# Patient Record
Sex: Female | Born: 1970 | Race: Black or African American | Hispanic: No | Marital: Married | Smoking: Never smoker
Health system: Southern US, Community
[De-identification: ages and names within clinical notes are randomized; demographics above are authoritative.]

## PROBLEM LIST (undated history)

## (undated) DIAGNOSIS — A048 Other specified bacterial intestinal infections: Secondary | ICD-10-CM

## (undated) DIAGNOSIS — I10 Essential (primary) hypertension: Secondary | ICD-10-CM

## (undated) DIAGNOSIS — D649 Anemia, unspecified: Secondary | ICD-10-CM

## (undated) DIAGNOSIS — Z9289 Personal history of other medical treatment: Secondary | ICD-10-CM

## (undated) DIAGNOSIS — R1011 Right upper quadrant pain: Secondary | ICD-10-CM

## (undated) DIAGNOSIS — K760 Fatty (change of) liver, not elsewhere classified: Secondary | ICD-10-CM

## (undated) HISTORY — PX: UPPER GASTROINTESTINAL ENDOSCOPY: SHX188

## (undated) HISTORY — DX: Anemia, unspecified: D64.9

## (undated) HISTORY — DX: Personal history of other medical treatment: Z92.89

## (undated) HISTORY — DX: Other specified bacterial intestinal infections: A04.8

## (undated) HISTORY — DX: Essential (primary) hypertension: I10

## (undated) HISTORY — DX: Fatty (change of) liver, not elsewhere classified: K76.0

## (undated) HISTORY — PX: NO PAST SURGERIES: SHX2092

---

## 2004-12-25 ENCOUNTER — Inpatient Hospital Stay (HOSPITAL_COMMUNITY): Admission: AD | Admit: 2004-12-25 | Discharge: 2004-12-25 | Payer: Self-pay | Admitting: *Deleted

## 2004-12-25 ENCOUNTER — Ambulatory Visit: Payer: Self-pay | Admitting: Certified Nurse Midwife

## 2005-03-05 ENCOUNTER — Inpatient Hospital Stay (HOSPITAL_COMMUNITY): Admission: AD | Admit: 2005-03-05 | Discharge: 2005-03-05 | Payer: Self-pay | Admitting: Obstetrics & Gynecology

## 2005-03-20 ENCOUNTER — Inpatient Hospital Stay (HOSPITAL_COMMUNITY): Admission: AD | Admit: 2005-03-20 | Discharge: 2005-03-23 | Payer: Self-pay | Admitting: Obstetrics & Gynecology

## 2007-05-07 ENCOUNTER — Ambulatory Visit: Payer: Self-pay | Admitting: Cardiology

## 2007-05-07 ENCOUNTER — Encounter: Payer: Self-pay | Admitting: Cardiology

## 2007-05-07 ENCOUNTER — Emergency Department (HOSPITAL_COMMUNITY): Admission: EM | Admit: 2007-05-07 | Discharge: 2007-05-07 | Payer: Self-pay | Admitting: Emergency Medicine

## 2007-05-20 ENCOUNTER — Ambulatory Visit: Payer: Self-pay | Admitting: Cardiology

## 2007-05-20 LAB — CONVERTED CEMR LAB
CO2: 27 meq/L (ref 19–32)
Chloride: 104 meq/L (ref 96–112)
GFR calc Af Amer: 104 mL/min
GFR calc non Af Amer: 86 mL/min
Glucose, Bld: 82 mg/dL (ref 70–99)
Sodium: 138 meq/L (ref 135–145)

## 2008-04-21 ENCOUNTER — Ambulatory Visit: Payer: Self-pay | Admitting: Cardiology

## 2009-01-13 ENCOUNTER — Encounter: Admission: RE | Admit: 2009-01-13 | Discharge: 2009-01-13 | Payer: Self-pay | Admitting: Emergency Medicine

## 2009-04-23 ENCOUNTER — Ambulatory Visit (HOSPITAL_COMMUNITY): Admission: RE | Admit: 2009-04-23 | Discharge: 2009-04-23 | Payer: Self-pay | Admitting: Obstetrics & Gynecology

## 2009-04-29 DIAGNOSIS — R0789 Other chest pain: Secondary | ICD-10-CM | POA: Insufficient documentation

## 2009-04-29 DIAGNOSIS — I10 Essential (primary) hypertension: Secondary | ICD-10-CM | POA: Insufficient documentation

## 2009-05-04 ENCOUNTER — Ambulatory Visit: Payer: Self-pay | Admitting: Cardiology

## 2009-07-20 ENCOUNTER — Ambulatory Visit (HOSPITAL_COMMUNITY): Admission: RE | Admit: 2009-07-20 | Discharge: 2009-07-20 | Payer: Self-pay | Admitting: Obstetrics & Gynecology

## 2009-10-19 ENCOUNTER — Ambulatory Visit (HOSPITAL_COMMUNITY): Admission: RE | Admit: 2009-10-19 | Discharge: 2009-10-19 | Payer: Self-pay | Admitting: Obstetrics & Gynecology

## 2010-03-29 ENCOUNTER — Ambulatory Visit: Payer: Self-pay | Admitting: Cardiology

## 2010-03-29 DIAGNOSIS — E663 Overweight: Secondary | ICD-10-CM | POA: Insufficient documentation

## 2010-03-29 DIAGNOSIS — R002 Palpitations: Secondary | ICD-10-CM | POA: Insufficient documentation

## 2010-04-12 ENCOUNTER — Ambulatory Visit: Payer: Self-pay

## 2010-04-12 ENCOUNTER — Encounter: Payer: Self-pay | Admitting: Cardiology

## 2010-04-12 ENCOUNTER — Ambulatory Visit (HOSPITAL_COMMUNITY): Admission: RE | Admit: 2010-04-12 | Discharge: 2010-04-12 | Payer: Self-pay | Admitting: Cardiology

## 2010-04-12 ENCOUNTER — Ambulatory Visit: Payer: Self-pay | Admitting: Internal Medicine

## 2010-04-14 ENCOUNTER — Encounter: Admission: RE | Admit: 2010-04-14 | Discharge: 2010-04-14 | Payer: Self-pay | Admitting: Internal Medicine

## 2010-06-09 ENCOUNTER — Inpatient Hospital Stay (HOSPITAL_COMMUNITY): Admission: AD | Admit: 2010-06-09 | Discharge: 2009-12-14 | Payer: Self-pay | Admitting: Obstetrics & Gynecology

## 2010-07-24 ENCOUNTER — Encounter: Payer: Self-pay | Admitting: Obstetrics

## 2010-08-02 NOTE — Assessment & Plan Note (Signed)
Summary: rov Lonell Grandchild  Medications Added METHYLDOPA 500 MG TABS (METHYLDOPA) 1 tab two times a day TARON-C DHA 53.5-38-1 MG CAPS (PRENAT-FEFUM-FEPO-FA-OMEGA 3) 1 cap once daily      Allergies Added: NKDA  Visit Type:  rov Primary Provider:  [facility]  CC:  no cardiac complaints today.  History of Present Illness: [name] returns today for treatment of her hypertension. She now has a 24-month-old who is very healthy.  Her biggest complaint is occasional palpitations which occur sporadically. She denies any chest pain in fact her chest pain is improved. She denies orthopnea, PND or edema. She has had no syncope or presyncope.  She still quite overweight.  Current Medications (verified): 1)  Methyldopa 500 Mg Tabs (Methyldopa) .Marland Kitchen.. 1 Tab Two Times A Day 2)  Taron-C Dha 53.5-38-1 Mg Caps (Prenat-Fefum-Fepo-Fa-Omega 3) .Marland Kitchen.. 1 Cap Once Daily  Allergies (verified): No Known Drug Allergies  Past History:  Past Medical History: Last updated: 04/29/2009 CHEST PAIN, ATYPICAL (ICD-786.59) HYPERTENSION (ICD-401.9)    Past Surgical History: Last updated: 04/29/2009 none to report  Family History: Last updated: 04/29/2009 Her mother is alive in her 74s with no history of  coronary artery disease.  Her father died around the time she was born, and she has no information on him.  She has one brother that is living,  and she says he has fluid around his heart.        Social History: Last updated: 04/29/2009 She lives in [place] with her cousin and children  and works as a Engineering geologist.  She denies any history of alcohol,  tobacco, or drug abuse.      Review of Systems       negative other than history of present illness  Vital Signs:  Patient profile:   40 year old female Height:      68 inches Weight:      232.8 pounds BMI:     35.53 Pulse rate:   89 / minute Pulse rhythm:   irregular BP sitting:   130 / 80  (left arm) Cuff size:    large  Vitals Entered By: Danielle Rankin, CMA (March 29, 2010 12:31 PM)  Physical Exam  General:  obese.   Head:  normocephalic and atraumatic Eyes:  PERRLA/EOM intact; conjunctiva and lids normal. Neck:  Neck supple, no JVD. No masses, thyromegaly or abnormal cervical nodes. Chest Blakeleigh Domek:  no deformities or breast masses noted Lungs:  Clear bilaterally to auscultation and percussion. Heart:  PMI difficult to appreciate, normal S1-S2, regular rate and rhythm, no gallop, carotids full without bruits Abdomen:  obese, positive bowel sounds, difficult to assess Msk:  Back normal, normal gait. Muscle strength and tone normal. Pulses:  pulses normal in all 4 extremities Extremities:  No clubbing or cyanosis. no edema Neurologic:  Alert and oriented x 3. Skin:  Intact without lesions or rashes. Psych:  Normal affect.   Problems:  Medical Problems Added: 1)  Dx of Overweight/obesity  (ICD-278.02) 2)  Dx of Palpitations  (ICD-785.1)  EKG  Procedure date:  03/29/2010  Findings:      sinus rhythm with PVCs unifocal, right atrial enlargement, possible left atrial enlargement, low voltage QRS, ST segment changes that are nonspecific but localized in the inferior lateral leads.  Impression & Recommendations:  Problem # 1:  CHEST PAIN, ATYPICAL (ICD-786.59) Assessment Improved  Problem # 2:  OVERWEIGHT/OBESITY (ICD-278.02) Assessment: Unchanged  Orders: EKG w/ Interpretation (93000) Echocardiogram (Echo)  Problem # 3:  PALPITATIONS (ICD-785.1) Assessment: New I suspect these are all PVCs based on her EKG. We'll check echocardiogram. She has normal left ventricular function wiil  treat conservatively. This was explained to the patient and her husband.  Problem # 4:  HYPERTENSION (ICD-401.9)  Her updated medication list for this problem includes:    Methyldopa 500 Mg Tabs (Methyldopa) .Marland Kitchen... 1 tab two times a day  Orders: EKG w/ Interpretation (93000) Echocardiogram  (Echo)  Patient Instructions: 1)  Your physician recommends that you schedule a follow-up appointment in: 12 months 2)  Your physician recommends that you continue on your current medications as directed. Please refer to the Current Medication list given to you today. 3)  Your physician has requested that you have an echocardiogram.  Echocardiography is a painless test that uses sound waves to create images of your heart. It provides your doctor with information about the size and shape of your heart and how well your heart's chambers and valves are working.  This procedure takes approximately one hour. There are no restrictions for this procedure.

## 2010-09-19 LAB — CBC
HCT: 33.2 % — ABNORMAL LOW (ref 36.0–46.0)
Hemoglobin: 11.5 g/dL — ABNORMAL LOW (ref 12.0–15.0)
MCHC: 34.5 g/dL (ref 30.0–36.0)
MCHC: 34.7 g/dL (ref 30.0–36.0)
MCV: 99.6 fL (ref 78.0–100.0)
Platelets: 144 10*3/uL — ABNORMAL LOW (ref 150–400)
Platelets: 170 10*3/uL (ref 150–400)
RBC: 3.17 MIL/uL — ABNORMAL LOW (ref 3.87–5.11)
RDW: 13 % (ref 11.5–15.5)
RDW: 13 % (ref 11.5–15.5)

## 2010-11-15 NOTE — Consult Note (Signed)
[name] [name], [name] [name] NO.:  [id number]   MEDICAL RECORD NO.:  [id number]          PATIENT TYPE:  EMS   LOCATION:  [facility]                         FACILITY:  [facility]   PHYSICIAN:  [name] C. Wall, MD, FACCDATE OF BIRTH:  Jul 16, 1970   DATE OF CONSULTATION:  05/07/2007  DATE OF DISCHARGE:                                 CONSULTATION   ER CONSULT   PRIMARY MD:  [facility].   PRIMARY CARDIOLOGIST:  New.   CHIEF COMPLAINT:  Chest pain.   HISTORY OF PRESENT ILLNESS:  [name] [name] is a 40 year old female from [place]  who had chest pain in January of 2008 and was treated with ibuprofen and  her symptoms resolved.  She had an upper respiratory infection about a  month ago which was again treated with medication including antibiotics,  and her symptoms have improved.  The chest pain began again about a week  ago.  It is relieved by ibuprofen, as it was in January; but it returns  when the ibuprofen wears off.  It reaches a 10/10.  It is described as  sharp.  Last p.m., the symptoms were more bothersome to her and she felt  a little short of breath.  She feels that she did not sleep very much.  She went to Urgent Care this morning, and because of her chest pain and  trigeminy on telemetry she was sent to the emergency room.   The pain is consistent except when she takes the ibuprofen.  It is  unchanged by cough or deep inspiration, but it is better with a prone  position.  She has not tried any other medications.  Currently, she is  resting comfortably.   PAST MEDICAL HISTORY:  1. Hypertension x2 years.  2. Hyperlipidemia per the patient, but no treatment.  3. History of hepatitis B.   SURGICAL HISTORY:  None.   ALLERGIES:  No known drug allergies.   CURRENT MEDICATIONS:  Lisinopril/hydrochlorothiazide 20/12.5 daily.   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  She denies any history of alcohol,  tobacco, or drug abuse.   FAMILY HISTORY:  Her mother is alive in her 75s with no history of  coronary artery disease.  Her father died around the time she was born,  and she has no information on him.  She has one brother that is living,  and she says he has fluid around his heart.   REVIEW OF SYSTEMS:  Since her upper respiratory infection got better,  she has had no fevers, chills, or sweats.  She does not get chronic  headaches.  She has some chronic dyspnea on exertion, but this is not  changed recently.  The chest pain described above.  She does not have  any presyncope or syncope and has never had palpitations despite  frequent PVCs and trigeminy on telemetry.  She has occasional reflux  symptoms and heartburn, but is not currently having those and those are  different from the pain that she has been having.  Full 14-point  review  of systems is otherwise negative.   PHYSICAL EXAM:  VITAL SIGNS:  Temperature is 97.6, blood pressure  132/85, pulse 89, respiratory rate 18, O2 saturation 100% on room air.  GENERAL:  She is a well-developed, obese African female in no acute  distress.  HEENT:  Normal.  NECK:  There is no lymphadenopathy, thyromegaly, bruit, or JVD noted.  CV:  Her heart is regular in rate and rhythm with an S1, S2; and a soft  systolic murmur is noted.  There is no rub and no gallop noted.  Distal  pulses are intact in all 4 extremities, and no femoral bruits are  appreciated.  LUNGS:  Essentially clear to auscultation bilaterally, but she has one  spot on the left side of her chest just above her left breast that is  tender to palpation.  SKIN:  No rashes or lesions are noted.  ABDOMEN:  Soft and nontender with active bowel sounds.  EXTREMITIES:  There is no cyanosis, clubbing, or edema noted.  MUSCULOSKELETAL:  There is no joint deformity or effusion, and no spine  or CVA tenderness is noted.  NEURO:  She is alert and oriented with cranial nerves II through  XII  grossly intact.   Chest x-ray and laboratory values are pending.   EKG is sinus rhythm with PVCs and no acute ischemic changes.  No old is  available for comparison.   IMPRESSION:  1. Chest pain:  Her symptoms are most consistent with musculoskeletal      pain.  We will continue the nonsteroidals.  We will check cardiac      enzymes as well as a sed rate, a TSH, and she will also need an      echocardiogram.  If her symptoms improve with nonsteroidals and her      echocardiogram is without significant pericardial effusion, no      further inpatient workup is indicated at this time and she can be      followed in the office.  2. Trigeminy:  She is on a diuretic for blood pressure control without      potassium supplementation; so we will check a magnesium and a      potassium level.  She has no palpitations, no history of presyncope      or syncope.  Therefore, if her EF is within normal limits, no      further evaluation is indicated.  Metoprolol extended release 25 mg      can be added to her medication regimen, and she can be followed in      the office.      [name] Demark, PA-C      [name] Sans. Daleen Squibb, MD, [facility]  Electronically Signed    RB/MEDQ  D:  05/07/2007  T:  05/08/2007  Job:  [id number]   cc:   [facility]

## 2010-11-15 NOTE — Assessment & Plan Note (Signed)
[facility]                            CARDIOLOGY OFFICE NOTE   NAME:[name], [name]                       MRN:          [id number]  DATE:05/20/2007                            DOB:          April 07, 1971    The patient returns today after being seen in the emergency room with  chest discomfort.  She feels remarkably better after being on ibuprofen  for about 10 days.   She has a history of hypertension.   We obtained a 2D echocardiogram which shows mild left ventricular  hypertrophy with normal left ventricular systolic function.  She had a  trivial pericardial effusion posteriorly which we did not feel was  clinically important.   Regardless, she has gotten better.  Her EKG is normal.   Her biggest complaint is persistent dry cough.  She has been on  antibiotics for a respiratory tract infection which she stopped about a  month ago.   Note that her chest x-ray demonstrated no acute cardiopulmonary disease  when we saw her in the emergency room on May 07, 2007.   Her chronic meds are:  1. Lisinopril/hydrochlorothiazide 20/12.5 daily.  2. Ibuprofen 800 mg p.o. t.i.d.  3. Metoprolol extended release 25 mg daily.   She looks remarkably good today.  She is in no acute distress.  Her blood pressure is 113/83, her pulse is 82 and regular.  HEENT:  Unremarkable and unchanged.  Carotid upstrokes were equal bilaterally without bruits, no JVD.  Thyroid is not enlarged, trachea is midline.  LUNGS:  Clear.  No rhonchi or wheezes.  HEART:  Reveals a poorly appreciated PMI, there is no rub.  Normal S1,  S2.  ABDOMINAL EXAM:  Soft, good bowel sounds.  EXTREMITIES:  Reveal no edema.  Pulses are intact, there is no sign of  DVT.   ASSESSMENT AND PLAN:  1. Chest discomfort most likely musculoskeletal, resolved now after 10      to 12 days of ibuprofen.  We will discontinue the ibuprofen.  2. Hypertension with mild left ventricular hypertrophy.  Her  pressure      is good today.  3. Persistent, dry cough.  If this continues I have advised her to      follow up with the [facility] for possible      change of her lisinopril to a different agent.   Please note that her potassium was 3.4 in emergency room.  Will check a  potassium level today.  I will see her back on a p.r.n. basis.     Thomas C. Daleen Squibb, MD, [facility]  Electronically Signed    TCW/MedQ  DD: 05/20/2007  DT: 05/21/2007  Job #: [id number]

## 2010-11-15 NOTE — Assessment & Plan Note (Signed)
[facility]                            CARDIOLOGY OFFICE NOTE   NAME:[name], [name]                       MRN:          [id number]  DATE:04/21/2008                            DOB:          [birth year]    [name] [name] returns today for followup of her hypertension and history of  atypical chest pain.  She has no complaints except for some sharp  stabbing pain when she walks too fast.  It is well localized on left  sternal border just above her left breast.  She denies any heaviness,  pressure, shortness of breath, or radiation into her shoulder or arm.   She is currently taking lisinopril/hydrochlorothiazide 20/12.5 mg daily  and metoprolol extended release 25 mg a day.   Her blood pressure today is 108/80, her pulse is 72 and regular, weight  is 222.  HEENT is normal.  Neck was difficult to assess JVD.  Carotids  upstrokes were equal bilateral without bruits.  No thyromegaly.  Her  lungs are clear to auscultation or percussion.  Heart reveals soft S1  and S2.  PMI could not be appreciated.  Abdominal exam is obese.  Extremities show no edema.  Pulses are present.  Neuro exam is intact.   ASSESSMENT AND PLAN:  [name] [name] is doing well.  I have made no changes  in medical program.  We renewed her antihypertensives, which seem to be  doing really well for another year.  We will see her back at that time.     Thomas C. Daleen Squibb, MD, [facility]  Electronically Signed    TCW/MedQ  DD: 04/21/2008  DT: 04/22/2008  Job #: [id number]

## 2011-04-11 LAB — CK TOTAL AND CKMB (NOT AT ARMC)
CK, MB: 2.8
Relative Index: 1.2
Total CK: 239 — ABNORMAL HIGH

## 2011-04-11 LAB — COMPREHENSIVE METABOLIC PANEL
ALT: 19
AST: 21
Albumin: 3.8
Alkaline Phosphatase: 62
BUN: 6
CO2: 25
Calcium: 9.2
Chloride: 106
Creatinine, Ser: 0.66
GFR calc Af Amer: >60
GFR calc non Af Amer: >60
Glucose, Bld: 79
Potassium: 3.4 — ABNORMAL LOW
Sodium: 141
Total Bilirubin: 0.7
Total Protein: 7.2

## 2011-04-11 LAB — DIFFERENTIAL
Basophils Absolute: 0
Basophils Relative: 0
Eosinophils Absolute: 0
Monocytes Relative: 4
Neutro Abs: 4
Neutrophils Relative %: 72

## 2011-04-11 LAB — TROPONIN I: Troponin I: 0.03

## 2011-04-11 LAB — LIPID PANEL
Cholesterol: 186
LDL Cholesterol: 103 — ABNORMAL HIGH
Total CHOL/HDL Ratio: 2.5

## 2011-04-11 LAB — CBC
HCT: 35.7 — ABNORMAL LOW
Hemoglobin: 12
MCHC: 33.6
MCV: 92.2
Platelets: 206
RBC: 3.87
RDW: 11.9
WBC: 5.5

## 2011-04-11 LAB — TSH: TSH: 0.092 — ABNORMAL LOW

## 2011-04-11 LAB — D-DIMER, QUANTITATIVE: D-Dimer, Quant: <0.22

## 2011-04-11 LAB — SEDIMENTATION RATE: Sed Rate: 20

## 2011-05-16 ENCOUNTER — Ambulatory Visit: Payer: Self-pay | Admitting: Cardiology

## 2011-05-24 ENCOUNTER — Encounter: Payer: Self-pay | Admitting: *Deleted

## 2011-05-30 ENCOUNTER — Encounter: Payer: Self-pay | Admitting: Cardiology

## 2011-05-30 ENCOUNTER — Ambulatory Visit (INDEPENDENT_AMBULATORY_CARE_PROVIDER_SITE_OTHER): Payer: 59 | Admitting: Cardiology

## 2011-05-30 VITALS — BP 172/118 | HR 83 | Ht 65.0 in | Wt 245.0 lb

## 2011-05-30 DIAGNOSIS — I10 Essential (primary) hypertension: Secondary | ICD-10-CM

## 2011-05-30 MED ORDER — METOPROLOL TARTRATE 25 MG PO TABS: 25.0000 mg | ORAL_TABLET | Freq: Two times a day (BID) | ORAL | Status: DC

## 2011-05-30 NOTE — Assessment & Plan Note (Signed)
This is the most significant problem. Diastolic I'm talking to her about the risk of diabetes, other end organ damage from poor blood pressure control as well as orthopedic issues.  We have given her a low-carb diet, encouraged to lose as much weight as possible and keep it off, Octavion Mollenkopf daily as she is doing, and 2 g sodium diet to help her blood pressures well.

## 2011-05-30 NOTE — Assessment & Plan Note (Signed)
Poorly controlled. 2 g sodium diet, continual daily, major weight loss, and increase metoprolol tartrate 2 inappropriate 25 mg twice a day frequency dosing. She will probably need further adjustment of blood pressure meds with follow up with  her primary care Dr. Alwyn Ren. She has been advised to buy a large blood pressure cuff to measure blood pressures at home.

## 2011-05-30 NOTE — Patient Instructions (Addendum)
Your physician has recommended you make the following change in your medication:  Increase Metoprolol  Follow-up with Dr. Alwyn Ren for better blood pressure control  Your physician has requested that you regularly monitor and record your blood pressure readings at home. Please use the same machine at the same time of day to check your readings and record them to bring to your follow-up visit. You will need to use a large blood pressure cuff for a more accurate reading.  Your physician encouraged you to lose weight for better health. A weight loss of 1-2 pounds per week until you reach your ideal weight.  Reduce your carbohydrates especially those found in rice, bread, pastas and desserts  Reduce your sodium ( Salt) content in your diet.  This will help in controlling your blood pressure.    Basic Carbohydrate Counting Basic carbohydrate counting is a way to plan meals. It is done by counting the amount of carbohydrate in foods. Eating carbohydrates increases blood glucose (sugar) levels. People with diabetes use carbohydrate counting to help keep their blood glucose at a normal level.  Foods that have carbohydrates are starches (grains, beans, starchy vegetables) and sweets.  COUNTING CARBOHYDRATES IN FOODS The first step in counting carbohydrates is to learn how many carbohydrate servings you should have in every meal. A dietitian can plan this for you. After learning the amount of carbohydrates to include in your meal plan, you can start to choose the carbohydrate-containing foods you want to eat.  There are 2 ways to identify the amount of carbohydrates in the foods you eat.  Read the Nutrition Facts panel on food labels. All you need are 2 pieces of information from the Nutrition Facts panel to count carbohydrates this way:   Serving size.   Total carbohydrate (in grams).  Decide how many servings you will be eating. If it is 1 serving, you will be eating the amount of carbohydrate  listed on the panel. If you will be eating 2 servings, you will be eating double the amount of carbohydrate listed on the panel.   Learn serving sizes. A serving size of most carbohydrate-containing foods is about 15 g. Listed below are serving sizes of common carbohydrate-containing foods:   1 slice bread.    cup unsweetened, dry cereal.    cup hot cereal.   ? cup rice.    cup mashed potatoes.   ? cup pasta.   1 cup fresh fruit.    cup canned fruit.   1 cup milk (whole, 2%, or skim).    cup starchy vegetables (peas, corn, or potatoes).  Counting carbohydrates this way is similar to looking on the Nutrition Facts panel. Decide how many servings you will eat first. Multiply the number of servings you eat by 15 g. For example, if you have 2 cups of strawberries, you had 2 servings. That means you had 30 g of carbohydrate (2 servings x 15 g = 30 g). CALCULATING CARBOHYDRATES IN A MEAL Sample dinner  3 oz chicken breast.   ? cup brown rice.    cup corn.   1 cup fat-free milk.   1 cup strawberries with sugar-free whipped topping.  Carbohydrate calculation First, identify the foods that contain carbohydrate:  Rice.   Corn.   Milk.   Strawberries.  Calculate the number of servings eaten:  2 servings rice.   1 serving corn.   1 serving milk.   1 serving strawberries.  Multiply the number of servings by 15 g:  2 servings rice x 15 g = 30 g.   1 serving corn x 15 g = 15 g.   1 serving milk x 15 g = 15 g.   1 serving strawberries x 15 g = 15 g.  Add the amounts to find the total carbohydrates eaten: 30 g + 15 g + 15 g + 15 g = 75 g carbohydrate eaten at dinner. Document Released: 06/19/2005 Document Revised: 02/15/2011 Document Reviewed: 11/05/2006 [employer] Patient Information 2012 [employer].2 Gram Low Sodium Diet   A 2 gram sodium diet restricts the amount of sodium in the diet to no more than 2 g or 2000 mg daily. Limiting the amount of sodium  is often used to help lower blood pressure. It is important if you have heart, liver, or kidney problems. Many foods contain sodium for flavor and sometimes as a preservative. When the amount of sodium in a diet needs to be low, it is important to know what to look for when choosing foods and drinks. The following includes some information and guidelines to help make it easier for you to adapt to a low sodium diet. QUICK TIPS  Do not add salt to food.   Avoid convenience items and fast food.   Choose unsalted snack foods.   Buy lower sodium products, often labeled as "lower sodium" or "no salt added."   Check food labels to learn how much sodium is in 1 serving.   When eating at a restaurant, ask that your food be prepared with less salt or none, if possible.  READING FOOD LABELS FOR SODIUM INFORMATION The nutrition facts label is a good place to find how much sodium is in foods. Look for products with no more than 500 to 600 mg of sodium per meal and no more than 150 mg per serving. Remember that 2 g = 2000 mg. The food label may also list foods as:  Sodium-free: Less than 5 mg in a serving.   Very low sodium: 35 mg or less in a serving.   Low-sodium: 140 mg or less in a serving.   Light in sodium: 50% less sodium in a serving. For example, if a food that usually has 300 mg of sodium is changed to become light in sodium, it will have 150 mg of sodium.   Reduced sodium: 25% less sodium in a serving. For example, if a food that usually has 400 mg of sodium is changed to reduced sodium, it will have 300 mg of sodium.  CHOOSING FOODS Grains  Avoid: Salted crackers and snack items. Some cereals, including instant hot cereals. Bread stuffing and biscuit mixes. Seasoned rice or pasta mixes.   Choose: Unsalted snack items. Low-sodium cereals, oats, puffed wheat and rice, shredded wheat. English muffins and bread. Pasta.  Meats  Avoid: Salted, canned, smoked, spiced, pickled meats,  including fish and poultry. Bacon, ham, sausage, cold cuts, hot dogs, anchovies.   Choose: Low-sodium canned tuna and salmon. Fresh or frozen meat, poultry, and fish.  Dairy  Avoid: Processed cheese and spreads. Cottage cheese. Buttermilk and condensed milk. Regular cheese.   Choose: Milk. Low-sodium cottage cheese. Yogurt. Sour cream. Low-sodium cheese.  Fruits and Vegetables  Avoid: Regular canned vegetables. Regular canned tomato sauce and paste. Frozen vegetables in sauces. Olives. Rosita Fire. Relishes. Sauerkraut.   Choose: Low-sodium canned vegetables. Low-sodium tomato sauce and paste. Frozen or fresh vegetables. Fresh and frozen fruit.  Condiments  Avoid: Canned and packaged gravies. Worcestershire sauce. Tartar sauce. Barbecue  sauce. Soy sauce. Steak sauce. Ketchup. Onion, garlic, and table salt. Meat flavorings and tenderizers.   Choose: Fresh and dried herbs and spices. Low-sodium varieties of mustard and ketchup. Lemon juice. Tabasco sauce. Horseradish.  SAMPLE 2 GRAM SODIUM MEAL PLAN Breakfast / Sodium (mg)  1 cup low-fat milk / 143 mg   2 slices whole-wheat toast / 270 mg   1 tbs heart-healthy margarine / 153 mg   1 hard-boiled egg / 139 mg   1 small orange / 0 mg  Lunch / Sodium (mg)  1 cup raw carrots / 76 mg    cup hummus / 298 mg   1 cup low-fat milk / 143 mg    cup red grapes / 2 mg   1 whole-wheat pita bread / 356 mg  Dinner / Sodium (mg)  1 cup whole-wheat pasta / 2 mg   1 cup low-sodium tomato sauce / 73 mg   3 oz lean ground beef / 57 mg   1 small side salad (1 cup raw spinach leaves,  cup cucumber,  cup yellow bell pepper) with 1 tsp olive oil and 1 tsp red wine vinegar / 25 mg  Snack / Sodium (mg)  1 container low-fat vanilla yogurt / 107 mg   3 graham cracker squares / 127 mg  Nutrient Analysis  Calories: 2033   Protein: 77 g   Carbohydrate: 282 g   Fat: 72 g   Sodium: 1971 mg  Document Released: 06/19/2005 Document Revised:  03/01/2011 Document Reviewed: 09/20/2009 [facility] Patient Information 2012 [place], [place].

## 2011-05-30 NOTE — Progress Notes (Signed)
HPI [name] [name] Comes in today for followup for history of atypical chest pain and palpitations. Both of these issues or not a problem at present.  Her biggest problem is her weight which continues to go up. She is now in the morbid obesity category. Her blood pressures also very high today despite taking her meds this morning. She's only on once a day low-dose metoprolol.  She saw Dr. Alwyn Ren her primary care physician last week. She does not recall what her blood pressure was that day. Was treated with an anti-inflammatory or some right ankle pain. She states her blood work is followed by Dr. Alwyn Ren.  She denies orthopnea, PND or edema. Complains of generalized Fatigue and exercise intolerance.  Past Medical History  Diagnosis Date  . Other chest pain   . Unspecified essential hypertension     Current Outpatient Prescriptions  Medication Sig Dispense Refill  . meloxicam (MOBIC) 15 MG tablet Take 15 mg by mouth daily.        . metoprolol tartrate (LOPRESSOR) 25 MG tablet Take 25 mg by mouth daily.          No Known Allergies  Family History  Problem Relation Age of Onset  . Coronary artery disease Mother   . Other Brother     fluid around heart    History   Social History  . Marital Status: Married    Spouse Name: N/A    Number of Children: N/A  . Years of Education: N/A   Occupational History  . Housekeeper     at hotel   Social History Main Topics  . Smoking status: Never Smoker   . Smokeless tobacco: Never Used  . Alcohol Use: No  . Drug Use: No  . Sexually Active: Not on file   Other Topics Concern  . Not on file   Social History Narrative  . No narrative on file    ROS ALL NEGATIVE EXCEPT THOSE NOTED IN HPI  PE  General Appearance: well developed, well nourished in no acute distress, morbidly obese HEENT: symmetrical face, PERRLA, good dentition  Neck: no JVD, thyromegaly, or adenopathy, trachea midline Chest: symmetric without deformity Cardiac: PMI  Not appreciated, soft S1, S2, no gallop or murmur Lung: clear to ausculation and percussion Vascular: all pulses full without bruits  Abdominal: nondistended, nontender, good bowel sounds, no HSM, no bruits Extremities: no cyanosis, clubbing, 1+ pitting edema,no sign of DVT, no varicosities  Skin: normal color, no rashes Neuro: alert and oriented x 3, non-focal Pysch: normal affect  EKG Normal sinus rhythm, unifocal PVCs, no ST segment changes BMET    Component Value Date/Time   NA 138 05/20/2007 1457   K 3.7 05/20/2007 1457   CL 104 05/20/2007 1457   CO2 27 05/20/2007 1457   GLUCOSE 82 05/20/2007 1457   BUN 10 05/20/2007 1457   CREATININE 0.8 05/20/2007 1457   CALCIUM 9.5 05/20/2007 1457   GFRNONAA 86 05/20/2007 1457   GFRAA 104 05/20/2007 1457    Lipid Panel     Component Value Date/Time   CHOL  Value: 186        ATP III CLASSIFICATION:  <200     mg/dL   Desirable  409-811  mg/dL   Borderline High  >=914    mg/dL   High 78/08/9560 1308   TRIG 46 05/07/2007 1126   HDL 74 05/07/2007 1126   CHOLHDL 2.5 05/07/2007 1126   VLDL 9 05/07/2007 1126   LDLCALC  Value: 103  Total Cholesterol/HDL:CHD Risk Coronary Heart Disease Risk Table                     Men   Women  1/2 Average Risk   3.4   3.3* 05/07/2007 1126    CBC    Component Value Date/Time   WBC 9.7 12/13/2009 0516   RBC 3.17* 12/13/2009 0516   HGB 10.9* 12/13/2009 0516   HCT 31.5* 12/13/2009 0516   PLT 144* 12/13/2009 0516   MCV 99.6 12/13/2009 0516   MCHC 34.5 12/13/2009 0516   RDW 13.0 12/13/2009 0516   LYMPHSABS 1.3 05/07/2007 1220   MONOABS 0.2 05/07/2007 1220   EOSABS 0.0 05/07/2007 1220   BASOSABS 0.0 05/07/2007 1220

## 2011-07-08 ENCOUNTER — Ambulatory Visit (INDEPENDENT_AMBULATORY_CARE_PROVIDER_SITE_OTHER): Payer: 59

## 2011-07-08 DIAGNOSIS — E669 Obesity, unspecified: Secondary | ICD-10-CM

## 2011-07-08 DIAGNOSIS — R109 Unspecified abdominal pain: Secondary | ICD-10-CM

## 2011-07-08 DIAGNOSIS — I10 Essential (primary) hypertension: Secondary | ICD-10-CM

## 2011-08-02 ENCOUNTER — Ambulatory Visit (INDEPENDENT_AMBULATORY_CARE_PROVIDER_SITE_OTHER): Payer: 59 | Admitting: Family Medicine

## 2011-08-02 VITALS — BP 132/84 | HR 78 | Temp 99.0°F | Resp 16 | Ht 65.0 in | Wt 232.0 lb

## 2011-08-02 DIAGNOSIS — M545 Low back pain, unspecified: Secondary | ICD-10-CM

## 2011-08-02 MED ORDER — CYCLOBENZAPRINE HCL 10 MG PO TABS: ORAL_TABLET | ORAL | Status: DC

## 2011-08-02 MED ORDER — PREDNISONE 20 MG PO TABS: 40.0000 mg | ORAL_TABLET | Freq: Every day | ORAL | Status: AC

## 2011-08-02 NOTE — Progress Notes (Signed)
  Subjective:    Patient ID: [name] [name], female    DOB: 07-04-1970, 41 y.o.   MRN: [id number]  Back Pain This is a new problem. The current episode started in the past 7 days. The problem occurs intermittently. The problem has been gradually worsening since onset. The pain is present in the lumbar spine. The quality of the pain is described as aching. The pain does not radiate. The pain is moderate. The pain is worse during the night. The symptoms are aggravated by bending. Stiffness is present all day. Associated symptoms include headaches. Risk factors include obesity. She has tried analgesics for the symptoms. The treatment provided moderate relief.  Headache  Associated symptoms include back pain.   Patient works in housekeeping at hotel   Review of Systems  Constitutional: Negative.   Musculoskeletal: Positive for back pain.  Neurological: Positive for headaches.  All other systems reviewed and are negative.       Objective:   Physical Exam  Constitutional: She is oriented to person, place, and time. She appears well-developed and well-nourished.  HENT:  Head: Normocephalic and atraumatic.  Right Ear: External ear normal.  Left Ear: External ear normal.  Eyes: Conjunctivae and EOM are normal. Pupils are equal, round, and reactive to light.  Neck: Normal range of motion. Neck supple.  Cardiovascular: Normal rate.   Pulmonary/Chest: Effort normal.  Musculoskeletal: Normal range of motion.  Neurological: She is alert and oriented to person, place, and time. She has normal reflexes.  Skin: Skin is warm and dry.          Assessment & Plan:  Patient was recently seen for a flank pain problem.  Her work is strenuous and her life is not easy.  I suspect the back pain and headache are related to strenuous and tedious work.

## 2011-08-02 NOTE — Patient Instructions (Signed)

## 2012-02-01 ENCOUNTER — Ambulatory Visit (INDEPENDENT_AMBULATORY_CARE_PROVIDER_SITE_OTHER): Payer: 59 | Admitting: Physician Assistant

## 2012-02-01 VITALS — BP 140/88 | HR 62 | Temp 98.0°F | Resp 16 | Ht 64.0 in | Wt 219.0 lb

## 2012-02-01 DIAGNOSIS — H6690 Otitis media, unspecified, unspecified ear: Secondary | ICD-10-CM

## 2012-02-01 DIAGNOSIS — H6980 Other specified disorders of Eustachian tube, unspecified ear: Secondary | ICD-10-CM

## 2012-02-01 MED ORDER — AMOXICILLIN 500 MG PO CAPS: ORAL_CAPSULE | ORAL | Status: DC

## 2012-02-01 MED ORDER — FLUTICASONE PROPIONATE 50 MCG/ACT NA SUSP: 2.0000 | Freq: Every day | NASAL | Status: DC

## 2012-02-01 NOTE — Patient Instructions (Addendum)
mucinex and ibuprofen

## 2012-02-01 NOTE — Progress Notes (Signed)
  Subjective:    Patient ID: [name] [name], female    DOB: May 23, 1971, 41 y.o.   MRN: [id number]  HPI 41 yo AAF presents with 3 day history of L ear pain.  Not improving.  Slight nasal congestion. L side of neck along eustachian tube is tender. (Not sore throat).  No headache, runny nose, sneezing, or coughing. No f/c.  Review of Systems  All other systems reviewed and are negative.       Objective:   Physical Exam  Nursing note and vitals reviewed. Constitutional: She is oriented to person, place, and time. She appears well-developed and well-nourished.  HENT:  Head: Normocephalic and atraumatic.  Right Ear: External ear normal.  Left Ear: External ear normal.  Mouth/Throat: Oropharynx is clear and moist. No oropharyngeal exudate (TTP along L eustachian tube. ).       R TM with fluid but no infection.  L TM bulging and erythematous. Turbinates pale, boggy, and enlarged  Neck: Normal range of motion. Neck supple. No thyromegaly present.  Cardiovascular: Normal rate, regular rhythm and normal heart sounds.   Pulmonary/Chest: Effort normal and breath sounds normal.  Lymphadenopathy:    She has no cervical adenopathy.  Neurological: She is alert and oriented to person, place, and time.  Skin: Skin is warm and dry.          Assessment & Plan:  L AOM Eustachian tube dysfunction-see Rxs

## 2012-03-30 ENCOUNTER — Ambulatory Visit (INDEPENDENT_AMBULATORY_CARE_PROVIDER_SITE_OTHER): Payer: 59 | Admitting: Family Medicine

## 2012-03-30 VITALS — BP 154/98 | HR 63 | Temp 98.0°F | Resp 17 | Ht 64.5 in | Wt 217.0 lb

## 2012-03-30 DIAGNOSIS — R42 Dizziness and giddiness: Secondary | ICD-10-CM

## 2012-03-30 DIAGNOSIS — R599 Enlarged lymph nodes, unspecified: Secondary | ICD-10-CM

## 2012-03-30 DIAGNOSIS — R51 Headache: Secondary | ICD-10-CM

## 2012-03-30 DIAGNOSIS — H6690 Otitis media, unspecified, unspecified ear: Secondary | ICD-10-CM

## 2012-03-30 DIAGNOSIS — R591 Generalized enlarged lymph nodes: Secondary | ICD-10-CM

## 2012-03-30 MED ORDER — KETOROLAC TROMETHAMINE 60 MG/2ML IM SOLN
60.0000 mg | Freq: Once | INTRAMUSCULAR | Status: AC
  Administered 2012-03-30: 60 mg via INTRAMUSCULAR

## 2012-03-30 MED ORDER — AMOXICILLIN 875 MG PO TABS: 875.0000 mg | ORAL_TABLET | Freq: Two times a day (BID) | ORAL | Status: DC

## 2012-03-30 MED ORDER — BUTALBITAL-APAP-CAFFEINE 50-325-40 MG PO TABS: 1.0000 | ORAL_TABLET | Freq: Four times a day (QID) | ORAL | Status: DC | PRN

## 2012-03-30 NOTE — Progress Notes (Signed)
[facility]:  Office Visit  Chief Complaint:  Chief Complaint  Patient presents with  . Headache    2 weeks   . Neck Pain    2 weeks   . Dizziness    2 weeks     HPI: [name] [name] is a 41 y.o. female who complains of  Bilateral ear pain, HA and LAD, throat pain. Has a h/o HA with sinus infection. She has had ear pain and this has been followed by HA for the last 2 weeks. She takes ibuprofen and that seems to help relieve the HA but it never fully goes away, it is located behind her sinuses and frontal area. She works as Advertising copywriter at [employer] and has not had time to rest. + dizziness without CP, SOB,, photophobia, nausea, vomiting, gait changes, confusion.   Past Medical History  Diagnosis Date  . Other chest pain   . Unspecified essential hypertension    No past surgical history on file. History   Social History  . Marital Status: Married    Spouse Name: N/A    Number of Children: N/A  . Years of Education: N/A   Occupational History  . Housekeeper     at hotel   Social History Main Topics  . Smoking status: Never Smoker   . Smokeless tobacco: Never Used  . Alcohol Use: No  . Drug Use: No  . Sexually Active: None   Other Topics Concern  . None   Social History Narrative  . None   Family History  Problem Relation Age of Onset  . Coronary artery disease Mother   . Other Brother     fluid around heart   No Known Allergies Prior to Admission medications   Medication Sig Start Date End Date Taking? Authorizing Provider  fluticasone (FLONASE) 50 MCG/ACT nasal spray Place 2 sprays into the nose daily. 02/01/12 01/31/13 Yes Anders Simmonds, PA-C  meloxicam (MOBIC) 15 MG tablet Take 15 mg by mouth daily.     Yes Historical Provider, MD  metoprolol tartrate (LOPRESSOR) 25 MG tablet Take 1 tablet (25 mg total) by mouth 2 (two) times daily. 05/30/11  Yes Gaylord Shih, MD  amoxicillin (AMOXIL) 500 MG capsule 2 tabs bid 02/01/12   Anders Simmonds, PA-C  cyclobenzaprine (FLEXERIL) 10 MG tablet Take one each evening after work 08/02/11   Elvina Sidle, MD     ROS: The patient denies fevers, chills, night sweats, unintentional weight loss, chest pain, palpitations, wheezing, dyspnea on exertion, nausea, vomiting, abdominal pain, dysuria, hematuria, melena, numbness, weakness, or tingling.   All other systems have been reviewed and were otherwise negative with the exception of those mentioned in the HPI and as above.    PHYSICAL EXAM: Filed Vitals:   03/30/12 1734  BP: 154/98  Pulse: 63  Temp: 98 F (36.7 C)  Resp: 17   Filed Vitals:   03/30/12 1734  Height: 5' 4.5" (1.638 m)  Weight: 217 lb (98.431 kg)   Body mass index is 36.67 kg/(m^2).  General: Alert, no acute distress, tired appearing HEENT:  Normocephalic, atraumatic, oropharynx patent. Right TM red, boggy. EOMI, PERRLA, fundoscopic exam nl.  Cardiovascular:  Regular rate and rhythm, no rubs murmurs or gallops.  No Carotid bruits, radial pulse intact. No pedal edema.  Respiratory: Clear to auscultation bilaterally.  No wheezes, rales, or rhonchi.  No cyanosis, no use of accessory musculature GI: No organomegaly, abdomen is soft and non-tender, positive bowel  sounds.  No masses. Skin: No rashes. Neurologic: Facial musculature symmetric.No meningeal sxs/signs Psychiatric: Patient is appropriate throughout our interaction. Lymphatic: No cervical lymphadenopathy Musculoskeletal: Gait intact.Neck ROm intact full AROM/PROM without pain   LABS:    EKG/XRAY:   Primary read interpreted by Dr. Conley Rolls at [facility].   ASSESSMENT/PLAN: Encounter Diagnoses  Name Primary?  . Otitis media Yes  . LAD (lymphadenopathy)   . Headache    Rx Toradol injection in house, Fioricet, and Amoxacillin If HA persist then need to get re-evaluated Recommend BP cuff for BP measurement. BP goal is <140/90 CBC pending for dizziness and HA and OM, h/o anemia   Authur Cubit PHUONG,  DO 03/30/2012 6:20 PM

## 2012-04-01 ENCOUNTER — Encounter: Payer: Self-pay | Admitting: Family Medicine

## 2012-04-01 ENCOUNTER — Telehealth: Payer: Self-pay | Admitting: Family Medicine

## 2012-04-01 ENCOUNTER — Telehealth: Payer: Self-pay

## 2012-04-01 NOTE — Telephone Encounter (Signed)
Pt is returning our call  Best number [phone]

## 2012-04-01 NOTE — Telephone Encounter (Signed)
I called patient today to ask how she is doing. She did not get her Fioricet rx for whatever reason but was able to get her Amoxacillin rx. She needs also to get a CBC which was not ordered at the time of the visit. I would like her to get it today. I have asked her to come in to get a CBC which I have already ordered, also it would be best if she gets a printed rx of her fioricet. I do not know what happened with that rx when she went to the pharmacy.

## 2012-04-02 ENCOUNTER — Telehealth: Payer: Self-pay | Admitting: Radiology

## 2012-04-02 ENCOUNTER — Ambulatory Visit (INDEPENDENT_AMBULATORY_CARE_PROVIDER_SITE_OTHER): Payer: 59 | Admitting: Family Medicine

## 2012-04-02 DIAGNOSIS — R51 Headache: Secondary | ICD-10-CM

## 2012-04-02 DIAGNOSIS — R599 Enlarged lymph nodes, unspecified: Secondary | ICD-10-CM

## 2012-04-02 DIAGNOSIS — H6690 Otitis media, unspecified, unspecified ear: Secondary | ICD-10-CM

## 2012-04-02 DIAGNOSIS — R42 Dizziness and giddiness: Secondary | ICD-10-CM

## 2012-04-02 LAB — POCT CBC
Granulocyte percent: 44.5 % (ref 37–80)
HCT, POC: 39.4 % (ref 37.7–47.9)
Hemoglobin: 12.1 g/dL — AB (ref 12.2–16.2)
Lymph, poc: 2.5 (ref 0.6–3.4)
MCH, POC: 30.1 pg (ref 27–31.2)
MCHC: 30.7 g/dL — AB (ref 31.8–35.4)
MCV: 98.1 fL — AB (ref 80–97)
MID (cbc): 0.4 (ref 0–0.9)
MPV: 13.3 fL (ref 0–99.8)
POC Granulocyte: 2.3 (ref 2–6.9)
POC LYMPH PERCENT: 48.3 %L (ref 10–50)
POC MID %: 7.2 % (ref 0–12)
Platelet Count, POC: 158 10*3/uL (ref 142–424)
RBC: 4.02 M/uL — AB (ref 4.04–5.48)
RDW, POC: 12.9 %
WBC: 5.1 10*3/uL (ref 4.6–10.2)

## 2012-04-02 NOTE — Telephone Encounter (Signed)
Rx called into [facility].

## 2012-04-02 NOTE — Telephone Encounter (Signed)
Called and everything is taken care of. Patient will be here today for labs, around 430

## 2012-04-02 NOTE — Telephone Encounter (Signed)
Please call the Fioricet in for her to [place],

## 2012-04-25 ENCOUNTER — Other Ambulatory Visit: Payer: Self-pay | Admitting: *Deleted

## 2012-04-25 DIAGNOSIS — I10 Essential (primary) hypertension: Secondary | ICD-10-CM

## 2012-04-25 MED ORDER — METOPROLOL TARTRATE 25 MG PO TABS: 25.0000 mg | ORAL_TABLET | Freq: Two times a day (BID) | ORAL | Status: DC

## 2012-05-05 ENCOUNTER — Ambulatory Visit (INDEPENDENT_AMBULATORY_CARE_PROVIDER_SITE_OTHER): Payer: 59 | Admitting: Internal Medicine

## 2012-05-05 VITALS — BP 162/91 | HR 77 | Temp 98.1°F | Resp 16 | Ht 64.5 in | Wt 218.0 lb

## 2012-05-05 DIAGNOSIS — Z23 Encounter for immunization: Secondary | ICD-10-CM

## 2012-05-05 DIAGNOSIS — M25519 Pain in unspecified shoulder: Secondary | ICD-10-CM

## 2012-05-05 DIAGNOSIS — Z1231 Encounter for screening mammogram for malignant neoplasm of breast: Secondary | ICD-10-CM

## 2012-05-05 DIAGNOSIS — I10 Essential (primary) hypertension: Secondary | ICD-10-CM

## 2012-05-05 LAB — POCT CBC
HCT, POC: 40 % (ref 37.7–47.9)
Lymph, poc: 2.5 (ref 0.6–3.4)
MCH, POC: 30.5 pg (ref 27–31.2)
MCHC: 31.3 g/dL — AB (ref 31.8–35.4)
POC Granulocyte: 2.4 (ref 2–6.9)
POC LYMPH PERCENT: 47.7 %L (ref 10–50)
POC MID %: 7.8 %M (ref 0–12)
RDW, POC: 13.2 %
WBC: 5.3 10*3/uL (ref 4.6–10.2)

## 2012-05-05 MED ORDER — METOPROLOL TARTRATE 25 MG PO TABS: 25.0000 mg | ORAL_TABLET | Freq: Two times a day (BID) | ORAL | Status: DC

## 2012-05-05 MED ORDER — MELOXICAM 15 MG PO TABS: 15.0000 mg | ORAL_TABLET | Freq: Every day | ORAL | Status: DC

## 2012-05-05 MED ORDER — HYDROCHLOROTHIAZIDE 12.5 MG PO CAPS: 12.5000 mg | ORAL_CAPSULE | Freq: Every day | ORAL | Status: DC

## 2012-05-05 NOTE — Progress Notes (Signed)
  Subjective:    Patient ID: [name] [name], female    DOB: 01-Sep-1970, 41 y.o.   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 HYPERTENSION  . Obesity, morbid (more than 100 lbs over ideal weight or BMI > 40)      Review of Systems     Objective:   Physical Exam General: 41 yo AA overweight F who does not natively speak English is pleasant and cooperative w/exam. Vitals: BMI = 36.84 Filed Vitals:   05/05/12 1540  BP: 162/91  Pulse: 77  Temp: 98.1 F (36.7 C)  Resp: 16  HEENT: Nontraumatic, EOMIT, Normal to external exam, trachea midline Heart: Regular rate no M Lungs: No acute respiratory distress, no audible wheezing MSK: Normal bulk and tone. R UE - no swelling, bruising, or deformity.  Tender to palp at [facility] jt and      scapula, Full active ROM, pain w/ER R shoulder, strength 5/5 and pain w/testing R shoulder flex/aBD. Cervical Region - no swelling, brusing, or deformity, NT to palpation, Full ROM, pain w/flexion+R rot Neuro: Alert, oriented, CN II - XII, decreased sensation at C5 and T1 dermatome on R UE otherwise IT bilaterally, L UE reflexes 2/4, Unable to elicit R UE reflexes. Extrem -no edema    Results for orders placed in visit on 05/05/12  POCT CBC      Component Value Range   WBC 5.3  4.6 - 10.2 K/uL   Lymph, poc 2.5  0.6 - 3.4   POC LYMPH PERCENT 47.7  10 - 50 %L   MID (cbc) 0.4  0 - 0.9   POC MID % 7.8  0 - 12 %M   POC Granulocyte 2.4  2 - 6.9   Granulocyte percent 44.5  37 - 80 %G   RBC 4.10  4.04 - 5.48 M/uL   Hemoglobin 12.5  12.2 - 16.2 g/dL   HCT, POC 84.6  96.2 - 47.9 %   MCV 97.5 (*) 80 - 97 fL   MCH, POC 30.5  27 - 31.2 pg   MCHC 31.3 (*) 31.8 - 35.4 g/dL   RDW, POC 95.2     Platelet Count, POC 180  142 - 424 K/uL   MPV 12.2  0 - 99.8  fL       Assessment & Plan:  1) R shoulder strain vs sprain? -   melox plus exercises 3-4 weeks 2) HTN -uncontrolled-- refill Lopressor 25mg  and add hctz 12.5  outsice BPs 3) Refer for Mammogram 4) Flu shot administered 5) Anemia-resolved 6) Labs: CBC, CMP, Lipid panel 7/ obesity- needs wt loss  Schedule for cpe this next 3- 6 mos

## 2012-05-06 ENCOUNTER — Other Ambulatory Visit: Payer: Self-pay | Admitting: Radiology

## 2012-05-06 DIAGNOSIS — Z1231 Encounter for screening mammogram for malignant neoplasm of breast: Secondary | ICD-10-CM

## 2012-05-06 DIAGNOSIS — Z1239 Encounter for other screening for malignant neoplasm of breast: Secondary | ICD-10-CM

## 2012-05-06 LAB — COMPREHENSIVE METABOLIC PANEL
Albumin: 4.2 g/dL (ref 3.5–5.2)
Alkaline Phosphatase: 54 U/L (ref 39–117)
BUN: 10 mg/dL (ref 6–23)
CO2: 27 mEq/L (ref 19–32)
Glucose, Bld: 78 mg/dL (ref 70–99)
Total Bilirubin: 0.4 mg/dL (ref 0.3–1.2)

## 2012-05-06 LAB — LIPID PANEL
Cholesterol: 212 mg/dL — ABNORMAL HIGH (ref 0–200)
Total CHOL/HDL Ratio: 2.6 Ratio
VLDL: 12 mg/dL (ref 0–40)

## 2012-05-07 ENCOUNTER — Encounter: Payer: Self-pay | Admitting: Internal Medicine

## 2012-05-21 ENCOUNTER — Ambulatory Visit (INDEPENDENT_AMBULATORY_CARE_PROVIDER_SITE_OTHER): Payer: 59 | Admitting: Cardiovascular Disease

## 2012-05-21 ENCOUNTER — Telehealth: Payer: Self-pay | Admitting: Radiology

## 2012-05-21 ENCOUNTER — Ambulatory Visit
Admission: RE | Admit: 2012-05-21 | Discharge: 2012-05-21 | Disposition: A | Discharge status: Home / Self Care | Payer: 59 | Source: Ambulatory Visit | Attending: Family Medicine | Admitting: Family Medicine

## 2012-05-21 ENCOUNTER — Telehealth: Payer: Self-pay | Admitting: *Deleted

## 2012-05-21 ENCOUNTER — Ambulatory Visit (INDEPENDENT_AMBULATORY_CARE_PROVIDER_SITE_OTHER): Payer: 59 | Admitting: Family Medicine

## 2012-05-21 ENCOUNTER — Encounter: Payer: Self-pay | Admitting: Cardiovascular Disease

## 2012-05-21 ENCOUNTER — Ambulatory Visit: Payer: 59

## 2012-05-21 VITALS — BP 154/98 | HR 62 | Temp 98.0°F | Resp 16 | Ht 65.0 in | Wt 216.0 lb

## 2012-05-21 VITALS — BP 158/96 | HR 66 | Ht 64.0 in | Wt 216.0 lb

## 2012-05-21 DIAGNOSIS — M25519 Pain in unspecified shoulder: Secondary | ICD-10-CM

## 2012-05-21 DIAGNOSIS — M25511 Pain in right shoulder: Secondary | ICD-10-CM

## 2012-05-21 DIAGNOSIS — R1011 Right upper quadrant pain: Secondary | ICD-10-CM

## 2012-05-21 DIAGNOSIS — R079 Chest pain, unspecified: Secondary | ICD-10-CM | POA: Insufficient documentation

## 2012-05-21 DIAGNOSIS — R9431 Abnormal electrocardiogram [ECG] [EKG]: Secondary | ICD-10-CM

## 2012-05-21 DIAGNOSIS — I10 Essential (primary) hypertension: Secondary | ICD-10-CM

## 2012-05-21 LAB — POCT CBC
HCT, POC: 42 % (ref 37.7–47.9)
Hemoglobin: 12.9 g/dL (ref 12.2–16.2)
Lymph, poc: 2.2 (ref 0.6–3.4)
MCH, POC: 29.9 pg (ref 27–31.2)
MCHC: 30.7 g/dL — AB (ref 31.8–35.4)
MCV: 97.4 fL — AB (ref 80–97)
POC Granulocyte: 1.8 — AB (ref 2–6.9)
POC LYMPH PERCENT: 50.2 %L — AB (ref 10–50)
RDW, POC: 12.7 %
WBC: 4.3 10*3/uL — AB (ref 4.6–10.2)

## 2012-05-21 MED ORDER — LOSARTAN POTASSIUM 100 MG PO TABS: 100.0000 mg | ORAL_TABLET | Freq: Every day | ORAL | Status: DC

## 2012-05-21 NOTE — Addendum Note (Signed)
Addended by: Scherrie Bateman E on: 05/21/2012 05:31 PM   Modules accepted: Orders

## 2012-05-21 NOTE — Telephone Encounter (Signed)
Amy Dr. Durel Salts nurse called to have the DOD read pt's EKQ which was  done in their office. PCP wants to know what to do next. Pt C/O of right chest pain. EKQ reviewed by Dr. [place] MD and advised pt needs to be seen by the DOD. Scherrie Bateman Dr. Fabio Bering nurse aware. DOD will see pt  today at 2:00 PM. PCP's nurse AMY aware, and  will send pt right in the office  to be seen by Dr. Eden Emms DOD.

## 2012-05-21 NOTE — Progress Notes (Signed)
[facility] [address], [place] [address] [phone]  Date:  05/21/2012   Name:  [name] [name]   DOB:  1970/08/13   MRN:  [id number]  PCP:  Janace Hoard, MD    Chief Complaint: Follow-up and Constipation   History of Present Illness:  [name] [name] is a 41 y.o. very pleasant female patient who presents with the following:  Here on 11/3 with complaint of right shoulder pain down hier arm, with some paraesthesias for one week.  She was given mobic and a home exercise program.   She was also noted to have elevated BP at that visit so HCTZ was added to her lopressor.  However, this caused heart palpitations so she stopped using it.   She took the mobic for 3 days, did not notice any difference and stopped taking it.  Her shoulder is now a "little bit better."   She also has complaint of RUQ pain for the last 8 days.   She thinks she may have had this in the past but is not sure.  Eating does not make this worse.   The pain is constant.  She also has some vague chest pain "across my chest" that she notes just occasionally.   She also notes that her stools are small and hard since the onset of this pain.  She has tried fruit but so far as not noted improvement.   She has her menses now.  No nausea or vomiting.   She is eating normally.   No fever or chills.   No urinary symptoms.   She has never had surgery.  Never been told that she had gallbladder problems in the past  Patient Active Problem List  Diagnosis  . HYPERTENSION  . Obesity, morbid (more than 100 lbs over ideal weight or BMI > 40)    Past Medical History  Diagnosis Date  . Other chest pain   . Unspecified essential hypertension     No past surgical history on file.  History  Substance Use Topics  . Smoking status: Never Smoker   . Smokeless tobacco: Never Used  . Alcohol Use: No    Family History  Problem Relation Age of Onset  . Coronary artery disease Mother   . Other  Brother     fluid around heart    No Known Allergies  Medication list has been reviewed and updated.  Current Outpatient Prescriptions on File Prior to Visit  Medication Sig Dispense Refill  . fluticasone (FLONASE) 50 MCG/ACT nasal spray Place 2 sprays into the nose daily.  16 g  6  . hydrochlorothiazide (MICROZIDE) 12.5 MG capsule Take 1 capsule (12.5 mg total) by mouth daily.  90 capsule  1  . meloxicam (MOBIC) 15 MG tablet Take 1 tablet (15 mg total) by mouth daily.  30 tablet  0  . metoprolol tartrate (LOPRESSOR) 25 MG tablet Take 1 tablet (25 mg total) by mouth 2 (two) times daily.  180 tablet  1    Review of Systems:  As per HPI- otherwise negative.   Physical Examination: Filed Vitals:   05/21/12 1124  BP: 154/98  Pulse: 62  Temp: 98 F (36.7 C)  Resp: 16   Filed Vitals:   05/21/12 1124  Height: 5\' 5"  (1.651 m)  Weight: 216 lb (97.977 kg)   Body mass index is 35.94 kg/(m^2). Ideal Body Weight: Weight in (lb) to have BMI = 25: 149.9   GEN: WDWN, NAD, Non-toxic,  A & O x 3, obese HEENT: Atraumatic, Normocephalic. Neck supple. No masses, No LAD. Ears and Nose: No external deformity. CV: RRR, No M/G/R. No JVD. No thrill. No extra heart sounds. PULM: CTA B, no wheezes, crackles, rhonchi. No retractions. No resp. distress. No accessory muscle use. ABD: S, ND, +BS. No rebound. No HSM. She does have minimal RUQ tenderness and positive murphy's sign.  She indicates tenderness over her right lower rib border but I cannot reproduce this with pressure.   Her right shoulder has normal ROM and she does not have signs of impingement  EXTR: No c/c/e NEURO Normal gait.  PSYCH: Normally interactive. Conversant. Not depressed or anxious appearing.  Calm demeanor.   [facility] reading (PRIMARY) by  Dr. Patsy Lager. Right shoulder: normal Cxr: borderline cardiomegaly, mild degenerative changes in her Thoracic spine  CHEST - 2 VIEW  Comparison: 09/04/2005  Findings: The cardiac  silhouette is borderline enlarged. No mediastinal or hilar mass or adenopathy. The lungs are clear. The bony thorax is intact.  IMPRESSION: No acute cardiopulmonary disease.  RIGHT SHOULDER - 2+ VIEW  Comparison: None.  Findings: There is no evidence of fracture or dislocation. There is no evidence of arthropathy or other focal bone abnormality. Soft tissues are unremarkable.  IMPRESSION: Negative.  EKG: she has more frequent PVCs.  Sent to [facility] cards for review- they asked for the patient to be seen there today.    Results for orders placed in visit on 05/21/12  POCT CBC      Component Value Range   WBC 4.3 (*) 4.6 - 10.2 K/uL   Lymph, poc 2.2  0.6 - 3.4   POC LYMPH PERCENT 50.2 (*) 10 - 50 %L   MID (cbc) 0.3  0 - 0.9   POC MID % 8.0  0 - 12 %M   POC Granulocyte 1.8 (*) 2 - 6.9   Granulocyte percent 41.8  37 - 80 %G   RBC 4.31  4.04 - 5.48 M/uL   Hemoglobin 12.9  12.2 - 16.2 g/dL   HCT, POC 16.1  09.6 - 47.9 %   MCV 97.4 (*) 80 - 97 fL   MCH, POC 29.9  27 - 31.2 pg   MCHC 30.7 (*) 31.8 - 35.4 g/dL   RDW, POC 04.5     Platelet Count, POC 199  142 - 424 K/uL   MPV 12.9  0 - 99.8 fL    Assessment and Plan: 1. RUQ pain  POCT CBC, EKG 12-Lead, US Abdomen Complete, Hepatic Function Panel  2. Right shoulder pain  DG Chest 2 View, DG Shoulder Right   EKG changes and atypical CP- sent to [facility] today for consultation.  See their note.  Stopped HCTZ due to SE. Cardiology changed her medication to losartan.    RUQ pain is concerning for gallbladder disease.  Sent for ultrasound today:  COMPLETE ABDOMINAL ULTRASOUND  Comparison: None  Findings:  Gallbladder: No gallstones, gallbladder wall thickening, or pericholecystic fluid.  Common bile duct: Normal in caliber measuring a maximum of 2.48mm.  Liver: The liver is sonographically unremarkable. There is normal echogenicity without focal lesions or intrahepatic biliary dilatation.  IVC: Normal  caliber.  Pancreas: Incomplete visualization but the body and head regions that are visualized appear normal.  Spleen: Normal size and echogenicity without focal lesions.  Right Kidney: 11.8 cm in length. Normal renal cortical thickness and echogenicity without focal lesions or hydronephrosis.  Left Kidney: 11.6 cm in length. It has a normal kidney  Abdominal aorta: Normal caliber.  IMPRESSION:  1. Normal sonographic appearance of the gallbladder and normal caliber common bile duct. 2. Incomplete visualization of the pancreas. Otherwise, normal examination.   Ethelda Deangelo, MD Called and let her know that her ultrasound is normal.  Suspect her symptoms may be due to constipation.  Recommend that she try some miralax.  She will let me know if she is not better, and I will contact her tomorrow with her LFTs.  If she gets worse or has any other problems in the meantime she may give me a call.    Cardiology evaluated her today as well and they plan a stress test in a couple of weeks

## 2012-05-21 NOTE — Assessment & Plan Note (Signed)
Atypical but PVC;s and inferior T wave changes.  F/U stress myovue

## 2012-05-21 NOTE — Patient Instructions (Addendum)
Your physician recommends that you schedule a follow-up appointment in: 2-3 WEEKS WITH DR WALL  Your physician has recommended you make the following change in your medication: STOP  LOPRESSOR  START LOSARTAN 100 MG EVERY DAY  Your physician has requested that you have en exercise stress myoview. For further information please visit [email or web address] Please follow instruction sheet, as given. DX CHEST PAIN

## 2012-05-21 NOTE — Progress Notes (Signed)
Patient ID: [name] [name], female   DOB: 22-Oct-1970, 41 y.o.   MRN: [id number] 41 yo patient of Dr Daleen Squibb. Added on to DOD schedule at request of Dr Dallas Schimke.   Previously seen for palpitatoins, atypical chest pain and followed by Dr Alwyn Ren for HTN.  Seen by Dr Dallas Schimke today for right shoulder pain.  ECG with PVC;s ( PvC;s noted before and on ECG 11/12)  Previous echo 2011  normal with EF 55% and grade one diastolic dysfucntion.  ECG also with biphasic T waves in leads 2,3,F mild change from 2012.  Pain is very atypical  Right arm and shoulder as well as RUQ.  Not related to food or exertion.  She does not think metoprolol is doing a good job with her BP    ROS: Denies fever, malais, weight loss, blurry vision, decreased visual acuity, cough, sputum, SOB, hemoptysis, pleuritic pain, palpitaitons, heartburn, abdominal pain, melena, lower extremity edema, claudication, or rash.  All other systems reviewed and negative  General: Affect appropriate Overweight black female HEENT: normal Neck supple with no adenopathy JVP normal no bruits no thyromegaly Lungs clear with no wheezing and good diaphragmatic motion Heart:  S1/S2 no murmur, no rub, gallop or click PMI normal Abdomen: benighn, BS positve, no tenderness, no AAA no bruit.  No HSM or HJR Distal pulses intact with no bruits No edema Neuro non-focal Skin warm and dry No muscular weakness   Current Outpatient Prescriptions  Medication Sig Dispense Refill  . fluticasone (FLONASE) 50 MCG/ACT nasal spray Place 2 sprays into the nose daily.  16 g  6  . hydrochlorothiazide (MICROZIDE) 12.5 MG capsule Take 1 capsule (12.5 mg total) by mouth daily.  90 capsule  1  . meloxicam (MOBIC) 15 MG tablet Take 1 tablet (15 mg total) by mouth daily.  30 tablet  0  . metoprolol tartrate (LOPRESSOR) 25 MG tablet Take 1 tablet (25 mg total) by mouth 2 (two) times daily.  180 tablet  1    Allergies  Review of patient's allergies indicates no known  allergies.  Electrocardiogram:  Assessment and Plan

## 2012-05-21 NOTE — Telephone Encounter (Signed)
Patient is in our office now with Dr Patsy Lager. I am trying to call your office phone rings with no answer, can you review the EKG for patient and advise what we are to do? EKG done here but you can view in her chart under ECG tab, please advise call or send this message back.

## 2012-05-21 NOTE — Assessment & Plan Note (Signed)
Change beta blocker to cozaar Discussed weight loss

## 2012-05-21 NOTE — Patient Instructions (Signed)
You have an appointment to have an abdominal ultrasound at 3:30 pm

## 2012-05-21 NOTE — Telephone Encounter (Signed)
[facility] called back, they want patient to come there today, I called patient left message for her. She had just left office when cardiology called me back.

## 2012-05-22 ENCOUNTER — Telehealth: Payer: Self-pay

## 2012-05-22 LAB — HEPATIC FUNCTION PANEL
ALT: 15 U/L (ref 0–35)
AST: 18 U/L (ref 0–37)
Albumin: 4.1 g/dL (ref 3.5–5.2)
Alkaline Phosphatase: 53 U/L (ref 39–117)
Total Bilirubin: 0.4 mg/dL (ref 0.3–1.2)
Total Protein: 7.2 g/dL (ref 6.0–8.3)

## 2012-05-22 NOTE — Telephone Encounter (Signed)
Patient advised of the need for appt, her husband stated he would take her.

## 2012-05-22 NOTE — Telephone Encounter (Signed)
Pt is returning dr copland call    Best number [phone]

## 2012-05-23 ENCOUNTER — Other Ambulatory Visit: Payer: Self-pay | Admitting: Cardiovascular Disease

## 2012-05-23 MED ORDER — LOSARTAN POTASSIUM 100 MG PO TABS: 100.0000 mg | ORAL_TABLET | Freq: Every day | ORAL | Status: DC

## 2012-05-23 MED ORDER — MELOXICAM 15 MG PO TABS: 15.0000 mg | ORAL_TABLET | Freq: Every day | ORAL | Status: DC

## 2012-05-23 NOTE — Telephone Encounter (Signed)
I called and spoke to her husband. She has seen the cardiologist, some of her meds were changed, but she is not sure what was changed, states nothing was sent to the pharmacy.

## 2012-05-23 NOTE — Telephone Encounter (Signed)
Her med list is correct and explained in my office note.  Her beta blocker was stopped and she was started on cozaar 100mg 

## 2012-05-23 NOTE — Telephone Encounter (Signed)
Thanks. I called patient to clarify. I called pharmacy also, to verify they did get the rx ready for her. I spoke to patients husband if he has any further questions he will call me back.

## 2012-05-23 NOTE — Telephone Encounter (Signed)
Make sure her cozaar was called in

## 2012-05-26 ENCOUNTER — Encounter (HOSPITAL_COMMUNITY): Payer: Self-pay | Admitting: Emergency Medicine

## 2012-05-26 ENCOUNTER — Emergency Department (HOSPITAL_COMMUNITY)
Admission: EM | Admit: 2012-05-26 | Discharge: 2012-05-27 | Disposition: A | Discharge status: Home / Self Care | Payer: 59 | Attending: Emergency Medicine | Admitting: Emergency Medicine

## 2012-05-26 DIAGNOSIS — Z79899 Other long term (current) drug therapy: Secondary | ICD-10-CM | POA: Insufficient documentation

## 2012-05-26 DIAGNOSIS — I10 Essential (primary) hypertension: Secondary | ICD-10-CM | POA: Insufficient documentation

## 2012-05-26 DIAGNOSIS — R1011 Right upper quadrant pain: Secondary | ICD-10-CM | POA: Insufficient documentation

## 2012-05-26 LAB — URINALYSIS, MICROSCOPIC ONLY
Glucose, UA: NEGATIVE mg/dL
Hgb urine dipstick: NEGATIVE
pH: 8 (ref 5.0–8.0)

## 2012-05-26 LAB — CBC WITH DIFFERENTIAL/PLATELET
Basophils Absolute: 0 10*3/uL (ref 0.0–0.1)
Basophils Relative: 0 % (ref 0–1)
HCT: 33 % — ABNORMAL LOW (ref 36.0–46.0)
Lymphocytes Relative: 48 % — ABNORMAL HIGH (ref 12–46)
MCHC: 33.9 g/dL (ref 30.0–36.0)
Monocytes Absolute: 0.3 10*3/uL (ref 0.1–1.0)
Neutro Abs: 2 10*3/uL (ref 1.7–7.7)
Platelets: 160 10*3/uL (ref 150–400)
RDW: 11.7 % (ref 11.5–15.5)
WBC: 4.5 10*3/uL (ref 4.0–10.5)

## 2012-05-26 MED ORDER — POTASSIUM CHLORIDE CRYS ER 20 MEQ PO TBCR
10.0000 meq | EXTENDED_RELEASE_TABLET | Freq: Once | ORAL | Status: AC
Start: 2012-05-27 — End: 2012-05-27
  Administered 2012-05-27: 10 meq via ORAL
  Filled 2012-05-26: qty 1

## 2012-05-26 MED ORDER — FENTANYL CITRATE 0.05 MG/ML IJ SOLN
50.0000 ug | Freq: Once | INTRAMUSCULAR | Status: AC
  Administered 2012-05-27: 50 ug via INTRAVENOUS
  Filled 2012-05-26: qty 2

## 2012-05-26 MED ORDER — ONDANSETRON HCL 4 MG/2ML IJ SOLN
4.0000 mg | Freq: Once | INTRAMUSCULAR | Status: AC
  Administered 2012-05-27: 4 mg via INTRAVENOUS
  Filled 2012-05-26: qty 2

## 2012-05-26 MED ORDER — SODIUM CHLORIDE 0.9 % IV BOLUS (SEPSIS)
1000.0000 mL | Freq: Once | INTRAVENOUS | Status: AC
  Administered 2012-05-27: 1000 mL via INTRAVENOUS

## 2012-05-26 MED ORDER — SODIUM CHLORIDE 0.9 % IV SOLN: INTRAVENOUS | Status: DC

## 2012-05-26 NOTE — ED Notes (Addendum)
For 2 weeks having RUQ radiating to back- reports had ultrasound done with negative results; denies vomiting; pt having rebound tenderness

## 2012-05-26 NOTE — ED Notes (Signed)
Pt states that "when I eat it feels like it stops." Pt also states that the pain is worse after eating.

## 2012-05-26 NOTE — ED Provider Notes (Signed)
History     CSN: [id number]  Arrival date & time 05/26/12  2137   First MD Initiated Contact with Patient 05/26/12 2253      Chief Complaint  Patient presents with  . Abdominal Pain    (Consider location/radiation/quality/duration/timing/severity/associated sxs/prior treatment) HPI History provided by patient and her husband bedside. Has been having right upper quadrant abdominal pain intermittently over the last few weeks. Seems to be worse after eating. Followed by [facility] Dr. Alwyn Ren. She had an outpatient ultrasound performed that was reported normal. She is also scheduled for an outpatient stress test in 2 days. She denies any chest pain or shortness of breath. No fevers although did feel warm today. No chills. Some nausea no vomiting. Pain radiates to her back. Pain is moderate in severity. It is intermittent and recurrent. No known alleviating factors. No trauma. No history of abdominal surgeries. No change in pain. Past Medical History  Diagnosis Date  . Other chest pain   . Unspecified essential hypertension     History reviewed. No pertinent past surgical history.  Family History  Problem Relation Age of Onset  . Coronary artery disease Mother   . Other Brother     fluid around heart    History  Substance Use Topics  . Smoking status: Never Smoker   . Smokeless tobacco: Never Used  . Alcohol Use: No    OB History    Grav Para Term Preterm Abortions TAB SAB Ect Mult Living                  Review of Systems  Constitutional: Negative for fever and chills.  HENT: Negative for neck pain and neck stiffness.   Eyes: Negative for pain.  Respiratory: Negative for shortness of breath.   Cardiovascular: Negative for chest pain.  Gastrointestinal: Positive for abdominal pain. Negative for vomiting.  Genitourinary: Negative for dysuria.  Musculoskeletal: Negative for back pain.  Skin: Negative for rash.  Neurological: Negative for headaches.  All other  systems reviewed and are negative.    Allergies  Hctz  Home Medications   Current Outpatient Rx  Name  Route  Sig  Dispense  Refill  . LOSARTAN POTASSIUM 100 MG PO TABS   Oral   Take 100 mg by mouth daily. For chest pain         . METOPROLOL TARTRATE 25 MG PO TABS   Oral   Take 25 mg by mouth 2 (two) times daily.         Marland Kitchen NAPROXEN SODIUM 220 MG PO TABS   Oral   Take 440 mg by mouth daily as needed. For pain           BP 167/107  Pulse 75  Temp 98.1 F (36.7 C) (Oral)  Resp 19  SpO2 100%  LMP 05/21/2012  Physical Exam  Constitutional: She is oriented to person, place, and time. She appears well-developed and well-nourished.  HENT:  Head: Normocephalic and atraumatic.  Eyes: Conjunctivae normal and EOM are normal. Pupils are equal, round, and reactive to light.  Neck: Trachea normal. Neck supple. No thyromegaly present.  Cardiovascular: Normal rate, regular rhythm, S1 normal, S2 normal and normal pulses.     No systolic murmur is present   No diastolic murmur is present  Pulses:      Radial pulses are 2+ on the right side, and 2+ on the left side.  Pulmonary/Chest: Effort normal and breath sounds normal. She has no wheezes. She  has no rhonchi. She has no rales. She exhibits no tenderness.  Abdominal: Soft. Normal appearance and bowel sounds are normal. There is no CVA tenderness.       Is soft throughout without rebound or guarding. No acute abdomen. There is tenderness in the right upper quadrant with negative Murphy's sign. No tenderness otherwise  Musculoskeletal: Normal range of motion. She exhibits no edema and no tenderness.  Neurological: She is alert and oriented to person, place, and time. She has normal strength. No cranial nerve deficit or sensory deficit. GCS eye subscore is 4. GCS verbal subscore is 5. GCS motor subscore is 6.  Skin: Skin is warm and dry. No rash noted. She is not diaphoretic.  Psychiatric: Her speech is normal.       Cooperative  and appropriate    ED Course  Procedures (including critical care time)  Results for orders placed during the hospital encounter of 05/26/12  CBC WITH DIFFERENTIAL      Component Value Range   WBC 4.5  4.0 - 10.5 K/uL   RBC 3.59 (*) 3.87 - 5.11 MIL/uL   Hemoglobin 11.2 (*) 12.0 - 15.0 g/dL   HCT 16.1 (*) 09.6 - 04.5 %   MCV 91.9  78.0 - 100.0 fL   MCH 31.2  26.0 - 34.0 pg   MCHC 33.9  30.0 - 36.0 g/dL   RDW 40.9  81.1 - 91.4 %   Platelets 160  150 - 400 K/uL   Neutrophils Relative 44  43 - 77 %   Neutro Abs 2.0  1.7 - 7.7 K/uL   Lymphocytes Relative 48 (*) 12 - 46 %   Lymphs Abs 2.1  0.7 - 4.0 K/uL   Monocytes Relative 7  3 - 12 %   Monocytes Absolute 0.3  0.1 - 1.0 K/uL   Eosinophils Relative 2  0 - 5 %   Eosinophils Absolute 0.1  0.0 - 0.7 K/uL   Basophils Relative 0  0 - 1 %   Basophils Absolute 0.0  0.0 - 0.1 K/uL  COMPREHENSIVE METABOLIC PANEL      Component Value Range   Sodium 138  135 - 145 mEq/L   Potassium 3.4 (*) 3.5 - 5.1 mEq/L   Chloride 105  96 - 112 mEq/L   CO2 27  19 - 32 mEq/L   Glucose, Bld PENDING  70 - 99 mg/dL   BUN 9  6 - 23 mg/dL   Creatinine, Ser PENDING  0.50 - 1.10 mg/dL   Calcium 9.1  8.4 - 78.2 mg/dL   Total Protein 6.6  6.0 - 8.3 g/dL   Albumin PENDING  3.5 - 5.2 g/dL   AST 16  0 - 37 U/L   ALT PENDING  0 - 35 U/L   Alkaline Phosphatase PENDING  39 - 117 U/L   Total Bilirubin 0.2 (*) 0.3 - 1.2 mg/dL   GFR calc non Af Amer PENDING  >90 mL/min   GFR calc Af Amer PENDING  >90 mL/min  LIPASE, BLOOD      Component Value Range   Lipase 29  11 - 59 U/L  URINALYSIS, MICROSCOPIC ONLY      Component Value Range   Color, Urine YELLOW  YELLOW   APPearance CLOUDY (*) CLEAR   Specific Gravity, Urine 1.025  1.005 - 1.030   pH 8.0  5.0 - 8.0   Glucose, UA NEGATIVE  NEGATIVE mg/dL   Hgb urine dipstick NEGATIVE  NEGATIVE   Bilirubin Urine  NEGATIVE  NEGATIVE   Ketones, ur 15 (*) NEGATIVE mg/dL   Protein, ur NEGATIVE  NEGATIVE mg/dL   Urobilinogen,  UA 1.0  0.0 - 1.0 mg/dL   Nitrite NEGATIVE  NEGATIVE   Leukocytes, UA SMALL (*) NEGATIVE   WBC, UA 0-2  <3 WBC/hpf   RBC / HPF 0-2  <3 RBC/hpf   Bacteria, UA FEW (*) RARE   Squamous Epithelial / LPF MANY (*) RARE   Urine-Other MUCOUS PRESENT    POCT PREGNANCY, URINE      Component Value Range   Preg Test, Ur NEGATIVE  NEGATIVE   Dg Chest 2 View  05/21/2012  *RADIOLOGY REPORT*  Clinical Data: Palpitations and upper chest pain  CHEST - 2 VIEW  Comparison: 09/04/2005  Findings: The cardiac silhouette is borderline enlarged.  No mediastinal or hilar mass or adenopathy.  The lungs are clear.  The bony thorax is intact.  IMPRESSION: No acute cardiopulmonary disease.   Original Report Authenticated By: Amie Portland, M.D.    Dg Shoulder Right  05/21/2012  *RADIOLOGY REPORT*  Clinical Data: Pain  RIGHT SHOULDER - 2+ VIEW  Comparison:  None.  Findings:  There is no evidence of fracture or dislocation.  There is no evidence of arthropathy or other focal bone abnormality. Soft tissues are unremarkable.  IMPRESSION: Negative.   Original Report Authenticated By: Davonna Belling, M.D.    US Abdomen Complete  05/21/2012  *RADIOLOGY REPORT*  Clinical Data:  Right upper quadrant abdominal pain.  COMPLETE ABDOMINAL ULTRASOUND  Comparison:  None  Findings:  Gallbladder:  No gallstones, gallbladder wall thickening, or pericholecystic fluid.  Common bile duct:  Normal in caliber measuring a maximum of 2.46mm.  Liver:  The liver is sonographically unremarkable.  There is normal echogenicity without focal lesions or intrahepatic biliary dilatation.  IVC:  Normal caliber.  Pancreas:  Incomplete visualization but the body and head regions that are visualized appear normal.  Spleen:  Normal size and echogenicity without focal lesions.  Right Kidney:  11.8 cm in length. Normal renal cortical thickness and echogenicity without focal lesions or hydronephrosis.  Left Kidney:  11.6 cm in length.  It has a normal kidney  Abdominal  aorta:  Normal caliber.  IMPRESSION:  1.  Normal sonographic appearance of the gallbladder and normal caliber common bile duct. 2.  Incomplete visualization of the pancreas.  Otherwise, normal examination.   Original Report Authenticated By: Rudie Meyer, M.D.     Old records reviewed. Patient had ultrasound gallbladder 05/21/2012 results normal gallbladder no stones or evidence of cholecystitis.  IV fluids NPO IV narcotics pain control  Repeat exam remains no acute abdomen. Pain resolved. No emesis.  Tolerates POs  11:49 PM primary care consult obtained. Case discussed with family practice resident on call. He agrees to relay to Dr. Alwyn Ren in the morning that patient should get an outpatient HIDA scan - Dr Ermalinda Memos to notify Dr Alwyn Ren.   Patient and her husband both agreeable to plan to state understanding all discharge and followup instructions. Rx Phenergan and pain medications provided. Stable for discharge home, call the morning to schedule outpatient studies MDM   Right upper quadrant abdominal pain with symptoms that suggest gallbladder etiology. Labs obtained and reviewed as above. No leukocytosis. Normal lipase. Normal LFTs. Records reviewed and normal ultrasound 4 days ago. Improved with IV narcotics. No indication for repeat imaging at this time based on presentation exam. Nursing notes reviewed - does not have an acute abdomen. Vital signs reviewed.  Plan outpatient HIDA. Pt to d/w PCP          Sunnie Nielsen, MD 05/27/12 [phone]

## 2012-05-27 LAB — COMPREHENSIVE METABOLIC PANEL
ALT: 13 U/L (ref 0–35)
AST: 16 U/L (ref 0–37)
Albumin: 3.4 g/dL — ABNORMAL LOW (ref 3.5–5.2)
CO2: 27 mEq/L (ref 19–32)
Calcium: 9.1 mg/dL (ref 8.4–10.5)
Chloride: 105 mEq/L (ref 96–112)
Creatinine, Ser: 0.85 mg/dL (ref 0.50–1.10)
Sodium: 138 mEq/L (ref 135–145)
Total Bilirubin: 0.2 mg/dL — ABNORMAL LOW (ref 0.3–1.2)

## 2012-05-27 MED ORDER — PROMETHAZINE HCL 25 MG PO TABS: 25.0000 mg | ORAL_TABLET | Freq: Four times a day (QID) | ORAL | Status: DC | PRN

## 2012-05-27 MED ORDER — HYDROCODONE-ACETAMINOPHEN 5-325 MG PO TABS: 1.0000 | ORAL_TABLET | ORAL | Status: DC | PRN

## 2012-05-27 NOTE — Telephone Encounter (Signed)
NOTED ./CY 

## 2012-05-28 ENCOUNTER — Ambulatory Visit (HOSPITAL_COMMUNITY): Payer: 59 | Attending: Cardiovascular Disease | Admitting: Radiology

## 2012-05-28 ENCOUNTER — Encounter (HOSPITAL_COMMUNITY): Payer: 59

## 2012-05-28 VITALS — BP 131/88 | Ht 64.0 in | Wt 216.0 lb

## 2012-05-28 DIAGNOSIS — R079 Chest pain, unspecified: Secondary | ICD-10-CM

## 2012-05-28 DIAGNOSIS — R9431 Abnormal electrocardiogram [ECG] [EKG]: Secondary | ICD-10-CM

## 2012-05-28 MED ORDER — TECHNETIUM TC 99M SESTAMIBI GENERIC - CARDIOLITE
9.0000 | Freq: Once | INTRAVENOUS | Status: AC | PRN
  Administered 2012-05-28: 9 via INTRAVENOUS

## 2012-05-28 MED ORDER — TECHNETIUM TC 99M SESTAMIBI GENERIC - CARDIOLITE
29.8000 | Freq: Once | INTRAVENOUS | Status: AC | PRN
  Administered 2012-05-28: 29.8 via INTRAVENOUS

## 2012-05-28 NOTE — Progress Notes (Signed)
[facility] SITE 3 NUCLEAR MED [id number] [place] [address] [phone]  Cardiology Nuclear Med Study  [name] [name] is a 41 y.o. female     MRN : [id number]     DOB: 01-12-1971  Procedure Date: 05/28/2012  Nuclear Med Background Indication for Stress Test:  Evaluation for Ischemia, Abnormal EKG and PVC's/Biphasic T waves leads 2,3, f mild  changes from 2012, 05/26/12 ER with abdominal pain: gallbladder  History:  2011 ECHO: EF: 55% Cardiac Risk Factors: Family History - CAD and Hypertension  Symptoms:  Chest Pain, DOE, Fatigue, Palpitations and SOB   Nuclear Pre-Procedure Caffeine/Decaff Intake:  None NPO After: 8:30am   Lungs:  clear O2 Sat: 96% on room air. IV 0.9% NS with Angio Cath:  22g  IV Site: R Hand  IV Started by:  Cathlyn Parsons, RN  Chest Size (in):  40 Cup Size: D  Height: 5\' 4"  (1.626 m)  Weight:  216 lb (97.977 kg)  BMI:  Body mass index is 37.08 kg/(m^2). Tech Comments:  No Rx this am    Nuclear Med Study 1 or 2 day study: 1 day  Stress Test Type:  Stress  Reading MD: Marca Ancona, MD  Order Authorizing Provider:  T.Wall MD  Resting Radionuclide: Technetium 21m Sestamibi  Resting Radionuclide Dose: 9.0 mCi   Stress Radionuclide:  Technetium 43m Sestamibi  Stress Radionuclide Dose: 29.8 mCi           Stress Protocol Rest HR: 52 Stress HR: 160  Rest BP: 131/88 Stress BP: 209/119  Exercise Time (min): 4:15 METS: 6.10   Predicted Max HR: 178 bpm % Max HR: 89.89 bpm Rate Pressure Product: 88416   Dose of Adenosine (mg):  n/a Dose of Lexiscan: n/a mg  Dose of Atropine (mg): n/a Dose of Dobutamine: n/a mcg/kg/min (at max HR)  Stress Test Technologist: Milana Na, EMT-P  Nuclear Technologist:  Domenic Polite, CNMT     Rest Procedure:  Myocardial perfusion imaging was performed at rest 45 minutes following the intravenous administration of Technetium 3m Sestamibi. Rest ECG: Sinus  Bradycardia  Stress Procedure:  The patient performed treadmill exercise using a Bruce  Protocol for 4:15 minutes. The patient stopped due to sob, fatigue, and denied any chest pain.  There were no significant ST-T wave changes, freq pvcs,trigeminy, bigeminy, and sob.  Technetium 35m Sestamibi was injected at peak exercise and myocardial perfusion imaging was performed after a brief delay. Stress ECG: No significant change from baseline ECG  QPS Raw Data Images:  Normal; no motion artifact; normal heart/lung ratio. Stress Images:  Small, mild basal inferior perfusion defect.  Rest Images:  Small, mild basal inferior perfusion defect.  Subtraction (SDS):  Primarily fixed small, mild basal inferior perfusion defect.  Transient Ischemic Dilatation (Normal <1.22):  1.02 Lung/Heart Ratio (Normal <0.45):  0.29  Quantitative Gated Spect Images QGS EDV:  140 ml QGS ESV:  72 ml  Impression Exercise Capacity:  Poor exercise capacity. BP Response:  Hypertensive blood pressure response. Clinical Symptoms:  Dyspnea, fatigue.  ECG Impression:  Frequent PVCs during both exercise and recovery.  Comparison with Prior Nuclear Study: No images to compare  Overall Impression:  Low risk stress nuclear study.  Small, mild fixed basal inferior perfusion defect most likely represents soft tissue attenuation.  Inferolateral wall motion abnormality does not correlate with inferior perfusion defect.  EF mildly depressed on gated images, would confirm EF with echo.   LV Ejection Fraction: 49%.  LV  Wall Motion:  Mild inferolateral hypokinesis.   Marca Ancona 05/28/2012

## 2012-05-29 ENCOUNTER — Telehealth: Payer: Self-pay

## 2012-05-29 DIAGNOSIS — R1011 Right upper quadrant pain: Secondary | ICD-10-CM

## 2012-05-29 NOTE — Telephone Encounter (Signed)
Dr Patsy Lager please advise what we should tell pt.

## 2012-05-29 NOTE — Telephone Encounter (Signed)
Called husband to advise.

## 2012-05-29 NOTE — Telephone Encounter (Signed)
Referral to GI made for evaluation and treatment, possibly a HIDA scan.

## 2012-05-29 NOTE — Telephone Encounter (Signed)
Patient's husband called stating that patient was seen by Dr. Patsy Lager and was ref for ultrasound. According to that everything was ok, but patient was still in pain. Patient ended up going to ED since having the ultrasound and they wanted [place] to refer her to have another ultrasound of gall bladder. Patient's husband request call back at [phone] or [phone].

## 2012-05-31 ENCOUNTER — Telehealth: Payer: Self-pay | Admitting: Cardiology

## 2012-05-31 NOTE — Telephone Encounter (Signed)
Pt c/o intermittent chest pain with nausea and neck pain.  Pt given normal results of nuclear stress test.  However, with given symptoms, pt advised to go to ER.

## 2012-05-31 NOTE — Telephone Encounter (Signed)
Pt having cheat pain right now and she is dizzy and tightness in her throat

## 2012-06-04 ENCOUNTER — Ambulatory Visit: Payer: 59

## 2012-06-11 ENCOUNTER — Other Ambulatory Visit (HOSPITAL_COMMUNITY): Payer: Self-pay | Admitting: Cardiology

## 2012-06-11 ENCOUNTER — Other Ambulatory Visit (HOSPITAL_COMMUNITY): Payer: 59

## 2012-06-11 ENCOUNTER — Ambulatory Visit (HOSPITAL_COMMUNITY): Payer: 59 | Attending: Cardiology | Admitting: Radiology

## 2012-06-11 ENCOUNTER — Other Ambulatory Visit: Payer: Self-pay

## 2012-06-11 DIAGNOSIS — R072 Precordial pain: Secondary | ICD-10-CM | POA: Insufficient documentation

## 2012-06-11 DIAGNOSIS — I10 Essential (primary) hypertension: Secondary | ICD-10-CM | POA: Insufficient documentation

## 2012-06-11 DIAGNOSIS — Z8249 Family history of ischemic heart disease and other diseases of the circulatory system: Secondary | ICD-10-CM | POA: Insufficient documentation

## 2012-06-11 DIAGNOSIS — R5381 Other malaise: Secondary | ICD-10-CM | POA: Insufficient documentation

## 2012-06-11 DIAGNOSIS — R0609 Other forms of dyspnea: Secondary | ICD-10-CM | POA: Insufficient documentation

## 2012-06-11 DIAGNOSIS — R0989 Other specified symptoms and signs involving the circulatory and respiratory systems: Secondary | ICD-10-CM | POA: Insufficient documentation

## 2012-06-11 DIAGNOSIS — R079 Chest pain, unspecified: Secondary | ICD-10-CM

## 2012-06-11 DIAGNOSIS — R002 Palpitations: Secondary | ICD-10-CM | POA: Insufficient documentation

## 2012-06-11 DIAGNOSIS — R9430 Abnormal result of cardiovascular function study, unspecified: Secondary | ICD-10-CM | POA: Insufficient documentation

## 2012-06-11 DIAGNOSIS — L929 Granulomatous disorder of the skin and subcutaneous tissue, unspecified: Secondary | ICD-10-CM

## 2012-06-11 DIAGNOSIS — E669 Obesity, unspecified: Secondary | ICD-10-CM | POA: Insufficient documentation

## 2012-06-11 DIAGNOSIS — R5383 Other fatigue: Secondary | ICD-10-CM | POA: Insufficient documentation

## 2012-06-11 DIAGNOSIS — I059 Rheumatic mitral valve disease, unspecified: Secondary | ICD-10-CM | POA: Insufficient documentation

## 2012-06-11 DIAGNOSIS — I517 Cardiomegaly: Secondary | ICD-10-CM | POA: Insufficient documentation

## 2012-06-11 NOTE — Progress Notes (Signed)
Echocardiogram performed.  

## 2012-06-18 ENCOUNTER — Ambulatory Visit (INDEPENDENT_AMBULATORY_CARE_PROVIDER_SITE_OTHER): Payer: 59 | Admitting: Physician Assistant

## 2012-06-18 ENCOUNTER — Encounter: Payer: Self-pay | Admitting: Physician Assistant

## 2012-06-18 VITALS — BP 162/98 | HR 73 | Ht 64.0 in | Wt 216.2 lb

## 2012-06-18 DIAGNOSIS — R001 Bradycardia, unspecified: Secondary | ICD-10-CM

## 2012-06-18 DIAGNOSIS — R079 Chest pain, unspecified: Secondary | ICD-10-CM

## 2012-06-18 DIAGNOSIS — I498 Other specified cardiac arrhythmias: Secondary | ICD-10-CM

## 2012-06-18 DIAGNOSIS — I10 Essential (primary) hypertension: Secondary | ICD-10-CM

## 2012-06-18 DIAGNOSIS — I4949 Other premature depolarization: Secondary | ICD-10-CM

## 2012-06-18 DIAGNOSIS — I493 Ventricular premature depolarization: Secondary | ICD-10-CM

## 2012-06-18 MED ORDER — FAMOTIDINE 20 MG PO TABS: 20.0000 mg | ORAL_TABLET | Freq: Two times a day (BID) | ORAL | Status: DC

## 2012-06-18 MED ORDER — LOSARTAN POTASSIUM 100 MG PO TABS: 100.0000 mg | ORAL_TABLET | Freq: Every day | ORAL | Status: DC

## 2012-06-18 MED ORDER — METOPROLOL TARTRATE 25 MG PO TABS: ORAL_TABLET | ORAL | Status: DC

## 2012-06-18 NOTE — Progress Notes (Signed)
[address], [address] [place], [place]  [address] Phone: [phone], Fax:  [phone]  Date:  06/18/2012   Name:  [name] [name]   DOB:  08-08-1970   MRN:  [id number]  PCP:  Janace Hoard, MD  Primary Cardiologist:  Dr. Valera Castle  Primary Electrophysiologist:  None    History of Present Illness: [name] [name] is a 41 y.o. female who returns for follow up after recent evaluation for chest pain.  She has a hx of palpitations, chest pain and HTN. She was seen by Dr. Eden Emms 05/21/12. She was set up for a stress Myoview as well as an echocardiogram. Her metoprolol was changed to losartan for blood pressure. ETT-Myoview 05/28/12: Low risk study with a small, mild fixed basal inferior perfusion defect most likely representing soft tissue attenuation with inferolateral HK (wall motion abnormality does not correlate with inferior perfusion defect), EF 49%. Echocardiogram 06/11/12: EF 50-55%, normal wall motion, grade 1 diastolic dysfunction, trivial AI, mild MR, mild LAE, mild RAE. Stress test reviewed by Dr. Eden Emms and he felt she had no evidence of ischemia. Echocardiogram reviewed by Dr. Daleen Squibb who also felt it was normal and no further workup planned. Of note, patient seen in emergency room with abdominal pain 05/26/12 with a normal workup. Recent abdominal ultrasound without gallstones.  According to the notes, outpatient HIDA scan is planned.  Patient has infrequent chest pain. It is left-sided. She cannot really qualify. She notes it with exertion. She notes it at rest. She can exert herself without symptoms. In retrospect, she does note it more after eating spicy foods. She denies dyspnea, syncope, orthopnea, PND or LE edema. Unfortunately, her losartan prescription states to take it for pain. She has not been taking this consistently.  Labs (11/13):    K 3.4, creatinine 0.85, ALT 13, LDL 119, Hgb 11.2  Wt Readings from Last 3 Encounters:  06/18/12 216 lb 3.2 oz (98.068 kg)   05/28/12 216 lb (97.977 kg)  05/21/12 216 lb (97.977 kg)     Past Medical History  Diagnosis Date  . HTN (hypertension)   . Hx of cardiovascular stress test     a. ETT-Myoview 05/28/12: Low risk study with a small, mild fixed basal inferior perfusion defect most likely representing soft tissue attenuation with inferolateral HK (wall motion abnormality does not correlate with inferior perfusion defect), EF 49%.  Marland Kitchen Hx of echocardiogram     a. Echocardiogram 06/11/12: EF 50-55%, normal wall motion, grade 1 diastolic dysfunction, trivial AI, mild MR, mild LAE, mild RAE.    Current Outpatient Prescriptions  Medication Sig Dispense Refill  . HYDROcodone-acetaminophen (NORCO/VICODIN) 5-325 MG per tablet Take 1 tablet by mouth every 4 (four) hours as needed for pain.  15 tablet  0  . losartan (COZAAR) 100 MG tablet Take 100 mg by mouth daily. For chest pain      . metoprolol tartrate (LOPRESSOR) 25 MG tablet Take 25 mg by mouth 2 (two) times daily.      . naproxen sodium (ANAPROX) 220 MG tablet Take 440 mg by mouth daily as needed. For pain      . promethazine (PHENERGAN) 25 MG tablet Take 1 tablet (25 mg total) by mouth every 6 (six) hours as needed for nausea.  30 tablet  0    Allergies: Allergies  Allergen Reactions  . Hctz (Hydrochlorothiazide)     Heart palpitations    Social History:  The patient  reports that she has never smoked. She has never used  smokeless tobacco. She reports that she does not drink alcohol or use illicit drugs.   ROS:  Please see the history of present illness.   No dysphagia, odynophagia, melena, hematochezia.   All other systems reviewed and negative.   PHYSICAL EXAM: VS:  BP 162/98  Pulse 73  Ht 5\' 4"  (1.626 m)  Wt 216 lb 3.2 oz (98.068 kg)  BMI 37.11 kg/m2  LMP 05/21/2012 Well nourished, well developed, in no acute distress HEENT: normal Neck: no JVD Cardiac:  normal S1, S2; irregular rhythm; no murmur Lungs:  clear to auscultation bilaterally,  no wheezing, rhonchi or rales Abd: soft, nontender, no hepatomegaly Ext: no edema Skin: warm and dry Neuro:  CNs 2-12 intact, no focal abnormalities noted  EKG:  NSR, HR 73, frequent PVCs      ASSESSMENT AND PLAN:  1. Chest Pain:   She had a recent Myoview that was low risk. Echocardiogram also demonstrated normal LV function. Her symptoms do not appear to be secondary to ischemia. I suspect that she is having acid reflux causing her symptoms. I will start her on Pepcid 20 mg twice a day. She has been advised to schedule followup with her PCP. She may need referral to gastroenterology. I will defer this decision to her primary care physician.  2. Bradycardia:   Her effective heart rate is somewhere around 50 on her ECG today. She has frequent PVCs. When last seen by Dr. Eden Emms, recommendation was to stop her metoprolol. She never did this. She does note some fatigue.  Question if she is symptomatic from this.  I will taper her off of her beta blocker and discontinue. I will also arrange a followup 24-hour Holter. If she has a large percentage of PVCs or persistent bradycardia, she may need evaluation by electrophysiology. Of note, recent echocardiogram demonstrated normal LV function.  3. Hypertension:   Unfortunately, her losartan prescription states to take it for pain. She has not been taking this consistently. I have recommended that she start taking losartan once daily. Check a basic metabolic panel in one week.  4. Disposition:   Followup with Dr. Daleen Squibb in 3 months.  Signed, Tereso Newcomer, PA-C  11:55 AM 06/18/2012

## 2012-06-18 NOTE — Patient Instructions (Addendum)
PLEASE FOLLOW UP WITH DR. WALL IN ABOUT 3 MONTHS  PLEASE FOLLOW UP WITH YOUR PRIMARY CARE PHYSICIAN ABOUT YOUR CHEST PAIN  Your physician has recommended you make the following change in your medication: START PEPCID 20 MG 1 TABLET TWICE DAILY, RX SENT IN TODAY START LOSARTAN 100 MG DAILY; THIS IS FOR YOUR BLOOD PRESSURE BE SURE TO TAKE THIS EVERYDAY  METOPROLOL TAKE 1/2 TABLET TWICE DAILY FOR 3 DAYS THEN STOP; 2 WEEKS AFTER STOPPING METOPROLOL YOU WILL NEED A 24 HOUR HOLTER MONITOR PLACED   1 WEEK AFTER STARTING THE LOSARTAN YOU WILL NEED LAB WORK TO BE DONE

## 2012-07-02 ENCOUNTER — Other Ambulatory Visit (INDEPENDENT_AMBULATORY_CARE_PROVIDER_SITE_OTHER): Payer: 59

## 2012-07-02 ENCOUNTER — Telehealth: Payer: Self-pay | Admitting: *Deleted

## 2012-07-02 DIAGNOSIS — R001 Bradycardia, unspecified: Secondary | ICD-10-CM

## 2012-07-02 DIAGNOSIS — I10 Essential (primary) hypertension: Secondary | ICD-10-CM

## 2012-07-02 DIAGNOSIS — I498 Other specified cardiac arrhythmias: Secondary | ICD-10-CM

## 2012-07-02 LAB — BASIC METABOLIC PANEL
BUN: 12 mg/dL (ref 6–23)
Calcium: 8.8 mg/dL (ref 8.4–10.5)
Creatinine, Ser: 0.8 mg/dL (ref 0.4–1.2)
GFR: 100.64 mL/min (ref >60.00)
Glucose, Bld: 85 mg/dL (ref 70–99)

## 2012-07-02 NOTE — Telephone Encounter (Signed)
Message copied by Tarri Fuller on Tue Jul 02, 2012  3:18 PM ------      Message from: [place], [place] T      Created: Tue Jul 02, 2012  1:28 PM       Potassium and kidney function look good.      Continue with current treatment plan.      Tereso Newcomer, PA-C  4:49 PM 05/16/2012

## 2012-07-02 NOTE — Telephone Encounter (Signed)
both pt and her husband aware of lab results

## 2012-07-09 ENCOUNTER — Telehealth: Payer: Self-pay | Admitting: *Deleted

## 2012-07-09 ENCOUNTER — Encounter (INDEPENDENT_AMBULATORY_CARE_PROVIDER_SITE_OTHER): Payer: 59

## 2012-07-09 DIAGNOSIS — I493 Ventricular premature depolarization: Secondary | ICD-10-CM

## 2012-07-09 DIAGNOSIS — R002 Palpitations: Secondary | ICD-10-CM

## 2012-07-09 DIAGNOSIS — I498 Other specified cardiac arrhythmias: Secondary | ICD-10-CM

## 2012-07-09 DIAGNOSIS — R001 Bradycardia, unspecified: Secondary | ICD-10-CM

## 2012-07-09 NOTE — Telephone Encounter (Signed)
24 Hr Holter monitor placed on Pt 07/09/12 TK

## 2012-08-02 ENCOUNTER — Telehealth: Payer: Self-pay | Admitting: Physician Assistant

## 2012-08-02 NOTE — Telephone Encounter (Signed)
Patient has NSR and PVCs on monitor. She has a large number of PVCs.   I reviewed this with EP. I would like to try her on Verapamil to suppress them. Start Verapamil 120 mg QD. Looks like she is scheduled to see Dr. Valera Castle in March. She can keep that appt. Tereso Newcomer, PA-C  2:05 PM 08/02/2012

## 2012-08-05 ENCOUNTER — Telehealth: Payer: Self-pay | Admitting: *Deleted

## 2012-08-05 MED ORDER — VERAPAMIL HCL 120 MG PO TABS: 120.0000 mg | ORAL_TABLET | Freq: Every day | ORAL | Status: DC

## 2012-08-05 NOTE — Telephone Encounter (Signed)
s/w pt's husband due to pt language barrier, husband aware of monitor results and to start verapamil 120 mg qd, rx sent in today, husband verablized understanding

## 2012-08-05 NOTE — Telephone Encounter (Signed)
s/w pt's husband due to pt language barrier, husband aware of monitor results and to start verapamil 120 mg qd, rx sent in today, husband verablized understanding 

## 2012-09-09 ENCOUNTER — Ambulatory Visit (INDEPENDENT_AMBULATORY_CARE_PROVIDER_SITE_OTHER): Payer: 59 | Admitting: Family Medicine

## 2012-09-09 VITALS — BP 142/90 | HR 79 | Temp 98.3°F | Resp 18 | Ht 64.75 in | Wt 212.2 lb

## 2012-09-09 MED ORDER — CEFDINIR 300 MG PO CAPS: 300.0000 mg | ORAL_CAPSULE | Freq: Two times a day (BID) | ORAL | Status: DC

## 2012-09-09 NOTE — Progress Notes (Signed)
[facility] [address], [place] [address] [phone]  Date:  09/09/2012   Name:  [name] [name]   DOB:  July 16, 1970   MRN:  [id number]  PCP:  Janace Hoard, MD    Chief Complaint: Cough and Sore Throat   History of Present Illness:  [name] [name] is a 42 y.o. very pleasant female patient who presents with the following:  She is here today with an illness.  She noted onset of cough and a ST 2 weeks ago.  She is coughing up discolored and bad- smelling mucus.  The ST is now gone but the cough continues.  She does have sinus congestion She has noted a HA, but not a fever No vomiting or diarrhea She does have a stuffy nose and an earache that comes and goes. No body aches or chills   She is taking veramyst, cozaar, and verapamil right now.  She will see Dr. Daleen Squibb tomorrow for a recheck- she has been seeing him for CP but her studies so far have been reassuring.    LMP 2/28   Patient Active Problem List  Diagnosis  . Essential hypertension  . Obesity, morbid (more than 100 lbs over ideal weight or BMI > 40)  . Chest pain    Past Medical History  Diagnosis Date  . HTN (hypertension)   . Hx of cardiovascular stress test     a. ETT-Myoview 05/28/12: Low risk study with a small, mild fixed basal inferior perfusion defect most likely representing soft tissue attenuation with inferolateral HK (wall motion abnormality does not correlate with inferior perfusion defect), EF 49%.  Marland Kitchen Hx of echocardiogram     a. Echocardiogram 06/11/12: EF 50-55%, normal wall motion, grade 1 diastolic dysfunction, trivial AI, mild MR, mild LAE, mild RAE.    History reviewed. No pertinent past surgical history.  History  Substance Use Topics  . Smoking status: Never Smoker   . Smokeless tobacco: Never Used  . Alcohol Use: No    Family History  Problem Relation Age of Onset  . Coronary artery disease Mother   . Other Brother     fluid around heart    Allergies   Allergen Reactions  . Hctz (Hydrochlorothiazide)     Heart palpitations    Medication list has been reviewed and updated.  Current Outpatient Prescriptions on File Prior to Visit  Medication Sig Dispense Refill  . losartan (COZAAR) 100 MG tablet Take 1 tablet (100 mg total) by mouth daily.  30 tablet  11  . metoprolol tartrate (LOPRESSOR) 25 MG tablet 1/2 tablet twice daily for 3 days then stop      . verapamil (CALAN) 120 MG tablet Take 1 tablet (120 mg total) by mouth daily.  30 tablet  6  . famotidine (PEPCID) 20 MG tablet Take 1 tablet (20 mg total) by mouth 2 (two) times daily.  60 tablet  2  . HYDROcodone-acetaminophen (NORCO/VICODIN) 5-325 MG per tablet Take 1 tablet by mouth every 4 (four) hours as needed for pain.  15 tablet  0  . naproxen sodium (ANAPROX) 220 MG tablet Take 440 mg by mouth daily as needed. For pain      . promethazine (PHENERGAN) 25 MG tablet Take 1 tablet (25 mg total) by mouth every 6 (six) hours as needed for nausea.  30 tablet  0   No current facility-administered medications on file prior to visit.    Review of Systems: As per HPI- otherwise negative. Allergic  to HCTZ, no other drug allergies known  Physical Examination: Filed Vitals:   09/09/12 1120  BP: 142/90  Pulse: 79  Temp: 98.3 F (36.8 C)  Resp: 18   Filed Vitals:   09/09/12 1120  Height: 5' 4.75" (1.645 m)  Weight: 212 lb 3.2 oz (96.253 kg)   Body mass index is 35.57 kg/(m^2). Ideal Body Weight: Weight in (lb) to have BMI = 25: 148.8  GEN: WDWN, NAD, Non-toxic, A & O x 3, overweight HEENT: Atraumatic, Normocephalic. Neck supple. No masses, No LAD. Bilateral TM wnl, oropharynx normal.  PEERL,EOMI.   Ears and Nose: No external deformity. CV: RRR, No M/G/R. No JVD. No thrill. No extra heart sounds. PULM: CTA B, no wheezes, crackles, rhonchi. No retractions. No resp. distress. No accessory muscle use. EXTR: No c/c/e NEURO Normal gait.  PSYCH: Normally interactive. Conversant. Not  depressed or anxious appearing.  Calm demeanor.   Assessment and Plan: Acute bronchitis - Plan: cefdinir (OMNICEF) 300 MG capsule  Cough  Treat with omnicef for probably bronchitis. Close follow- up if not better, may use OTC medications as tolerated for her cough and other symptoms.    See patient instructions for more details.     Abbe Amsterdam, MD

## 2012-09-09 NOTE — Patient Instructions (Addendum)
Use the antibiotic as directed.  If you are not better in the next few days please let me know- Sooner if worse.    

## 2012-09-10 ENCOUNTER — Ambulatory Visit (INDEPENDENT_AMBULATORY_CARE_PROVIDER_SITE_OTHER): Payer: 59 | Admitting: Cardiology

## 2012-09-10 ENCOUNTER — Encounter: Payer: Self-pay | Admitting: Cardiology

## 2012-09-10 VITALS — BP 144/90 | HR 71 | Ht 64.75 in | Wt 214.0 lb

## 2012-09-10 DIAGNOSIS — R002 Palpitations: Secondary | ICD-10-CM

## 2012-09-10 NOTE — Progress Notes (Signed)
HPI [name] returns today for her palpitations. Echocardiogram was normal except for biatrial enlargement. Monitor showed multiple PVCs. Verapamil 120 mg a day was started and she is remarkably better. Blood pressure still borderline.  Past Medical History  Diagnosis Date  . HTN (hypertension)   . Hx of cardiovascular stress test     a. ETT-Myoview 05/28/12: Low risk study with a small, mild fixed basal inferior perfusion defect most likely representing soft tissue attenuation with inferolateral HK (wall motion abnormality does not correlate with inferior perfusion defect), EF 49%.  Marland Kitchen Hx of echocardiogram     a. Echocardiogram 06/11/12: EF 50-55%, normal wall motion, grade 1 diastolic dysfunction, trivial AI, mild MR, mild LAE, mild RAE.    Current Outpatient Prescriptions  Medication Sig Dispense Refill  . cefdinir (OMNICEF) 300 MG capsule Take 1 capsule (300 mg total) by mouth 2 (two) times daily.  20 capsule  0  . losartan (COZAAR) 100 MG tablet Take 1 tablet (100 mg total) by mouth daily.  30 tablet  11  . verapamil (CALAN) 120 MG tablet Take 1 tablet (120 mg total) by mouth daily.  30 tablet  6   No current facility-administered medications for this visit.    Allergies  Allergen Reactions  . Hctz (Hydrochlorothiazide)     Heart palpitations    Family History  Problem Relation Age of Onset  . Coronary artery disease Mother   . Other Brother     fluid around heart    History   Social History  . Marital Status: Married    Spouse Name: N/A    Number of Children: N/A  . Years of Education: N/A   Occupational History  . Housekeeper     at hotel   Social History Main Topics  . Smoking status: Never Smoker   . Smokeless tobacco: Never Used  . Alcohol Use: No  . Drug Use: No  . Sexually Active: Not on file   Other Topics Concern  . Not on file   Social History Narrative  . No narrative on file    ROS ALL NEGATIVE EXCEPT THOSE NOTED IN HPI  PE  General  Appearance: well developed, well nourished in no acute distress, obese HEENT: symmetrical face, PERRLA, Neck: no JVD, thyromegaly, or adenopathy, trachea midline Chest: symmetric without deformity Cardiac: PMI poorly appreciated, RRR, normal S1, S2, no gallop or murmur Lung: clear to ausculation and percussion Vascular: all pulses full without bruits  Abdominal: nondistended, nontender, good bowel sounds, no HSM, no bruits Extremities: no cyanosis, clubbing or edema, no sign of DVT, no varicosities  Skin: normal color, no rashes Neuro: alert and oriented x 3, non-focal Pysch: normal affect  EKG  BMET    Component Value Date/Time   NA 137 07/02/2012 0852   K 3.7 07/02/2012 0852   CL 107 07/02/2012 0852   CO2 22 07/02/2012 0852   GLUCOSE 85 07/02/2012 0852   BUN 12 07/02/2012 0852   CREATININE 0.8 07/02/2012 0852   CREATININE 0.78 05/05/2012 1618   CALCIUM 8.8 07/02/2012 0852   GFRNONAA 83* 05/26/2012 2227   GFRAA >90 05/26/2012 2227    Lipid Panel     Component Value Date/Time   CHOL 212* 05/05/2012 1618   TRIG 59 05/05/2012 1618   HDL 81 05/05/2012 1618   CHOLHDL 2.6 05/05/2012 1618   VLDL 12 05/05/2012 1618   LDLCALC 119* 05/05/2012 1618    CBC    Component Value Date/Time   WBC 4.5  05/26/2012 2227   WBC 4.3* 05/21/2012 1251   RBC 3.59* 05/26/2012 2227   RBC 4.31 05/21/2012 1251   HGB 11.2* 05/26/2012 2227   HGB 12.9 05/21/2012 1251   HCT 33.0* 05/26/2012 2227   HCT 42.0 05/21/2012 1251   PLT 160 05/26/2012 2227   MCV 91.9 05/26/2012 2227   MCV 97.4* 05/21/2012 1251   MCH 31.2 05/26/2012 2227   MCH 29.9 05/21/2012 1251   MCHC 33.9 05/26/2012 2227   MCHC 30.7* 05/21/2012 1251   RDW 11.7 05/26/2012 2227   LYMPHSABS 2.1 05/26/2012 2227   MONOABS 0.3 05/26/2012 2227   EOSABS 0.1 05/26/2012 2227   BASOSABS 0.0 05/26/2012 2227

## 2012-09-10 NOTE — Assessment & Plan Note (Signed)
Improved on low-dose verapamil. No change in medical treatment. Sodium restriction weight loss encouraged for better blood pressure control. Followup with primary care. Return to cardiology when necessary.

## 2012-09-10 NOTE — Patient Instructions (Addendum)
Continue taking your current medications as prescribed.  Continue to reduce the sodium ( salt) in your diet this along with diet, exercise and taking your medication as prescribed can help lower your blood pressure.  Your physician discussed the importance of regular exercise and recommended that you start or continue a regular exercise program for good health.  30 minutes a day of walking up to a total of 3 hours per week.  Your physician encouraged you to lose weight for better health.  Your physician has requested that you regularly monitor and record your blood pressure readings at home. Please use the same machine at the same time of day to check your readings and record them to bring to your follow-up visit.  Follow-up with your primary care doctor regarding your blood pressure.    Follow-up as needed with cardiology

## 2012-10-26 ENCOUNTER — Ambulatory Visit (INDEPENDENT_AMBULATORY_CARE_PROVIDER_SITE_OTHER): Payer: 59 | Admitting: Family Medicine

## 2012-10-26 VITALS — BP 124/82 | HR 77 | Temp 98.0°F | Resp 17 | Ht 65.0 in | Wt 213.0 lb

## 2012-10-26 DIAGNOSIS — D649 Anemia, unspecified: Secondary | ICD-10-CM

## 2012-10-26 DIAGNOSIS — R1011 Right upper quadrant pain: Secondary | ICD-10-CM

## 2012-10-26 DIAGNOSIS — Z1239 Encounter for other screening for malignant neoplasm of breast: Secondary | ICD-10-CM

## 2012-10-26 DIAGNOSIS — H9203 Otalgia, bilateral: Secondary | ICD-10-CM

## 2012-10-26 DIAGNOSIS — N920 Excessive and frequent menstruation with regular cycle: Secondary | ICD-10-CM

## 2012-10-26 DIAGNOSIS — H9209 Otalgia, unspecified ear: Secondary | ICD-10-CM

## 2012-10-26 LAB — POCT CBC
Granulocyte percent: 33.4 %G — AB (ref 37–80)
HCT, POC: 31.4 % — AB (ref 37.7–47.9)
Hemoglobin: 9.5 g/dL — AB (ref 12.2–16.2)
MCV: 94.6 fL (ref 80–97)
Platelet Count, POC: 113 10*3/uL — AB (ref 142–424)
RBC: 3.32 M/uL — AB (ref 4.04–5.48)

## 2012-10-26 LAB — COMPREHENSIVE METABOLIC PANEL
AST: 18 U/L (ref 0–37)
Albumin: 3.8 g/dL (ref 3.5–5.2)
BUN: 9 mg/dL (ref 6–23)
CO2: 24 mEq/L (ref 19–32)
Calcium: 9.1 mg/dL (ref 8.4–10.5)
Chloride: 108 mEq/L (ref 96–112)
Glucose, Bld: 79 mg/dL (ref 70–99)
Potassium: 4.3 mEq/L (ref 3.5–5.3)

## 2012-10-26 NOTE — Patient Instructions (Signed)
We will refer you to a stomach specialist for your abdominal pain. Avoid fried/fatty foods as these could make your pain worse if this is from your gallbladder.  Return to the clinic or go to the nearest emergency room if any of your symptoms worsen or new symptoms occur.   We will also refer you to a gynecologist for the heavy periods and anemia. If not checked outside of here - return in next 1 week to repeat your blood count - sooner or to emergency room if you become lightheaded or dizzy.  claritin or zyrtec once per day for allergy symptoms.  We will refer you for a mammogram, but schedule/return for a physical with your primary care provider to discuss other health maintenance items and labs.

## 2012-10-26 NOTE — Progress Notes (Signed)
Subjective:    Patient ID: [name] [name], female    DOB: 05-11-1971, 42 y.o.   MRN: [id number]  HPI [name] [name] is a 42 y.o. female  PCP:Dr. Copland.  Multiple concerns.   R sided chest pain - under breast area with radiation to back at times,  Seen prior for similar problem, has had cardiology workup for this and palpitations, started on verapamil for palpitations. Per cardiology notes:  She was seen by Dr. Eden Emms 05/21/12. She was set up for a stress Myoview as well as an echocardiogram. Her metoprolol was changed to losartan for blood pressure. ETT-Myoview 05/28/12: Low risk study with a small, mild fixed basal inferior perfusion defect most likely representing soft tissue attenuation with inferolateral HK (wall motion abnormality does not correlate with inferior perfusion defect), EF 49%. Echocardiogram 06/11/12: EF 50-55%, normal wall motion, grade 1 diastolic dysfunction, trivial AI, mild MR, mild LAE, mild RAE. Stress test reviewed by Dr. Eden Emms and he felt she had no evidence of ischemia. Echocardiogram reviewed by Dr. Daleen Squibb who also felt it was normal and no further workup planned. Seen in emergency room with abdominal pain 05/26/12 with a normal workup. Had abd ultrasound 05/21/12: IMPRESSION: 1. Normal sonographic appearance of the gallbladder and normal caliber common bile duct. 2. Incomplete visualization of the pancreas. Otherwise, normal examination. Per notes plan for Hida scan. Not sure if she had this.   Seen in follow up by Dr. Daleen Squibb in March - no new changes.   Current sx's similar as in past, now present for 1 month, under R breast/rib area.  No n/v. No fever. No cough, not short of breath. Not associated with food.  Radiates to back at times.  Pain last few minutes, then resolves on own. Tx: tylenol.  Notices pain with deep breath at times.    No recent prolonged car travel or air travel, no recent calf pain or swelling.  Ears hurting at times recently - both. Hx of allergic  rhinitis - no meds,   Also wants referral for mmg.   Review of Systems  Constitutional: Negative for fever and appetite change.  Respiratory: Negative for chest tightness and shortness of breath.   Cardiovascular: Negative for chest pain, palpitations and leg swelling.  Gastrointestinal: Positive for abdominal pain. Negative for nausea, vomiting, blood in stool and anal bleeding.  Genitourinary: Positive for menstrual problem (heavy menses. ).  Musculoskeletal: Positive for back pain (radiation of lower chest/abdomen sx. ).  Neurological: Negative for light-headedness (not currently - has had at times in past. ).       Objective:   Physical Exam  Vitals reviewed. Constitutional: She is oriented to person, place, and time. She appears well-developed and well-nourished.  HENT:  Head: Normocephalic and atraumatic.  Right Ear: Tympanic membrane, external ear and ear canal normal.  Left Ear: Tympanic membrane, external ear and ear canal normal.  Eyes: Conjunctivae and EOM are normal. Pupils are equal, round, and reactive to light.  Neck: Carotid bruit is not present.  Cardiovascular: Normal rate, regular rhythm, normal heart sounds and intact distal pulses.   Pulmonary/Chest: Effort normal and breath sounds normal.  Abdominal: Soft. She exhibits no pulsatile midline mass. There is no tenderness.  Musculoskeletal:  Calves nt, no edema.   Neurological: She is alert and oriented to person, place, and time.  Skin: Skin is warm and dry.  Psychiatric: She has a normal mood and affect. Her behavior is normal.   Results for orders placed in visit on  10/26/12  POCT CBC      Result Value Range   WBC 3.7 (*) 4.6 - 10.2 K/uL   Lymph, poc 2.1  0.6 - 3.4   POC LYMPH PERCENT 56.5 (*) 10 - 50 %L   MID (cbc) 0.4  0 - 0.9   POC MID % 10.1  0 - 12 %M   POC Granulocyte 1.2 (*) 2 - 6.9   Granulocyte percent 33.4 (*) 37 - 80 %G   RBC 3.32 (*) 4.04 - 5.48 M/uL   Hemoglobin 9.5 (*) 12.2 - 16.2 g/dL    HCT, POC 16.1 (*) 09.6 - 47.9 %   MCV 94.6  80 - 97 fL   MCH, POC 28.6  27 - 31.2 pg   MCHC 30.3 (*) 31.8 - 35.4 g/dL   RDW, POC 04.5     Platelet Count, POC 113 (*) 142 - 424 K/uL   MPV 12.1  0 - 99.8 fL  IFOBT (OCCULT BLOOD)      Result Value Range   IFOBT Negative       HGB 11.2 on 05/26/12.  She denies dark tarry stools. Heavier menses recently. LMP 10/12/12.       Assessment & Plan:  Abraham Margulies is a 42 y.o. female Anemia - Plan: Ambulatory referral to Gynecology, IFOBT POC (occult bld, rslt in office)  RUQ abdominal pain - Plan: POCT CBC, Comprehensive metabolic panel, Ambulatory referral to Gastroenterology  Otalgia of both ears  Breast cancer screening - Plan: MM Digital Screening  Heavy menses - Plan: Ambulatory referral to Gynecology   RUQ pain with rad'd to back by hx. No chest pain. Prior eval by cards reviewed, and sx's in office sound to be GI in nature.  RUQ ttp, but 1 month of sx's, afebrile, and no leukocytosis. Suspect gallbladder cause, but negative u/s prior.  Will refer to GI for eval/possible HIDA scan.  rtc or er sooner if n/v, fever, or worsening pain.  Avoid fried/fatty foods until seen by GI. Understanding expressed. Anemia, but heme negative stool.   Otalgia - likely etd with allergic rhinitis.  Trial of otc claritin 1 qd.  Will refer for mmg, but discussed need to establish CPE with PCP to discuss other health maintenance issues/labs.   Anemia - heavy menses by hx, but no recent changes - will refer to OBGYN for eval. Will also refer to GI as above for abd pain.  Will need recheck cbc in next week. Orthostatic/dizziness precautions - to go to ER or RTC sooner if sx's recur.   Patient Instructions  We will refer you to a stomach specialist for your abdominal pain. Avoid fried/fatty foods as these could make your pain worse if this is from your gallbladder.  Return to the clinic or go to the nearest emergency room if any of your symptoms worsen or  new symptoms occur.   We will also refer you to a gynecologist for the heavy periods and anemia. If not checked outside of here - return in next 1 week to repeat your blood count - sooner or to emergency room if you become lightheaded or dizzy.  claritin or zyrtec once per day for allergy symptoms.  We will refer you for a mammogram, but schedule/return for a physical with your primary care provider to discuss other health maintenance items and labs.

## 2012-11-12 ENCOUNTER — Other Ambulatory Visit: Payer: Self-pay | Admitting: Gastroenterology

## 2012-11-12 DIAGNOSIS — R1011 Right upper quadrant pain: Secondary | ICD-10-CM

## 2012-11-19 ENCOUNTER — Ambulatory Visit (INDEPENDENT_AMBULATORY_CARE_PROVIDER_SITE_OTHER): Payer: 59 | Admitting: Physician Assistant

## 2012-11-19 ENCOUNTER — Encounter: Payer: Self-pay | Admitting: Family Medicine

## 2012-11-19 VITALS — BP 154/98 | HR 84 | Temp 98.5°F | Resp 18 | Ht 65.0 in | Wt 213.0 lb

## 2012-11-19 DIAGNOSIS — B373 Candidiasis of vulva and vagina: Secondary | ICD-10-CM

## 2012-11-19 DIAGNOSIS — K5900 Constipation, unspecified: Secondary | ICD-10-CM

## 2012-11-19 DIAGNOSIS — K219 Gastro-esophageal reflux disease without esophagitis: Secondary | ICD-10-CM | POA: Insufficient documentation

## 2012-11-19 DIAGNOSIS — M549 Dorsalgia, unspecified: Secondary | ICD-10-CM

## 2012-11-19 DIAGNOSIS — R300 Dysuria: Secondary | ICD-10-CM

## 2012-11-19 LAB — POCT UA - MICROSCOPIC ONLY
Crystals, Ur, HPF, POC: NEGATIVE
Yeast, UA: POSITIVE

## 2012-11-19 LAB — POCT URINALYSIS DIPSTICK
Blood, UA: NEGATIVE
Protein, UA: NEGATIVE
Spec Grav, UA: 1.02
Urobilinogen, UA: 0.2

## 2012-11-19 MED ORDER — CYCLOBENZAPRINE HCL 5 MG PO TABS: 5.0000 mg | ORAL_TABLET | Freq: Three times a day (TID) | ORAL | Status: DC | PRN

## 2012-11-19 MED ORDER — MELOXICAM 7.5 MG PO TABS
7.5000 mg | ORAL_TABLET | Freq: Two times a day (BID) | ORAL | Status: DC
Start: 2012-11-19 — End: 2013-10-24

## 2012-11-19 MED ORDER — POLYETHYLENE GLYCOL 3350 17 GM/SCOOP PO POWD: 17.0000 g | Freq: Every day | ORAL | Status: DC

## 2012-11-19 MED ORDER — FLUCONAZOLE 150 MG PO TABS
150.0000 mg | ORAL_TABLET | Freq: Once | ORAL | Status: DC
Start: 2012-11-19 — End: 2013-10-24

## 2012-11-19 NOTE — Progress Notes (Signed)
[address] [place] [address]   Phone [phone]  Subjective:    Patient ID: [name] [name], female    DOB: 11-26-1970, 41 y.o.   MRN: [id number]  HPI Pt presents to clinic with L sided back pain for about 1 wk - it started a few days prior to her menses and she thought it was related to that but her menses has finished and she still has the back pain.  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  She is currently having increased pain with movement and with urination and bowel movements.  The pain goes into her L hip and down a little into her L leg but she is having no paresthesias or weakness.  She has taken tylenol which helps a little with the pain.  Her BP is typically well controlled but it does go up when she is in pain.   Review of Systems  Constitutional: Negative for fever and chills.  Gastrointestinal: Positive for constipation.  Genitourinary: Negative for dysuria, urgency, frequency, hematuria and vaginal discharge.  Musculoskeletal: Positive for back pain. Negative for gait problem.  Neurological: Negative for weakness.       Objective:   Physical Exam  Vitals reviewed. Constitutional: She is oriented to person, place, and time. She appears well-developed and well-nourished.  HENT:  Head: Normocephalic and atraumatic.  Right Ear: External ear normal.  Left Ear: External ear normal.  Eyes: Conjunctivae are normal.  Neck: Neck supple.  Cardiovascular: Normal rate, regular rhythm and normal heart sounds.   No murmur heard. Pulmonary/Chest: Effort normal and breath sounds normal.  Abdominal: Soft. There is no tenderness. There is no CVA tenderness.  Musculoskeletal:       Lumbar back: She exhibits tenderness (L paralumbar spinal muscles) and pain (with ROM - extension>flexion, L lateral bending >R). She exhibits normal range of motion, no bony tenderness and no swelling.       Back:  Neurological: She is alert and oriented to  person, place, and time.  Skin: Skin is warm and dry.  Psychiatric: She has a normal mood and affect. Her behavior is normal. Judgment and thought content normal.   Results for orders placed in visit on 11/19/12  POCT URINALYSIS DIPSTICK      Result Value Range   Color, UA yellow     Clarity, UA clear     Glucose, UA neg     Bilirubin, UA neg     Ketones, UA neg     Spec Grav, UA 1.020     Blood, UA neg     pH, UA 7.5     Protein, UA neg     Urobilinogen, UA 0.2     Nitrite, UA neg     Leukocytes, UA Negative    POCT UA - MICROSCOPIC ONLY      Result Value Range   WBC, Ur, HPF, POC 1-2     RBC, urine, microscopic 0-1     Bacteria, U Microscopic trace     Mucus, UA neg     Epithelial cells, urine per micros 2-4     Crystals, Ur, HPF, POC neg     Casts, Ur, LPF, POC neg     Yeast, UA positive            Assessment & Plan:  Dysuria -urine sample is clear - Plan: POCT urinalysis dipstick, POCT UA - Microscopic Only  Acute back pain - I suspect  musculoskeletal in origin and it is being aggravated by straining due to constipation-pt to use heat -  Plan: cyclobenzaprine (FLEXERIL) 5 MG tablet, meloxicam (MOBIC) 7.5 MG tablet, polyethylene glycol powder (GLYCOLAX/MIRALAX) powder  Yeast vaginitis - incidental finding on urine- Plan: fluconazole (DIFLUCAN) 150 MG tablet  Unspecified constipation - pt to increase fluid intake- Plan: polyethylene glycol powder (GLYCOLAX/MIRALAX) powder  HTN - pt to monitor at home  RTC 1 wk if not better - sooner if worse.  Benny Lennert PA-C 11/19/2012 9:42 AM

## 2012-11-26 ENCOUNTER — Encounter (HOSPITAL_COMMUNITY): Payer: 59

## 2012-11-27 ENCOUNTER — Encounter: Payer: Self-pay | Admitting: Family Medicine

## 2013-01-10 ENCOUNTER — Ambulatory Visit (INDEPENDENT_AMBULATORY_CARE_PROVIDER_SITE_OTHER): Payer: 59 | Admitting: Family Medicine

## 2013-01-10 VITALS — BP 144/86 | HR 75 | Temp 97.7°F | Resp 18 | Ht 65.0 in | Wt 211.0 lb

## 2013-01-10 DIAGNOSIS — N760 Acute vaginitis: Secondary | ICD-10-CM

## 2013-01-10 DIAGNOSIS — D649 Anemia, unspecified: Secondary | ICD-10-CM

## 2013-01-10 DIAGNOSIS — R109 Unspecified abdominal pain: Secondary | ICD-10-CM

## 2013-01-10 DIAGNOSIS — B9689 Other specified bacterial agents as the cause of diseases classified elsewhere: Secondary | ICD-10-CM

## 2013-01-10 LAB — POCT URINALYSIS DIPSTICK
Bilirubin, UA: NEGATIVE
Blood, UA: NEGATIVE
Glucose, UA: NEGATIVE
Leukocytes, UA: NEGATIVE
Nitrite, UA: NEGATIVE
Urobilinogen, UA: 0.2

## 2013-01-10 LAB — POCT WET PREP WITH KOH
KOH Prep POC: NEGATIVE
Trichomonas, UA: NEGATIVE

## 2013-01-10 LAB — POCT UA - MICROSCOPIC ONLY: Crystals, Ur, HPF, POC: NEGATIVE

## 2013-01-10 LAB — POCT CBC
HCT, POC: 37.7 % (ref 37.7–47.9)
Hemoglobin: 11.2 g/dL — AB (ref 12.2–16.2)
Lymph, poc: 2.2 (ref 0.6–3.4)
MCH, POC: 28.7 pg (ref 27–31.2)
MCHC: 29.7 g/dL — AB (ref 31.8–35.4)
POC LYMPH PERCENT: 57.3 %L — AB (ref 10–50)
POC MID %: 9.4 %M (ref 0–12)
RDW, POC: 13.6 %
WBC: 3.9 10*3/uL — AB (ref 4.6–10.2)

## 2013-01-10 MED ORDER — METRONIDAZOLE 500 MG PO TABS: 500.0000 mg | ORAL_TABLET | Freq: Three times a day (TID) | ORAL | Status: DC

## 2013-01-10 NOTE — Patient Instructions (Addendum)
Drink lots of fluids  Take the metronidazole 500 mg 3 times daily  Take nonprescription over-the-counter iron one daily

## 2013-01-10 NOTE — Progress Notes (Signed)
Subjective: 42 year old lady from [place] who is here complaining of low abdominal pain. It is been going on for a while. Nonspecific. Some vaginal discharge and discomfort. No dyspareunia. Last menstrual cycle was a couple of weeks ago.  She says I told him to lose weight and she's lost 35 pounds.  Objective: Pleasant lady in no major distress. Abdomen has normal bowel sounds. Soft without masses. Mild suprapubic tenderness. No CVA tenderness. Pelvic normal external genitalia. Vaginal mucosa had some nonspecific whitish and clear mucoid discharge. Wet prep taken. Bimanual exam feels no adnexal masses but she is tender midline in the area of the uterus.  Assessment: Nonspecific vaginitis Low abdominal pain  Plan: UA and wet prep and CBC  Results for orders placed in visit on 01/10/13  POCT WET PREP WITH KOH      Result Value Range   Trichomonas, UA Negative     Clue Cells Wet Prep HPF POC 7-9     Epithelial Wet Prep HPF POC 2-3     Yeast Wet Prep HPF POC neg     Bacteria Wet Prep HPF POC 2+     RBC Wet Prep HPF POC 1-2     WBC Wet Prep HPF POC 1-4     KOH Prep POC Negative    POCT URINALYSIS DIPSTICK      Result Value Range   Color, UA yellow     Clarity, UA clear     Glucose, UA neg     Bilirubin, UA neg     Ketones, UA neg     Spec Grav, UA 1.020     Blood, UA neg     pH, UA 6.0     Protein, UA neg     Urobilinogen, UA 0.2     Nitrite, UA neg     Leukocytes, UA Negative    POCT UA - MICROSCOPIC ONLY      Result Value Range   WBC, Ur, HPF, POC 0-1     RBC, urine, microscopic 0-1     Bacteria, U Microscopic trace     Mucus, UA neg     Epithelial cells, urine per micros 1-4     Crystals, Ur, HPF, POC neg     Casts, Ur, LPF, POC neg     Yeast, UA neg    POCT CBC      Result Value Range   WBC 3.9 (*) 4.6 - 10.2 K/uL   Lymph, poc 2.2  0.6 - 3.4   POC LYMPH PERCENT 57.3 (*) 10 - 50 %L   MID (cbc) 0.4  0 - 0.9   POC MID % 9.4  0 - 12 %M   POC Granulocyte 1.3 (*) 2 -  6.9   Granulocyte percent 33.3 (*) 37 - 80 %G   RBC 3.90 (*) 4.04 - 5.48 M/uL   Hemoglobin 11.2 (*) 12.2 - 16.2 g/dL   HCT, POC 96.0  45.4 - 47.9 %   MCV 96.6  80 - 97 fL   MCH, POC 28.7  27 - 31.2 pg   MCHC 29.7 (*) 31.8 - 35.4 g/dL   RDW, POC 09.8     Platelet Count, POC 168  142 - 424 K/uL   MPV 12.5  0 - 99.8 fL   Assessment: Bacterial vaginosis Pelvic pain Mild anemia, improved from before  Plan: Recommended she take iron. Will prescribe metronidazole. Return if worse low abdominal pain which circumstances would need to do a scan.

## 2013-04-12 ENCOUNTER — Ambulatory Visit: Payer: 59

## 2013-04-12 ENCOUNTER — Ambulatory Visit (INDEPENDENT_AMBULATORY_CARE_PROVIDER_SITE_OTHER): Payer: 59 | Admitting: Emergency Medicine

## 2013-04-12 VITALS — BP 130/80 | HR 50 | Temp 98.0°F | Resp 16 | Ht 65.0 in | Wt 213.0 lb

## 2013-04-12 DIAGNOSIS — K219 Gastro-esophageal reflux disease without esophagitis: Secondary | ICD-10-CM

## 2013-04-12 DIAGNOSIS — R0781 Pleurodynia: Secondary | ICD-10-CM

## 2013-04-12 DIAGNOSIS — R079 Chest pain, unspecified: Secondary | ICD-10-CM

## 2013-04-12 DIAGNOSIS — M549 Dorsalgia, unspecified: Secondary | ICD-10-CM

## 2013-04-12 MED ORDER — CYCLOBENZAPRINE HCL 5 MG PO TABS: ORAL_TABLET | ORAL | Status: DC

## 2013-04-12 MED ORDER — OMEPRAZOLE 20 MG PO CPDR: 20.0000 mg | DELAYED_RELEASE_CAPSULE | Freq: Every day | ORAL | Status: DC

## 2013-04-12 NOTE — Patient Instructions (Signed)
Gastroesophageal Reflux Disease, Adult  Gastroesophageal reflux disease (GERD) happens when acid from your stomach flows up into the esophagus. When acid comes in contact with the esophagus, the acid causes soreness (inflammation) in the esophagus. Over time, GERD may create small holes (ulcers) in the lining of the esophagus.  CAUSES   · Increased body weight. This puts pressure on the stomach, making acid rise from the stomach into the esophagus.  · Smoking. This increases acid production in the stomach.  · Drinking alcohol. This causes decreased pressure in the lower esophageal sphincter (valve or ring of muscle between the esophagus and stomach), allowing acid from the stomach into the esophagus.  · Late evening meals and a full stomach. This increases pressure and acid production in the stomach.  · A malformed lower esophageal sphincter.  Sometimes, no cause is found.  SYMPTOMS   · Burning pain in the lower part of the mid-chest behind the breastbone and in the mid-stomach area. This may occur twice a week or more often.  · Trouble swallowing.  · Sore throat.  · Dry cough.  · Asthma-like symptoms including chest tightness, shortness of breath, or wheezing.  DIAGNOSIS   Your caregiver may be able to diagnose GERD based on your symptoms. In some cases, X-rays and other tests may be done to check for complications or to check the condition of your stomach and esophagus.  TREATMENT   Your caregiver may recommend over-the-counter or prescription medicines to help decrease acid production. Ask your caregiver before starting or adding any new medicines.   HOME CARE INSTRUCTIONS   · Change the factors that you can control. Ask your caregiver for guidance concerning weight loss, quitting smoking, and alcohol consumption.  · Avoid foods and drinks that make your symptoms worse, such as:  · Caffeine or alcoholic drinks.  · Chocolate.  · Peppermint or mint flavorings.  · Garlic and onions.  · Spicy foods.  · Citrus fruits,  such as oranges, lemons, or limes.  · Tomato-based foods such as sauce, chili, salsa, and pizza.  · Fried and fatty foods.  · Avoid lying down for the 3 hours prior to your bedtime or prior to taking a nap.  · Eat small, frequent meals instead of large meals.  · Wear loose-fitting clothing. Do not wear anything tight around your waist that causes pressure on your stomach.  · Raise the head of your bed 6 to 8 inches with wood blocks to help you sleep. Extra pillows will not help.  · Only take over-the-counter or prescription medicines for pain, discomfort, or fever as directed by your caregiver.  · Do not take aspirin, ibuprofen, or other nonsteroidal anti-inflammatory drugs (NSAIDs).  SEEK IMMEDIATE MEDICAL CARE IF:   · You have pain in your arms, neck, jaw, teeth, or back.  · Your pain increases or changes in intensity or duration.  · You develop nausea, vomiting, or sweating (diaphoresis).  · You develop shortness of breath, or you faint.  · Your vomit is green, yellow, black, or looks like coffee grounds or blood.  · Your stool is red, bloody, or black.  These symptoms could be signs of other problems, such as heart disease, gastric bleeding, or esophageal bleeding.  MAKE SURE YOU:   · Understand these instructions.  · Will watch your condition.  · Will get help right away if you are not doing well or get worse.  Document Released: 03/29/2005 Document Revised: 09/11/2011 Document Reviewed: 01/06/2011  [employer]® Patient   Information ©2014 [employer].

## 2013-04-12 NOTE — Progress Notes (Signed)
Subjective:  This chart was scribed for [name] Lites, MD by [name] [name], ED Scribe. This patient was seen in room Room 9 info not found and the patient's care was started 8:23 AM.    Patient ID: [name] [name], female    DOB: 07/24/1970, 42 y.o.   MRN: [id number]  HPI HPI Comments: [name] [name] is a 42 y.o. female who presents to [facility] complaining of pain on her right anterior lateral ribs that started 2 weeks ago. Pt also states the pain radiates to her back, and is worsened with any turning and twisting. She states she was sent for an ultrasound and with suspicion of possible difficulty with her gallbladder, but test results came back negative. Pt is currently a housekeeper, and works for a [employer]. Pt states when she eats, she experiences an "acid" feeling. She is currently not taking any OTC medications for these symptoms. Pt denies these symptoms worsening when she sleeps. Pt also reports having a hx of heart issues, which she sees a cardiologist regularly. Pt states she is not currently pregnant. Pt denies SOB, cough, nausea, emesis.  Cardiologist Dr. Valera [name]  .   Review of Systems  Past Medical History  Diagnosis Date  . HTN (hypertension)   . Hx of cardiovascular stress test     a. ETT-Myoview 05/28/12: Low risk study with a small, mild fixed basal inferior perfusion defect most likely representing soft tissue attenuation with inferolateral HK (wall motion abnormality does not correlate with inferior perfusion defect), EF 49%.  Marland Kitchen Hx of echocardiogram     a. Echocardiogram 06/11/12: EF 50-55%, normal wall motion, grade 1 diastolic dysfunction, trivial AI, mild MR, mild LAE, mild RAE.    History   Social History  . Marital Status: Married    Spouse Name: N/A    Number of Children: N/A  . Years of Education: N/A   Occupational History  . Housekeeper     at hotel   Social History Main Topics  . Smoking status: Never Smoker   . Smokeless tobacco: Never Used  .  Alcohol Use: No  . Drug Use: No  . Sexual Activity: No   Other Topics Concern  . Not on file   Social History Narrative  . No narrative on file   History reviewed. No pertinent past surgical history.   Family History  Problem Relation Age of Onset  . Coronary artery disease Mother   . Other Brother     fluid around heart    Patient Active Problem List   Diagnosis Date Noted  . GERD (gastroesophageal reflux disease) 11/19/2012  . Chest pain 05/21/2012  . Obesity, morbid (more than 100 lbs over ideal weight or BMI > 40) 05/30/2011  . Essential hypertension 04/29/2009          Objective:   Physical Exam  Constitutional: She is oriented to person, place, and time. She appears well-developed and well-nourished. No distress.  HENT:  Head: Normocephalic.  Eyes: Conjunctivae are normal. Pupils are equal, round, and reactive to light. No scleral icterus.  Neck: Normal range of motion. Neck supple. No thyromegaly present.  Cardiovascular: Exam reveals no gallop and no friction rub.   No murmur heard. Mitral click Irregular heart rhythm  Pulmonary/Chest: Effort normal and breath sounds normal. No respiratory distress. She has no wheezes. She has no rales.  Tenderness to palpation over right lateral ribs 7 and 8  Abdominal: Soft. Bowel sounds are normal. She exhibits no distension. There is no  tenderness. There is no rebound.  Musculoskeletal: Normal range of motion.  Neurological: She is alert and oriented to person, place, and time.  Skin: Skin is warm and dry. No rash noted.  Psychiatric: She has a normal mood and affect. Her behavior is normal.   [facility] reading (PRIMARY) by  Dr. Cleta [name] no fractures are seen lungs appear normal.         Assessment & Plan:  I did give her a muscle relaxant to take at night. We'll give her advice to take Tylenol for pain. I did not put her on a nonsteroidal because she is already suffering from heartburn. We'll also treat her GI symptoms  with omeprazole one a day.

## 2013-05-03 ENCOUNTER — Other Ambulatory Visit: Payer: Self-pay | Admitting: Cardiovascular Disease

## 2013-05-06 ENCOUNTER — Encounter (HOSPITAL_COMMUNITY)
Admission: RE | Admit: 2013-05-06 | Discharge: 2013-05-06 | Disposition: A | Discharge status: Home / Self Care | Payer: 59 | Source: Ambulatory Visit | Attending: Gastroenterology | Admitting: Gastroenterology

## 2013-05-06 ENCOUNTER — Encounter (HOSPITAL_COMMUNITY): Payer: Self-pay

## 2013-05-06 DIAGNOSIS — R1011 Right upper quadrant pain: Secondary | ICD-10-CM

## 2013-05-06 HISTORY — DX: Right upper quadrant pain: R10.11

## 2013-05-06 MED ORDER — TECHNETIUM TC 99M MEBROFENIN IV KIT
5.5000 | PACK | Freq: Once | INTRAVENOUS | Status: AC | PRN
  Administered 2013-05-06: 5.5 via INTRAVENOUS

## 2013-05-06 MED ORDER — SINCALIDE 5 MCG IJ SOLR: 0.0200 ug/kg | Freq: Once | INTRAMUSCULAR | Status: DC

## 2013-05-17 ENCOUNTER — Ambulatory Visit (INDEPENDENT_AMBULATORY_CARE_PROVIDER_SITE_OTHER): Payer: 59 | Admitting: Family Medicine

## 2013-05-17 VITALS — BP 150/100 | HR 81 | Temp 98.3°F | Resp 16 | Ht 64.75 in | Wt 212.0 lb

## 2013-05-17 DIAGNOSIS — R109 Unspecified abdominal pain: Secondary | ICD-10-CM

## 2013-05-17 DIAGNOSIS — M79609 Pain in unspecified limb: Secondary | ICD-10-CM

## 2013-05-17 DIAGNOSIS — M79604 Pain in right leg: Secondary | ICD-10-CM

## 2013-05-17 DIAGNOSIS — R110 Nausea: Secondary | ICD-10-CM

## 2013-05-17 LAB — COMPREHENSIVE METABOLIC PANEL
ALT: 19 U/L (ref 0–35)
AST: 22 U/L (ref 0–37)
Albumin: 4.2 g/dL (ref 3.5–5.2)
CO2: 26 mEq/L (ref 19–32)
Calcium: 9.1 mg/dL (ref 8.4–10.5)
Chloride: 106 mEq/L (ref 96–112)
Glucose, Bld: 82 mg/dL (ref 70–99)
Potassium: 4.5 mEq/L (ref 3.5–5.3)
Sodium: 138 mEq/L (ref 135–145)
Total Bilirubin: 0.5 mg/dL (ref 0.3–1.2)
Total Protein: 7.4 g/dL (ref 6.0–8.3)

## 2013-05-17 LAB — POCT URINALYSIS DIPSTICK
Leukocytes, UA: NEGATIVE
Nitrite, UA: NEGATIVE
Protein, UA: NEGATIVE
Spec Grav, UA: 1.015
Urobilinogen, UA: 0.2

## 2013-05-17 LAB — POCT CBC
Granulocyte percent: 43.6 %G (ref 37–80)
MID (cbc): 0.2 (ref 0–0.9)
MPV: 11.9 fL (ref 0–99.8)
POC Granulocyte: 1.5 — AB (ref 2–6.9)
POC LYMPH PERCENT: 50 %L (ref 10–50)
POC MID %: 6.4 %M (ref 0–12)
Platelet Count, POC: 196 10*3/uL (ref 142–424)
RDW, POC: 12.3 %
WBC: 3.5 10*3/uL — AB (ref 4.6–10.2)

## 2013-05-17 LAB — POCT UA - MICROSCOPIC ONLY
Casts, Ur, LPF, POC: NEGATIVE
Yeast, UA: NEGATIVE

## 2013-05-17 NOTE — Progress Notes (Signed)
Subjective: Almost 42 year old [place] lady who is here complaining of 2 weeks of right upper quadrant abdominal pain. Actually this been going on much longer than this. She's been worked up a couple of times in recent years with ultrasounds of the abdomen which were normal. 2 weeks ago she had a hepatobiliary scan done, which incidentally had been ordered back in May by Dr. Loreta Ave, which was normal. She has nausea but no vomiting. She is constipated but no diarrhea. She hurts in the right upper quadrant around to the right flank with pains radiating down. She says it hurts only down to her foot sometimes  Objective: Obese pleasant alert after lady in no acute distress. She says she also has had headaches. HEENT normal TMs. Neck supple without nodes. Chest clear. Abdomen has normal bowel sounds. Soft. No masses. He is tender in the right upper quadrant a little bit lateral to where our would expect the gallbladder to be, and down into the McBurney's point area. Straight leg raising negative. No CVA tenderness.  Assessment: Right abdominal pain, etiology undetermined Headache Nonspecific right leg pain Elevated blood pressure (did not take her medicine this morning yet)  Plan: CBC C. met urinalysis and consider CT  Results for orders placed in visit on 05/17/13  POCT CBC      Result Value Range   WBC 3.5 (*) 4.6 - 10.2 K/uL   Lymph, poc 1.7  0.6 - 3.4   POC LYMPH PERCENT 50.0  10 - 50 %L   MID (cbc) 0.2  0 - 0.9   POC MID % 6.4  0 - 12 %M   POC Granulocyte 1.5 (*) 2 - 6.9   Granulocyte percent 43.6  37 - 80 %G   RBC 4.14  4.04 - 5.48 M/uL   Hemoglobin 12.4  12.2 - 16.2 g/dL   HCT, POC 16.1  09.6 - 47.9 %   MCV 97.5 (*) 80 - 97 fL   MCH, POC 30.0  27 - 31.2 pg   MCHC 30.7 (*) 31.8 - 35.4 g/dL   RDW, POC 04.5     Platelet Count, POC 196  142 - 424 K/uL   MPV 11.9  0 - 99.8 fL  POCT URINALYSIS DIPSTICK      Result Value Range   Color, UA yellow     Clarity, UA clear     Glucose, UA  neg     Bilirubin, UA neg     Ketones, UA neg     Spec Grav, UA 1.015     Blood, UA neg     pH, UA 7.0     Protein, UA neg     Urobilinogen, UA 0.2     Nitrite, UA neg     Leukocytes, UA Negative    POCT UA - MICROSCOPIC ONLY      Result Value Range   WBC, Ur, HPF, POC 0-1     RBC, urine, microscopic neg     Bacteria, U Microscopic neg     Mucus, UA neg     Epithelial cells, urine per micros 4-10     Crystals, Ur, HPF, POC neg     Casts, Ur, LPF, POC neg     Yeast, UA neg

## 2013-05-17 NOTE — Patient Instructions (Signed)
No new treatment at this time.  The remaining labs will be back in a couple of days and I will let you know them  We will schedule you a CT scan of the abdomen and I will let you know the results of those.  If not better in 2 weeks please return. If worse at anytime return sooner.

## 2013-05-19 ENCOUNTER — Encounter: Payer: Self-pay | Admitting: Radiology

## 2013-05-21 ENCOUNTER — Ambulatory Visit
Admission: RE | Admit: 2013-05-21 | Discharge: 2013-05-21 | Disposition: A | Discharge status: Home / Self Care | Payer: 59 | Source: Ambulatory Visit | Attending: Family Medicine | Admitting: Family Medicine

## 2013-05-21 DIAGNOSIS — R109 Unspecified abdominal pain: Secondary | ICD-10-CM

## 2013-05-21 MED ORDER — IOHEXOL 300 MG/ML  SOLN
100.0000 mL | Freq: Once | INTRAMUSCULAR | Status: AC | PRN
  Administered 2013-05-21: 100 mL via INTRAVENOUS

## 2013-05-22 ENCOUNTER — Telehealth: Payer: Self-pay

## 2013-05-22 NOTE — Telephone Encounter (Signed)
pts husband calling for scan results. Said wife got a 'stomach xray' yesterday and they wanted results   Please call  bf

## 2013-06-28 ENCOUNTER — Ambulatory Visit (INDEPENDENT_AMBULATORY_CARE_PROVIDER_SITE_OTHER): Payer: 59 | Admitting: Family Medicine

## 2013-06-28 VITALS — BP 142/90 | HR 86 | Temp 98.6°F | Resp 18 | Ht 64.5 in | Wt 216.4 lb

## 2013-06-28 DIAGNOSIS — R079 Chest pain, unspecified: Secondary | ICD-10-CM

## 2013-06-28 DIAGNOSIS — I10 Essential (primary) hypertension: Secondary | ICD-10-CM

## 2013-06-28 DIAGNOSIS — M940 Chondrocostal junction syndrome [Tietze]: Secondary | ICD-10-CM

## 2013-06-28 DIAGNOSIS — I493 Ventricular premature depolarization: Secondary | ICD-10-CM

## 2013-06-28 DIAGNOSIS — K219 Gastro-esophageal reflux disease without esophagitis: Secondary | ICD-10-CM

## 2013-06-28 DIAGNOSIS — I4949 Other premature depolarization: Secondary | ICD-10-CM

## 2013-06-28 MED ORDER — DICLOFENAC SODIUM 75 MG PO TBEC: 75.0000 mg | DELAYED_RELEASE_TABLET | Freq: Two times a day (BID) | ORAL | Status: DC

## 2013-06-28 MED ORDER — TRAMADOL HCL 50 MG PO TABS: ORAL_TABLET | ORAL | Status: DC

## 2013-06-28 MED ORDER — METOPROLOL TARTRATE 25 MG PO TABS: ORAL_TABLET | ORAL | Status: DC

## 2013-06-28 NOTE — Patient Instructions (Addendum)
Continue the losartan and verapamil  Take the aspirin 81 mg one daily  Resume using your omeprazole 1 daily for reflux  Take diclofenac one at breakfast and one at supper for inflammation of chest wall  Take the metoprolol one half tablet twice daily for the irregular heartbeats  If you get abruptly worse go to the emergency room.  Return in about 2 or 3 weeks for me to recheck your heart in the middle of January, sooner if problems

## 2013-06-28 NOTE — Progress Notes (Signed)
Subjective: 42 year old lady previously known to me who comes in with chest pains. She has had chest pain problems in the past, has had a couple of cardiology workups. She has been hurting her just last few days. 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 She is hurting in the substernal area, feels like it is deep, continuous for the past couple of days. No other major symptoms associated with it.  Objective: Neck supple without nodes. Chest is clear to auscultation. Heart irregular do to frequent PVCs. Abdomen soft without masses tenderness. Mild substernal tenderness.  Assessment: Probable costochondritis and aggravated PVCs Possible GERD Atypical chest pains  Plan: EKG Frequent PVCs  Will treat symptomatically. She has had previous Cardiologic evaluations, and the ongoing nature of this seems atypical. If she gets worse we will have to do further testing. Will try to control the ectopic beats. She also has some anxiety issues, and I may need to an antianxiety medication.

## 2013-07-25 ENCOUNTER — Other Ambulatory Visit: Payer: Self-pay

## 2013-07-25 MED ORDER — VERAPAMIL HCL 120 MG PO TABS: 120.0000 mg | ORAL_TABLET | Freq: Every day | ORAL | Status: DC

## 2013-08-06 ENCOUNTER — Ambulatory Visit (INDEPENDENT_AMBULATORY_CARE_PROVIDER_SITE_OTHER): Payer: 59 | Admitting: Family Medicine

## 2013-08-06 VITALS — BP 132/84 | HR 89 | Temp 98.3°F | Resp 17 | Ht 65.0 in | Wt 212.0 lb

## 2013-08-06 DIAGNOSIS — R079 Chest pain, unspecified: Secondary | ICD-10-CM

## 2013-08-06 DIAGNOSIS — I10 Essential (primary) hypertension: Secondary | ICD-10-CM

## 2013-08-06 MED ORDER — LOSARTAN POTASSIUM 100 MG PO TABS: ORAL_TABLET | ORAL | Status: DC

## 2013-08-06 MED ORDER — TRAMADOL HCL 50 MG PO TABS: ORAL_TABLET | ORAL | Status: DC

## 2013-08-06 NOTE — Progress Notes (Signed)
Subjective: Patient continues to have pain in her right lower chest wall beneath the breast. This radiates around to the back. She has been worked up extensively over the last couple of years with gallbladder ultrasound, labs, CT of the abdomen and pelvis. Nothing seems to have progressed and gotten worse, but she is keeps on with these pains. She works doing housekeeping, and her husband has suggested that maybe it is the repetitive motions of the job that she has because her pain. This certainly could be the case, but I advised her not to try and change jobs until she lives up something different.  Objective: Pleasant alert lady in no major distress. Neck supple without nodes. Chest clear to auscultation. Heart regular without any murmurs or gallops but does have a moderate number of ectopic beats still. Chest wall is minimally tender at the distal sternum and around to the right. Her abdomen is soft without masses or tenderness.  Assessment: Atypical chest pains Ectopic beats  Plan: I do not feel any other workup is indicated at this time. Will continue her current medications. Return in about 6 months, sooner if problems arise. Take the tramadol only if needed for bad pain.

## 2013-08-06 NOTE — Patient Instructions (Signed)
Continue your same medications.  Return in about 6 months for a recheck, sooner if problems arise

## 2013-09-29 ENCOUNTER — Other Ambulatory Visit: Payer: Self-pay | Admitting: Cardiovascular Disease

## 2013-10-24 ENCOUNTER — Emergency Department (HOSPITAL_COMMUNITY): Payer: 59

## 2013-10-24 ENCOUNTER — Emergency Department (HOSPITAL_COMMUNITY)
Admission: EM | Admit: 2013-10-24 | Discharge: 2013-10-24 | Disposition: A | Discharge status: Home / Self Care | Payer: 59 | Attending: Emergency Medicine | Admitting: Emergency Medicine

## 2013-10-24 ENCOUNTER — Encounter (HOSPITAL_COMMUNITY): Payer: Self-pay | Admitting: Emergency Medicine

## 2013-10-24 DIAGNOSIS — R110 Nausea: Secondary | ICD-10-CM | POA: Insufficient documentation

## 2013-10-24 DIAGNOSIS — R1011 Right upper quadrant pain: Secondary | ICD-10-CM | POA: Insufficient documentation

## 2013-10-24 DIAGNOSIS — R109 Unspecified abdominal pain: Secondary | ICD-10-CM

## 2013-10-24 DIAGNOSIS — Z79899 Other long term (current) drug therapy: Secondary | ICD-10-CM | POA: Insufficient documentation

## 2013-10-24 DIAGNOSIS — J029 Acute pharyngitis, unspecified: Secondary | ICD-10-CM | POA: Insufficient documentation

## 2013-10-24 DIAGNOSIS — R197 Diarrhea, unspecified: Secondary | ICD-10-CM | POA: Insufficient documentation

## 2013-10-24 DIAGNOSIS — D649 Anemia, unspecified: Secondary | ICD-10-CM

## 2013-10-24 DIAGNOSIS — G8929 Other chronic pain: Secondary | ICD-10-CM | POA: Insufficient documentation

## 2013-10-24 DIAGNOSIS — Z791 Long term (current) use of non-steroidal anti-inflammatories (NSAID): Secondary | ICD-10-CM | POA: Insufficient documentation

## 2013-10-24 DIAGNOSIS — R5381 Other malaise: Secondary | ICD-10-CM | POA: Insufficient documentation

## 2013-10-24 DIAGNOSIS — I10 Essential (primary) hypertension: Secondary | ICD-10-CM | POA: Insufficient documentation

## 2013-10-24 DIAGNOSIS — R5383 Other fatigue: Secondary | ICD-10-CM

## 2013-10-24 DIAGNOSIS — N898 Other specified noninflammatory disorders of vagina: Secondary | ICD-10-CM | POA: Insufficient documentation

## 2013-10-24 LAB — COMPREHENSIVE METABOLIC PANEL
ALK PHOS: 67 U/L (ref 39–117)
ALT: 23 U/L (ref 0–35)
AST: 30 U/L (ref 0–37)
Albumin: 3.8 g/dL (ref 3.5–5.2)
BUN: 12 mg/dL (ref 6–23)
CHLORIDE: 100 meq/L (ref 96–112)
CO2: 24 mEq/L (ref 19–32)
Calcium: 9.4 mg/dL (ref 8.4–10.5)
Creatinine, Ser: 0.78 mg/dL (ref 0.50–1.10)
GLUCOSE: 85 mg/dL (ref 70–99)
POTASSIUM: 4.3 meq/L (ref 3.7–5.3)
Sodium: 137 mEq/L (ref 137–147)
Total Bilirubin: 0.3 mg/dL (ref 0.3–1.2)
Total Protein: 7.8 g/dL (ref 6.0–8.3)

## 2013-10-24 LAB — CBC WITH DIFFERENTIAL/PLATELET
Basophils Absolute: 0 10*3/uL (ref 0.0–0.1)
Basophils Relative: 1 % (ref 0–1)
EOS ABS: 0.2 10*3/uL (ref 0.0–0.7)
Eosinophils Relative: 6 % — ABNORMAL HIGH (ref 0–5)
HCT: 34.1 % — ABNORMAL LOW (ref 36.0–46.0)
HEMOGLOBIN: 10.9 g/dL — AB (ref 12.0–15.0)
LYMPHS ABS: 1.8 10*3/uL (ref 0.7–4.0)
Lymphocytes Relative: 46 % (ref 12–46)
MCH: 28.6 pg (ref 26.0–34.0)
MCHC: 32 g/dL (ref 30.0–36.0)
MCV: 89.5 fL (ref 78.0–100.0)
MONOS PCT: 9 % (ref 3–12)
Monocytes Absolute: 0.4 10*3/uL (ref 0.1–1.0)
NEUTROS ABS: 1.5 10*3/uL — AB (ref 1.7–7.7)
Neutrophils Relative %: 38 % — ABNORMAL LOW (ref 43–77)
Platelets: 189 10*3/uL (ref 150–400)
RBC: 3.81 MIL/uL — AB (ref 3.87–5.11)
RDW: 13.1 % (ref 11.5–15.5)
WBC: 4 10*3/uL (ref 4.0–10.5)

## 2013-10-24 LAB — LIPASE, BLOOD: Lipase: 31 U/L (ref 11–59)

## 2013-10-24 MED ORDER — OXYCODONE-ACETAMINOPHEN 5-325 MG PO TABS
1.0000 | ORAL_TABLET | Freq: Once | ORAL | Status: AC
  Administered 2013-10-24: 1 via ORAL
  Filled 2013-10-24: qty 1

## 2013-10-24 MED ORDER — MORPHINE SULFATE 4 MG/ML IJ SOLN
4.0000 mg | Freq: Once | INTRAMUSCULAR | Status: AC
  Administered 2013-10-24: 4 mg via INTRAVENOUS
  Filled 2013-10-24: qty 1

## 2013-10-24 MED ORDER — TRAMADOL HCL 50 MG PO TABS: 50.0000 mg | ORAL_TABLET | Freq: Four times a day (QID) | ORAL | Status: DC | PRN

## 2013-10-24 MED ORDER — ONDANSETRON HCL 4 MG/2ML IJ SOLN
4.0000 mg | Freq: Once | INTRAMUSCULAR | Status: AC
  Administered 2013-10-24: 4 mg via INTRAVENOUS
  Filled 2013-10-24: qty 2

## 2013-10-24 MED ORDER — SODIUM CHLORIDE 0.9 % IV BOLUS (SEPSIS)
1000.0000 mL | Freq: Once | INTRAVENOUS | Status: AC
  Administered 2013-10-24: 1000 mL via INTRAVENOUS

## 2013-10-24 MED ORDER — PROMETHAZINE HCL 25 MG PO TABS: 25.0000 mg | ORAL_TABLET | Freq: Four times a day (QID) | ORAL | Status: DC | PRN

## 2013-10-24 NOTE — ED Notes (Signed)
IV insertion attempted x 1, unable to obtain IV access and blood work. Another RN to attempt.

## 2013-10-24 NOTE — ED Notes (Addendum)
Pt reports RUQ pain x2 years. Has extensive work up in past for same pain. See Dr Darrol Angel note from Feb 2015. Last CT was 05/2013. Pt reports in week she has had weakness and some diarrhea. Reports pain 8/10.  Reports nausea, denies vomiting.

## 2013-10-24 NOTE — ED Provider Notes (Signed)
CSN: [id number]     Arrival date & time 10/24/13  1627 History   First MD Initiated Contact with Patient 10/24/13 1640     Chief Complaint  Patient presents with  . Abdominal Pain     (Consider location/radiation/quality/duration/timing/severity/associated sxs/prior Treatment) HPI  Patient to the ER with complaints of RUQ abdominal pain. She saw her PCP on Feb, 2015 for the same thing. This pain is not new and is chronic. She has had extensive work-up for these symptoms over the past few years with ultrasound of her gallbladder, labs CT af the abdomen and pelvis. They have not found any abnormalities. They question pain being musculoskeletal.    She presents to the ER because she feels that she is also having some weakness with diarrhea. She reports her pain being 8/10.She also reports having a dry cough, sore throat, feeling hot and colds chills. She is complaining feeling tired. She is currently on her menstrual cycle and describes her flow as being heavy.  Her vital signs are stable.    Past Medical History  Diagnosis Date  . HTN (hypertension)   . Hx of cardiovascular stress test     a. ETT-Myoview 05/28/12: Low risk study with a small, mild fixed basal inferior perfusion defect most likely representing soft tissue attenuation with inferolateral HK (wall motion abnormality does not correlate with inferior perfusion defect), EF 49%.  Marland Kitchen Hx of echocardiogram     a. Echocardiogram 06/11/12: EF 50-55%, normal wall motion, grade 1 diastolic dysfunction, trivial AI, mild MR, mild LAE, mild RAE.  . RUQ pain    History reviewed. No pertinent past surgical history. Family History  Problem Relation Age of Onset  . Coronary artery disease Mother   . Other Brother     fluid around heart   History  Substance Use Topics  . Smoking status: Never Smoker   . Smokeless tobacco: Never Used  . Alcohol Use: No   OB History   Grav Para Term Preterm Abortions TAB SAB Ect Mult Living         Review of Systems  Constitutional: Positive for fever (subjective) and chills.  HENT: Positive for sore throat.   Cardiovascular: Negative for chest pain and palpitations.  Gastrointestinal: Positive for nausea, abdominal pain and diarrhea. Negative for vomiting and constipation.  Genitourinary: Positive for vaginal bleeding. Negative for dysuria, frequency and vaginal discharge.  Skin: Negative for rash and wound.  Neurological: Negative for dizziness.  All other systems reviewed and are negative.     Allergies  Hctz  Home Medications   Prior to Admission medications   Medication Sig Start Date End Date Taking? Authorizing Provider  cyclobenzaprine (FLEXERIL) 5 MG tablet Take one tablet at night 04/12/13   Darlyne Russian, MD  diclofenac (VOLTAREN) 75 MG EC tablet Take 1 tablet (75 mg total) by mouth 2 (two) times daily. 06/28/13   Posey Boyer, MD  fluconazole (DIFLUCAN) 150 MG tablet Take 1 tablet (150 mg total) by mouth once. 11/19/12   Mancel Bale, PA-C  losartan (COZAAR) 100 MG tablet TAKE 1 TABLET FOR HEART AND BLOOD PRESSURE 08/06/13   Posey Boyer, MD  meloxicam (MOBIC) 7.5 MG tablet Take 1 tablet (7.5 mg total) by mouth 2 (two) times daily. 11/19/12   Mancel Bale, PA-C  metoprolol tartrate (LOPRESSOR) 25 MG tablet Take one half tablet twice daily for irregular heartbeats 06/28/13   Posey Boyer, MD  metroNIDAZOLE (FLAGYL) 500 MG tablet Take 1 tablet (  500 mg total) by mouth 3 (three) times daily. 01/10/13   Posey Boyer, MD  omeprazole (PRILOSEC) 20 MG capsule Take 1 capsule (20 mg total) by mouth daily. 04/12/13   Darlyne Russian, MD  polyethylene glycol powder (GLYCOLAX/MIRALAX) powder Take 17 g by mouth daily. 11/19/12   Mancel Bale, PA-C  traMADol Veatrice Bourbon) 50 MG tablet Take one every 6 hours if needed for severe pain 08/06/13   Posey Boyer, MD  verapamil (CALAN) 120 MG tablet Take 1 tablet (120 mg total) by mouth daily. 07/25/13   Dorothy Spark, MD   BP  152/68  Pulse 81  Temp(Src) 98.2 F (36.8 C) (Oral)  Resp 16  SpO2 100%  LMP 10/24/2013 Physical Exam  Nursing note and vitals reviewed. Constitutional: She appears well-developed and well-nourished. No distress.  HENT:  Head: Normocephalic and atraumatic.  Eyes: Pupils are equal, round, and reactive to light.  Neck: Normal range of motion. Neck supple.  Cardiovascular: Normal rate and regular rhythm.  PMI is not displaced.  Exam reveals no gallop and no friction rub.   Pulmonary/Chest: Effort normal. No respiratory distress. She has no wheezes.  Abdominal: Soft. She exhibits no distension and no fluid wave. There is tenderness in the right upper quadrant. There is no rigidity, no guarding and no CVA tenderness.  Neurological: She is alert.  Skin: Skin is warm and dry.    ED Course  Procedures (including critical care time) Labs Review Labs Reviewed  CBC WITH DIFFERENTIAL - Abnormal; Notable for the following:    RBC 3.81 (*)    Hemoglobin 10.9 (*)    HCT 34.1 (*)    Neutrophils Relative % 38 (*)    Neutro Abs 1.5 (*)    Eosinophils Relative 6 (*)    All other components within normal limits  COMPREHENSIVE METABOLIC PANEL  LIPASE, BLOOD    Imaging Review Dg Chest 2 View  10/24/2013   CLINICAL DATA:  Three day history of shortness of breath, cough and congestion  EXAM: CHEST  2 VIEW  COMPARISON:  Prior chest x-ray 05/17/2007  FINDINGS: Interval enlargement of the cardiopericardial silhouette a frontal view. The lungs are clear. No focal airspace consolidation. No pleural effusion or pneumothorax. No acute osseous abnormality. Visualized upper abdominal bowel gas pattern is unremarkable.  IMPRESSION: Slight interval enlargement of the cardiopericardial silhouette on the frontal view. Differential considerations include developing borderline cardiomegaly or pericardial effusion.  Otherwise, the lungs are clear.   Electronically Signed   By: Jacqulynn Cadet M.D.   On: 10/24/2013  17:43     EKG Interpretation None      MDM   Final diagnoses:  Anemia  Chronic abdominal pain    Pt shows an almost  2 pt drop of hemoglobin in 5 months. She reports heavy menstruation but this is normal for her. This could be the cause of her feels more weak than normal. Chest xray showed possible pericardial effusion, Dr. Mingo Amber did bedside US (see his note) which rules this out. She also does not have any CP or SOB. Her vital signs are stable. Otherwise her chest xray does not show any signs of infection and her lab work is otherwise normal.  43 y.o.[name] [name]'s evaluation in the Emergency Department is complete. It has been determined that no acute conditions requiring further emergency intervention are present at this time. The patient/guardian have been advised of the diagnosis and plan. We have discussed signs and symptoms that warrant return  to the ED, such as changes or worsening in symptoms.  Vital signs are stable at discharge. Filed Vitals:   10/24/13 1630  BP: 152/68  Pulse: 81  Temp: 98.2 F (36.8 C)  Resp: 16    Patient/guardian has voiced understanding and agreed to follow-up with the PCP or specialist.     Linus Mako, PA-C 10/24/13 1835

## 2013-10-24 NOTE — Discharge Instructions (Signed)
Abdominal Pain, Women °Abdominal (stomach, pelvic, or belly) pain can be caused by many things. It is important to tell your doctor: °· The location of the pain. °· Does it come and go or is it present all the time? °· Are there things that start the pain (eating certain foods, exercise)? °· Are there other symptoms associated with the pain (fever, nausea, vomiting, diarrhea)? °All of this is helpful to know when trying to find the cause of the pain. °CAUSES  °· Stomach: virus or bacteria infection, or ulcer. °· Intestine: appendicitis (inflamed appendix), regional ileitis (Crohn's disease), ulcerative colitis (inflamed colon), irritable bowel syndrome, diverticulitis (inflamed diverticulum of the colon), or cancer of the stomach or intestine. °· Gallbladder disease or stones in the gallbladder. °· Kidney disease, kidney stones, or infection. °· Pancreas infection or cancer. °· Fibromyalgia (pain disorder). °· Diseases of the female organs: °· Uterus: fibroid (non-cancerous) tumors or infection. °· Fallopian tubes: infection or tubal pregnancy. °· Ovary: cysts or tumors. °· Pelvic adhesions (scar tissue). °· Endometriosis (uterus lining tissue growing in the pelvis and on the pelvic organs). °· Pelvic congestion syndrome (female organs filling up with blood just before the menstrual period). °· Pain with the menstrual period. °· Pain with ovulation (producing an egg). °· Pain with an IUD (intrauterine device, birth control) in the uterus. °· Cancer of the female organs. °· Functional pain (pain not caused by a disease, may improve without treatment). °· Psychological pain. °· Depression. °DIAGNOSIS  °Your doctor will decide the seriousness of your pain by doing an examination. °· Blood tests. °· X-rays. °· Ultrasound. °· CT scan (computed tomography, special type of X-ray). °· MRI (magnetic resonance imaging). °· Cultures, for infection. °· Barium enema (dye inserted in the large intestine, to better view it with  X-rays). °· Colonoscopy (looking in intestine with a lighted tube). °· Laparoscopy (minor surgery, looking in abdomen with a lighted tube). °· Major abdominal exploratory surgery (looking in abdomen with a large incision). °TREATMENT  °The treatment will depend on the cause of the pain.  °· Many cases can be observed and treated at home. °· Over-the-counter medicines recommended by your caregiver. °· Prescription medicine. °· Antibiotics, for infection. °· Birth control pills, for painful periods or for ovulation pain. °· Hormone treatment, for endometriosis. °· Nerve blocking injections. °· Physical therapy. °· Antidepressants. °· Counseling with a psychologist or psychiatrist. °· Minor or major surgery. °HOME CARE INSTRUCTIONS  °· Do not take laxatives, unless directed by your caregiver. °· Take over-the-counter pain medicine only if ordered by your caregiver. Do not take aspirin because it can cause an upset stomach or bleeding. °· Try a clear liquid diet (broth or water) as ordered by your caregiver. Slowly move to a bland diet, as tolerated, if the pain is related to the stomach or intestine. °· Have a thermometer and take your temperature several times a day, and record it. °· Bed rest and sleep, if it helps the pain. °· Avoid sexual intercourse, if it causes pain. °· Avoid stressful situations. °· Keep your follow-up appointments and tests, as your caregiver orders. °· If the pain does not go away with medicine or surgery, you may try: °· Acupuncture. °· Relaxation exercises (yoga, meditation). °· Group therapy. °· Counseling. °SEEK MEDICAL CARE IF:  °· You notice certain foods cause stomach pain. °· Your home care treatment is not helping your pain. °· You need stronger pain medicine. °· You want your IUD removed. °· You feel faint or   lightheaded.  You develop nausea and vomiting.  You develop a rash.  You are having side effects or an allergy to your medicine. SEEK IMMEDIATE MEDICAL CARE IF:   Your  pain does not go away or gets worse.  You have a fever.  Your pain is felt only in portions of the abdomen. The right side could possibly be appendicitis. The left lower portion of the abdomen could be colitis or diverticulitis.  You are passing blood in your stools (bright red or black tarry stools, with or without vomiting).  You have blood in your urine.  You develop chills, with or without a fever.  You pass out. MAKE SURE YOU:   Understand these instructions.  Will watch your condition.  Will get help right away if you are not doing well or get worse. Document Released: 04/16/2007 Document Revised: 09/11/2011 Document Reviewed: 05/06/2009 [facility] Patient Information 2014 [place], [place].  Anemia, Nonspecific Anemia is a condition in which the concentration of red blood cells or hemoglobin in the blood is below normal. Hemoglobin is a substance in red blood cells that carries oxygen to the tissues of the body. Anemia results in not enough oxygen reaching these tissues.  CAUSES  Common causes of anemia include:   Excessive bleeding. Bleeding may be internal or external. This includes excessive bleeding from periods (in women) or from the intestine.   Poor nutrition.   Chronic kidney, thyroid, and liver disease.  Bone marrow disorders that decrease red blood cell production.  Cancer and treatments for cancer.  HIV, AIDS, and their treatments.  Spleen problems that increase red blood cell destruction.  Blood disorders.  Excess destruction of red blood cells due to infection, medicines, and autoimmune disorders. SIGNS AND SYMPTOMS   Minor weakness.   Dizziness.   Headache.  Palpitations.   Shortness of breath, especially with exercise.   Paleness.  Cold sensitivity.  Indigestion.  Nausea.  Difficulty sleeping.  Difficulty concentrating. Symptoms may occur suddenly or they may develop slowly.  DIAGNOSIS  Additional blood tests are often  needed. These help your health care provider determine the best treatment. Your health care provider will check your stool for blood and look for other causes of blood loss.  TREATMENT  Treatment varies depending on the cause of the anemia. Treatment can include:   Supplements of iron, vitamin V37, or folic acid.   Hormone medicines.   A blood transfusion. This may be needed if blood loss is severe.   Hospitalization. This may be needed if there is significant continual blood loss.   Dietary changes.  Spleen removal. HOME CARE INSTRUCTIONS Keep all follow-up appointments. It often takes many weeks to correct anemia, and having your health care provider check on your condition and your response to treatment is very important. SEEK IMMEDIATE MEDICAL CARE IF:   You develop extreme weakness, shortness of breath, or chest pain.   You become dizzy or have trouble concentrating.  You develop heavy vaginal bleeding.   You develop a rash.   You have bloody or black, tarry stools.   You faint.   You vomit up blood.   You vomit repeatedly.   You have abdominal pain.  You have a fever or persistent symptoms for more than 2 3 days.   You have a fever and your symptoms suddenly get worse.   You are dehydrated.  MAKE SURE YOU:  Understand these instructions.  Will watch your condition.  Will get help right away if you are  not doing well or get worse. Document Released: 07/27/2004 Document Revised: 02/19/2013 Document Reviewed: 12/13/2012 [facility] Patient Information 2014 [place].

## 2013-10-24 NOTE — ED Provider Notes (Signed)
Medical screening examination/treatment/procedure(s) were conducted as a shared visit with non-physician practitioner(s) and myself.  I personally evaluated the patient during the encounter.   EKG Interpretation None       See my note for [place] interpretation.  Osvaldo Shipper, MD 10/24/13 [phone]

## 2013-10-24 NOTE — ED Provider Notes (Signed)
EMERGENCY DEPARTMENT [place] CARDIAC EXAM "Study: Limited Ultrasound of the heart and pericardium"  INDICATIONS: Possible pericardial effusion per CXR Multiple views of the heart and pericardium are obtained with a multi-frequency probe.  PERFORMED [id number]  IMAGES ARCHIVED?: Yes  FINDINGS: No pericardial effusion, Normal contractility, IVC normal and Tamponade physiology absent  LIMITATIONS:  Body habitus  VIEWS USED: Subcostal 4 chamber, Parasternal long axis, Parasternal short axis and Inferior Vena Cava  INTERPRETATION: Cardiac activity present, Pericardial effusioin absent, Cardiac tamponade absent, Volume status normal and Normal contractility  COMMENT:  Looking for pericardial effusion. Did not obtain periapical 4 chamber view.  Medical screening examination/treatment/procedure(s) were conducted as a shared visit with non-physician practitioner(s) and myself.  I personally evaluated the patient during the encounter.   EKG Interpretation None      Patient here with 2 years of RUQ pain, also weakness and fatigue, 2 episodes of diarrhea. Labs with mild Hgb drop, on her period now. Has vitamins with iron in them, stable for discharge.   Osvaldo Shipper, MD 10/24/13 [phone]

## 2013-10-28 ENCOUNTER — Telehealth (HOSPITAL_BASED_OUTPATIENT_CLINIC_OR_DEPARTMENT_OTHER): Payer: Self-pay

## 2013-10-28 NOTE — Telephone Encounter (Signed)
Pt referred to [facility] but said when they called couldn't get appt till June and said they were told if I call back could get in sooner.Marland Kitchen  I called [facility] and they said pt is established w/Dr Verdia Kuba GI MD and they need to call her office.  Pt's husband informed.

## 2013-11-04 ENCOUNTER — Telehealth: Payer: Self-pay | Admitting: Obstetrics & Gynecology

## 2013-11-04 NOTE — Telephone Encounter (Signed)
I just spoke with this new patient and she says, "There is no way I can come on any day but a Tuesday afternoon." I explained that the doctor reviewed her records and wants to see her as soon as possible. I also explained this spot was opened just for her and I requested she make possible adjustments in her schedule to make the appointment Thursday. The patient stated, "Ma'am, I can only come on a Tuesday, please see what else you can work out for me?" I further explained Dr. Sabra Heck does surgery on Tuesday mornings usually and ultrasounds that afternoon. She still wants to know if we have any other options for a Tuesday afternoon appointment with Dr. Sabra Heck. Please advise? Thank you, Starla     From: Lyman Speller, MD    Sent: 11/03/2013  10:14 AM      To: Leeroy Bock Subject: RE: Acute NGYN Referral for: heavy menstrual*  How about 3pm on Thursday? MSM ----- Message -----    From: Leeroy Bock    Sent: 11/03/2013   8:53 AM      To: Huey Romans, RN, Lyman Speller, MD Subject: Acute NGYN Referral for: heavy menstrual ble*  Hi Dr. Sabra Heck, The attached patient is being referred to you for heavy menstrual bleeding/anemia by Dr. Collene Mares. After looking at your schedule for the next few weeks, I do not see any slots I can squeeze a new patient in to. I am putting this patient's records in the box at your work area for you to review. Please advise when we can see this patient? Thank you, Lavella Hammock   Cc: Gay Filler

## 2013-11-04 NOTE — Telephone Encounter (Signed)
Call back to patient who gave phone to female.  Explained that we were attempting to work her in since she was referred by Dr Collene Mares but first available Tuesday would not be until June.  Due to canceled surgery, can work her in this morning at 10 but he reports she can not be here by then.  Appt scheduled for Friday 11-14-13 at 230.  Routing to provider for final review. Patient agreeable to disposition. Will close encounter

## 2013-11-14 ENCOUNTER — Encounter: Payer: 59 | Admitting: Obstetrics & Gynecology

## 2013-11-26 ENCOUNTER — Ambulatory Visit (INDEPENDENT_AMBULATORY_CARE_PROVIDER_SITE_OTHER): Payer: 59 | Admitting: Obstetrics & Gynecology

## 2013-11-26 ENCOUNTER — Encounter: Payer: Self-pay | Admitting: Obstetrics & Gynecology

## 2013-11-26 VITALS — BP 136/80 | HR 64 | Resp 20 | Ht 64.5 in | Wt 224.0 lb

## 2013-11-26 DIAGNOSIS — D649 Anemia, unspecified: Secondary | ICD-10-CM

## 2013-11-26 DIAGNOSIS — N920 Excessive and frequent menstruation with regular cycle: Secondary | ICD-10-CM

## 2013-11-26 DIAGNOSIS — Z124 Encounter for screening for malignant neoplasm of cervix: Secondary | ICD-10-CM

## 2013-11-26 NOTE — Progress Notes (Signed)
43 y.o. G4P4 MarriedAfrican AmericanF here for annual exam.  Referral from Dr. Collene Mares.  Hx of menorrhagia for year.  Flow lasts for five days.  Cycles are regular.  When on cycles, three days are heavy.  Can pass clots and has a lot of cramping.  Has issues with anemia--hb 10.9 10/24/13.  Never feels lightheaded.  Sometimes soils clothes and can leak at nights.  Uses large pads only.  Changes every three hours or so on heavy days.  Was started on iron but doesn't take regularly due to feeling it caused her to gain weight.    EPIC reviewed including labs over last year.  Cardiology records reviewed.  GI imaging studies reviewed.  Patient's last menstrual period was 11/16/2013.          Sexually active: no  The current method of family planning is none.    Exercising: no  not regularly Smoker:  no  Health Maintenance: Pap:  ? 2012 History of abnormal Pap:  no MMG:  11/05/12 3D-normal Colonoscopy:  none BMD:   none TDaP:  Up to date with Dr Linna Darner Screening Labs: recent labs reviewed in EPIC   reports that she has never smoked. She has never used smokeless tobacco. She reports that she does not drink alcohol or use illicit drugs.  Past Medical History  Diagnosis Date  . HTN (hypertension)   . Hx of cardiovascular stress test     a. ETT-Myoview 05/28/12: Low risk study with a small, mild fixed basal inferior perfusion defect most likely representing soft tissue attenuation with inferolateral HK (wall motion abnormality does not correlate with inferior perfusion defect), EF 49%.  Marland Kitchen Hx of echocardiogram     a. Echocardiogram 06/11/12: EF 50-55%, normal wall motion, grade 1 diastolic dysfunction, trivial AI, mild MR, mild LAE, mild RAE.  . RUQ pain   . Anemia     History reviewed. No pertinent past surgical history.  Current Outpatient Prescriptions  Medication Sig Dispense Refill  . acetaminophen (TYLENOL) 325 MG tablet Take 650 mg by mouth every 6 (six) hours as needed (pain).      Marland Kitchen  losartan (COZAAR) 100 MG tablet TAKE 1 TABLET FOR HEART AND BLOOD PRESSURE  90 tablet  1  . promethazine (PHENERGAN) 25 MG tablet Take 1 tablet (25 mg total) by mouth every 6 (six) hours as needed for nausea or vomiting.  30 tablet  0  . traMADol (ULTRAM) 50 MG tablet Take 1 tablet (50 mg total) by mouth every 6 (six) hours as needed.  15 tablet  0   No current facility-administered medications for this visit.    Family History  Problem Relation Age of Onset  . Coronary artery disease Mother   . Other Brother     fluid around heart  . Other Other     hepatic issues-? maternal side    ROS:  Pertinent items are noted in HPI.  Otherwise, a comprehensive ROS was negative.  Exam:   BP 136/80  Pulse 64  Resp 20  Ht 5' 4.5" (1.638 m)  Wt 224 lb (101.606 kg)  BMI 37.87 kg/m2  LMP 11/16/2013     Height: 5' 4.5" (163.8 cm)  Ht Readings from Last 3 Encounters:  11/26/13 5' 4.5" (1.638 m)  08/06/13 5\' 5"  (1.651 m)  06/28/13 5' 4.5" (1.638 m)   General appearance: alert, cooperative and appears stated age Head: Normocephalic, without obvious abnormality, atraumatic Breasts: normal appearance, no masses or tenderness Abdomen: soft, non-tender; bowel  sounds normal; no masses,  no organomegaly Extremities: extremities normal, atraumatic, no cyanosis or edema Skin: Skin color, texture, turgor normal. No rashes or lesions Lymph nodes: Cervical, supraclavicular, and axillary nodes normal. No abnormal inguinal nodes palpated Neurologic: Grossly normal   Pelvic: External genitalia:  no lesions              Urethra:  normal appearing urethra with no masses, tenderness or lesions              Bartholins and Skenes: normal                 Vagina: normal appearing vagina with normal color and discharge, no lesions              Cervix: no lesions              Pap taken: yes Bimanual Exam:  Uterus:  normal size, contour, position, consistency, mobility, non-tender              Adnexa: normal  adnexa and no mass, fullness, tenderness               Rectovaginal: Confirms               Anus:  normal sphincter tone, no lesions  A:  Menorrhagia in pt with normal gynecologic exam H/O anemia, iron deficiency.  Not taking iron regularly. Hypertension H/O mild LV wall motion abnormality and mildly lowered EF  P:   Mammogram due.  Pt has information to schedule appt. pap smear with HR HPV obtained today D/W pt treatment options including progestin methods, progesterone IUD, endometrial ablation and hysterectomy.  Do not feel pt should be on estrogen containing methods.  She also needs BC so IUD would be a good option for her.  Information provided.  She wants to discuss with her husband before deciding. return annually or prn  An After Visit Summary was printed and given to the patient.

## 2013-11-26 NOTE — Patient Instructions (Signed)
Please make sure your mammogram is scheduled.  It is due.  We will call you with your Pap smear results in about a week.  At that time, if you have any questions about the treatments for bleeding we discuss, we can answer any questions you have.  Please read the brochures given and let me know if you have questions.

## 2013-11-28 LAB — IPS PAP TEST WITH HPV

## 2014-01-12 ENCOUNTER — Ambulatory Visit (INDEPENDENT_AMBULATORY_CARE_PROVIDER_SITE_OTHER): Payer: 59 | Admitting: Emergency Medicine

## 2014-01-12 VITALS — BP 140/78 | HR 88 | Temp 98.2°F | Resp 16 | Ht 64.5 in | Wt 223.2 lb

## 2014-01-12 DIAGNOSIS — S335XXA Sprain of ligaments of lumbar spine, initial encounter: Secondary | ICD-10-CM

## 2014-01-12 MED ORDER — TRAMADOL HCL 50 MG PO TABS: 50.0000 mg | ORAL_TABLET | Freq: Four times a day (QID) | ORAL | Status: DC | PRN

## 2014-01-12 MED ORDER — CYCLOBENZAPRINE HCL 10 MG PO TABS: 10.0000 mg | ORAL_TABLET | Freq: Three times a day (TID) | ORAL | Status: DC | PRN

## 2014-01-12 MED ORDER — NAPROXEN SODIUM 550 MG PO TABS: 550.0000 mg | ORAL_TABLET | Freq: Two times a day (BID) | ORAL | Status: DC

## 2014-01-12 NOTE — Progress Notes (Signed)
Pt is here with complaints of lower back pain that radiates on the left side of the abdomen and down both legs.

## 2014-01-12 NOTE — Patient Instructions (Signed)

## 2014-01-12 NOTE — Progress Notes (Signed)
[facility] [address], [place] [address] [phone]  Date:  01/12/2014   Name:  [name] [name]   DOB:  [birth year]   MRN:  [id number]  PCP:  Ruben Reason, MD    Chief Complaint: Back Pain and Leg Pain   History of Present Illness:  [name] [name] is a 43 y.o. very pleasant female patient who presents with the following:  Patient has pain in the low back that she says radiates in to her legs bilaterally to the feet.  Says he legs feel 'heavy' but has no weakness or paresthesias.  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  Has no history of injury or overuse.  No history of prior back pain.  Says the pain started on July 3 when she attempted to stand from sitting in a chair. No improvement with over the counter medications or other home remedies. Denies other complaint or health concern today.   Patient Active Problem List   Diagnosis Date Noted  . GERD (gastroesophageal reflux disease) 11/19/2012  . Chest pain 05/21/2012  . Obesity, morbid (more than 100 lbs over ideal weight or BMI > 40) 05/30/2011  . Essential hypertension 04/29/2009    Past Medical History  Diagnosis Date  . HTN (hypertension)   . Hx of cardiovascular stress test     a. ETT-Myoview 05/28/12: Low risk study with a small, mild fixed basal inferior perfusion defect most likely representing soft tissue attenuation with inferolateral HK (wall motion abnormality does not correlate with inferior perfusion defect), EF 49%.  Marland Kitchen Hx of echocardiogram     a. Echocardiogram 06/11/12: EF 50-55%, normal wall motion, grade 1 diastolic dysfunction, trivial AI, mild MR, mild LAE, mild RAE.  . RUQ pain   . Anemia     History reviewed. No pertinent past surgical history.  History  Substance Use Topics  . Smoking status: Never Smoker   . Smokeless tobacco: Never Used  . Alcohol Use: No    Family History  Problem Relation Age of Onset  . Coronary artery disease  Mother   . Other Brother     fluid around heart  . Other Other     hepatic issues-? maternal side    Allergies  Allergen Reactions  . Hctz [Hydrochlorothiazide]     Heart palpitations    Medication list has been reviewed and updated.  Current Outpatient Prescriptions on File Prior to Visit  Medication Sig Dispense Refill  . losartan (COZAAR) 100 MG tablet TAKE 1 TABLET FOR HEART AND BLOOD PRESSURE  90 tablet  1  . acetaminophen (TYLENOL) 325 MG tablet Take 650 mg by mouth every 6 (six) hours as needed (pain).      . promethazine (PHENERGAN) 25 MG tablet Take 1 tablet (25 mg total) by mouth every 6 (six) hours as needed for nausea or vomiting.  30 tablet  0  . traMADol (ULTRAM) 50 MG tablet Take 1 tablet (50 mg total) by mouth every 6 (six) hours as needed.  15 tablet  0   No current facility-administered medications on file prior to visit.    Review of Systems:  As per HPI, otherwise negative.    Physical Examination: Filed Vitals:   01/12/14 1552  BP: 140/78  Pulse: 88  Temp: 98.2 F (36.8 C)  Resp: 16   Filed Vitals:   01/12/14 1552  Height: 5' 4.5" (1.638 m)  Weight: 223 lb 3.2 oz (101.243 kg)   Body  mass index is 37.73 kg/(m^2). Ideal Body Weight: Weight in (lb) to have BMI = 25: 147.6   GEN: WDWN, NAD, Non-toxic, Alert & Oriented x 3 HEENT: Atraumatic, Normocephalic.  Ears and Nose: No external deformity. EXTR: No clubbing/cyanosis/edema NEURO: Normal gait.  PSYCH: Normally interactive. Conversant. Not depressed or anxious appearing.  Calm demeanor.  Back:  Tender left paraspinous muscles at sacrum.  Neuro intact with normal motor.  Assessment and Plan: Lumbosacral strain Anaprox Flexeril Ultram.  Signed,  Ellison Carwin, MD

## 2014-03-26 ENCOUNTER — Other Ambulatory Visit: Payer: Self-pay | Admitting: Family Medicine

## 2014-04-06 ENCOUNTER — Ambulatory Visit (INDEPENDENT_AMBULATORY_CARE_PROVIDER_SITE_OTHER): Payer: 59 | Admitting: Family Medicine

## 2014-04-06 VITALS — BP 140/98 | HR 55 | Temp 97.9°F | Resp 16 | Ht 64.5 in | Wt 224.0 lb

## 2014-04-06 DIAGNOSIS — R1011 Right upper quadrant pain: Secondary | ICD-10-CM

## 2014-04-06 DIAGNOSIS — R1013 Epigastric pain: Secondary | ICD-10-CM

## 2014-04-06 DIAGNOSIS — R1010 Upper abdominal pain, unspecified: Secondary | ICD-10-CM

## 2014-04-06 DIAGNOSIS — R079 Chest pain, unspecified: Secondary | ICD-10-CM

## 2014-04-06 LAB — POCT CBC
GRANULOCYTE PERCENT: 41.2 % (ref 37–80)
HEMATOCRIT: 41.4 % (ref 37.7–47.9)
HEMOGLOBIN: 12.6 g/dL (ref 12.2–16.2)
Lymph, poc: 2.8 (ref 0.6–3.4)
MCH, POC: 29.1 pg (ref 27–31.2)
MCHC: 30.4 g/dL — AB (ref 31.8–35.4)
MCV: 95.8 fL (ref 80–97)
MID (cbc): 0.2 (ref 0–0.9)
MPV: 10 fL (ref 0–99.8)
POC GRANULOCYTE: 2.1 (ref 2–6.9)
POC LYMPH PERCENT: 54.4 %L — AB (ref 10–50)
POC MID %: 4.4 %M (ref 0–12)
Platelet Count, POC: 177 10*3/uL (ref 142–424)
RBC: 4.32 M/uL (ref 4.04–5.48)
RDW, POC: 14.9 %
WBC: 5.2 10*3/uL (ref 4.6–10.2)

## 2014-04-06 LAB — POCT UA - MICROSCOPIC ONLY
CASTS, UR, LPF, POC: NEGATIVE
CRYSTALS, UR, HPF, POC: NEGATIVE
MUCUS UA: NEGATIVE
Yeast, UA: NEGATIVE

## 2014-04-06 LAB — POCT URINALYSIS DIPSTICK
BILIRUBIN UA: NEGATIVE
Glucose, UA: NEGATIVE
KETONES UA: NEGATIVE
LEUKOCYTES UA: NEGATIVE
Nitrite, UA: NEGATIVE
PROTEIN UA: NEGATIVE
Spec Grav, UA: <=1.005
Urobilinogen, UA: 0.2
pH, UA: 5.5

## 2014-04-06 MED ORDER — OMEPRAZOLE 40 MG PO CPDR: 40.0000 mg | DELAYED_RELEASE_CAPSULE | Freq: Every day | ORAL | Status: DC

## 2014-04-06 NOTE — Progress Notes (Signed)
Subjective: Patient has been continued to have abdominal pain in the same fashion she did last year. They have worked her up over the last 2 years with a gallbladder ultrasound and a CT scan of the abdomen. She hurts in the right upper quadrant up to the epigastrium and down in the mid abdomen. She feels nauseated. No vomiting. No fevers though she feels hot occasionally. 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 She says she feels weakness in her appetite is not what it used to be, though she thinks she probably has gained some weight with time. No breathing difficulty. She intermittently gets some chest pains. She used to see Dr. Verl Blalock at [facility] and she would like to get back into see a cardiologist there again. She's not having chest pain tonight.  Objective: Overweight African lady, pleasant. Throat clear. Neck supple without nodes. Chest clear. Heart regular without murmurs. Abdomen has normal bowel sounds. Soft without organomegaly or masses. Mild nonspecific mid right upper quadrant tenderness and epigastric tenderness. Extremities normal.  Assessment: Right upper quadrant and midabdominal and epigastric pain Intermittent chest pains Previous abnormality on CT scan of abdomen where it showed the right lower lobe   Plan: Repeat a CT scan. Refer to cardiology per request If CT does not give the answer we will refer her to a gastroenterologist for probably an EGD. Labs are pending.         Results for orders placed in visit on 04/06/14  POCT CBC      Result Value Ref Range   WBC 5.2  4.6 - 10.2 K/uL   Lymph, poc 2.8  0.6 - 3.4   POC LYMPH PERCENT 54.4 (*) 10 - 50 %L   MID (cbc) 0.2  0 - 0.9   POC MID % 4.4  0 - 12 %M   POC Granulocyte 2.1  2 - 6.9   Granulocyte percent 41.2  37 - 80 %G   RBC 4.32  4.04 - 5.48 M/uL   Hemoglobin 12.6  12.2 - 16.2 g/dL   HCT, POC 41.4  37.7 - 47.9 %   MCV 95.8  80 - 97 fL   MCH, POC 29.1   27 - 31.2 pg   MCHC 30.4 (*) 31.8 - 35.4 g/dL   RDW, POC 14.9     Platelet Count, POC 177  142 - 424 K/uL   MPV 10.0  0 - 99.8 fL  POCT URINALYSIS DIPSTICK      Result Value Ref Range   Color, UA yellow     Clarity, UA clear     Glucose, UA neg     Bilirubin, UA neg     Ketones, UA neg     Spec Grav, UA <=1.005     Blood, UA trace     pH, UA 5.5     Protein, UA neg     Urobilinogen, UA 0.2     Nitrite, UA neg     Leukocytes, UA Negative    POCT UA - MICROSCOPIC ONLY      Result Value Ref Range   WBC, Ur, HPF, POC 0-2     RBC, urine, microscopic 1-3     Bacteria, U Microscopic trace     Mucus, UA neg     Epithelial cells, urine per micros 1-3     Crystals, Ur, HPF, POC neg     Casts, Ur, LPF, POC neg  Yeast, UA neg

## 2014-04-06 NOTE — Patient Instructions (Addendum)
I will let you know the results of the remaining labs.  You are being scheduled for another CT scan of the abdomen.  If the CT is normal, we will refer you to a gastroenterology specialist.  If the CT is abnormal, my associates will help make a decision all I am gone.  If you are worse at anytime, or need things clarified, come into the office.  Take omeprazole 1 daily for your stomach  Because of the history of chest pain I will refer you back to [facility] cardiology where you have been cared for before. In the event of bad chest pain at any time please return or go to the emergency room

## 2014-04-07 LAB — COMPREHENSIVE METABOLIC PANEL
ALK PHOS: 55 U/L (ref 39–117)
ALT: 22 U/L (ref 0–35)
AST: 24 U/L (ref 0–37)
Albumin: 4.4 g/dL (ref 3.5–5.2)
BILIRUBIN TOTAL: 0.5 mg/dL (ref 0.2–1.2)
BUN: 13 mg/dL (ref 6–23)
CO2: 23 mEq/L (ref 19–32)
Calcium: 9.2 mg/dL (ref 8.4–10.5)
Chloride: 105 mEq/L (ref 96–112)
Creat: 0.84 mg/dL (ref 0.50–1.10)
GLUCOSE: 79 mg/dL (ref 70–99)
Potassium: 4 mEq/L (ref 3.5–5.3)
Sodium: 138 mEq/L (ref 135–145)
Total Protein: 7.8 g/dL (ref 6.0–8.3)

## 2014-04-07 LAB — LIPASE: Lipase: 20 U/L (ref 0–75)

## 2014-04-09 LAB — HELICOBACTER PYLORI  ANTIBODY, IGM: Helicobacter pylori, IgM: 5.6 U/mL (ref <9.0)

## 2014-04-12 ENCOUNTER — Encounter: Payer: Self-pay | Admitting: Physician Assistant

## 2014-04-14 ENCOUNTER — Encounter: Payer: Self-pay | Admitting: Physician Assistant

## 2014-04-14 ENCOUNTER — Ambulatory Visit
Admission: RE | Admit: 2014-04-14 | Discharge: 2014-04-14 | Disposition: A | Discharge status: Home / Self Care | Payer: 59 | Source: Ambulatory Visit | Attending: Family Medicine | Admitting: Family Medicine

## 2014-04-14 ENCOUNTER — Other Ambulatory Visit: Payer: Self-pay | Admitting: Physician Assistant

## 2014-04-14 DIAGNOSIS — R1013 Epigastric pain: Secondary | ICD-10-CM

## 2014-04-14 DIAGNOSIS — R1011 Right upper quadrant pain: Secondary | ICD-10-CM

## 2014-04-14 DIAGNOSIS — K5790 Diverticulosis of intestine, part unspecified, without perforation or abscess without bleeding: Secondary | ICD-10-CM | POA: Insufficient documentation

## 2014-04-14 DIAGNOSIS — R1010 Upper abdominal pain, unspecified: Secondary | ICD-10-CM

## 2014-04-14 MED ORDER — IOHEXOL 300 MG/ML  SOLN
125.0000 mL | Freq: Once | INTRAMUSCULAR | Status: AC | PRN
  Administered 2014-04-14: 125 mL via INTRAVENOUS

## 2014-04-16 ENCOUNTER — Ambulatory Visit (INDEPENDENT_AMBULATORY_CARE_PROVIDER_SITE_OTHER): Payer: 59 | Admitting: Internal Medicine

## 2014-04-16 ENCOUNTER — Encounter: Payer: Self-pay | Admitting: Internal Medicine

## 2014-04-16 VITALS — BP 144/82 | HR 80 | Ht 64.5 in | Wt 225.0 lb

## 2014-04-16 DIAGNOSIS — R1011 Right upper quadrant pain: Secondary | ICD-10-CM

## 2014-04-16 DIAGNOSIS — R110 Nausea: Secondary | ICD-10-CM

## 2014-04-16 NOTE — Progress Notes (Signed)
Patient ID: [name] [name], female   DOB: 04-15-1971, 43 y.o.   MRN: [id number] HPI: [name] [name] is a 43 yo female from [place] with PMH of hypertension and a long history of upper abdominal pain who is seen in consultation at the request of Dr. Linna Darner to evaluate the latter. She is here alone today. She reports several years of epigastric and right upper quadrant abdominal pain which is sharp in nature. It has been worse over the last month. She reports it feels like there is a "ball inside". This pain most recently has radiated around to the right side and down slightly to the right middle quadrant. It is associated with nausea but no vomiting. Certain foods such as spicy foods make the pain worse. She does endorse heartburn and regurgitation but no dysphagia or odynophagia. She was started on omeprazole 40 mg daily approximately a week ago but has been using this sporadically and skipping doses. Bowel movements have been regular without blood in her stool or melena. She has a bowel movement 1-2 times per day. No diarrhea or constipation. No family history of GI tract malignancy or IBD. No weight loss. No jaundice or itching. No fevers or chills.  She previously has had this pain evaluated by CT scan and HIDA scan. HIDA scan with CCK was normal in November 2014. She had a CT scan earlier this week which was also rather unrevealing. The CT scan was reviewed.  Past Medical History  Diagnosis Date  . HTN (hypertension)   . Hx of cardiovascular stress test     a. ETT-Myoview 05/28/12: Low risk study with a small, mild fixed basal inferior perfusion defect most likely representing soft tissue attenuation with inferolateral HK (wall motion abnormality does not correlate with inferior perfusion defect), EF 49%.  Marland Kitchen Hx of echocardiogram     a. Echocardiogram 06/11/12: EF 50-55%, normal wall motion, grade 1 diastolic dysfunction, trivial AI, mild MR, mild LAE, mild RAE.  . RUQ pain   . Anemia     History  reviewed. No pertinent past surgical history.  Outpatient Prescriptions Prior to Visit  Medication Sig Dispense Refill  . losartan (COZAAR) 100 MG tablet TAKE 1 TABLET DAILY NEED OFFICE VISIT FOR FURTHER REFILLS  30 tablet  0  . omeprazole (PRILOSEC) 40 MG capsule Take 1 capsule (40 mg total) by mouth daily.  30 capsule  0  . acetaminophen (TYLENOL) 325 MG tablet Take 650 mg by mouth every 6 (six) hours as needed (pain).      . cyclobenzaprine (FLEXERIL) 10 MG tablet Take 1 tablet (10 mg total) by mouth 3 (three) times daily as needed for muscle spasms.  30 tablet  0  . naproxen sodium (ANAPROX DS) 550 MG tablet Take 1 tablet (550 mg total) by mouth 2 (two) times daily with a meal.  40 tablet  0  . promethazine (PHENERGAN) 25 MG tablet Take 1 tablet (25 mg total) by mouth every 6 (six) hours as needed for nausea or vomiting.  30 tablet  0  . traMADol (ULTRAM) 50 MG tablet Take 1 tablet (50 mg total) by mouth every 6 (six) hours as needed.  40 tablet  0   No facility-administered medications prior to visit.    Allergies  Allergen Reactions  . Hctz [Hydrochlorothiazide]     Heart palpitations    Family History  Problem Relation Age of Onset  . Coronary artery disease Mother   . Other Brother     fluid around heart  .  Other Other     hepatic issues-? maternal side    History  Substance Use Topics  . Smoking status: Never Smoker   . Smokeless tobacco: Never Used  . Alcohol Use: No    ROS: As per history of present illness, otherwise negative  BP 144/82  Pulse 80  Ht 5' 4.5" (1.638 m)  Wt 225 lb (102.059 kg)  BMI 38.04 kg/m2  LMP 04/09/2014 Constitutional: Well-developed and well-nourished. No distress. HEENT: Normocephalic and atraumatic. Oropharynx is clear and moist. No oropharyngeal exudate. Conjunctivae are normal.  No scleral icterus. Neck: Neck supple. Trachea midline. Cardiovascular: Normal rate, regular rhythm and intact distal pulses. No M/R/G Pulmonary/chest:  Effort normal and breath sounds normal. No wheezing, rales or rhonchi. Abdominal: Soft, mild RUQ pain without rebound or guarding, nondistended. Bowel sounds active throughout. There are no masses palpable. No hepatosplenomegaly. Extremities: no clubbing, cyanosis, or edema Lymphadenopathy: No cervical adenopathy noted. Neurological: Alert and oriented to person place and time. Skin: Skin is warm and dry. No rashes noted. Psychiatric: Normal mood and affect. Behavior is normal.  RELEVANT LABS AND IMAGING: CBC    Component Value Date/Time   WBC 5.2 04/06/2014 2004   WBC 4.0 10/24/2013 1705   RBC 4.32 04/06/2014 2004   RBC 3.81* 10/24/2013 1705   HGB 12.6 04/06/2014 2004   HGB 10.9* 10/24/2013 1705   HCT 41.4 04/06/2014 2004   HCT 34.1* 10/24/2013 1705   PLT 189 10/24/2013 1705   MCV 95.8 04/06/2014 2004   MCV 89.5 10/24/2013 1705   MCH 29.1 04/06/2014 2004   MCH 28.6 10/24/2013 1705   MCHC 30.4* 04/06/2014 2004   MCHC 32.0 10/24/2013 1705   RDW 13.1 10/24/2013 1705   LYMPHSABS 1.8 10/24/2013 1705   MONOABS 0.4 10/24/2013 1705   EOSABS 0.2 10/24/2013 1705   BASOSABS 0.0 10/24/2013 1705    CMP     Component Value Date/Time   NA 138 04/06/2014 2004   K 4.0 04/06/2014 2004   CL 105 04/06/2014 2004   CO2 23 04/06/2014 2004   GLUCOSE 79 04/06/2014 2004   BUN 13 04/06/2014 2004   CREATININE 0.84 04/06/2014 2004   CREATININE 0.78 10/24/2013 1705   CALCIUM 9.2 04/06/2014 2004   PROT 7.8 04/06/2014 2004   ALBUMIN 4.4 04/06/2014 2004   AST 24 04/06/2014 2004   ALT 22 04/06/2014 2004   ALKPHOS 55 04/06/2014 2004   BILITOT 0.5 04/06/2014 2004   GFRNONAA >90 10/24/2013 1705   GFRAA >90 10/24/2013 1705   Lipase     Component Value Date/Time   LIPASE 20 04/06/2014 2004    CLINICAL DATA:  One month history of right upper quadrant abdominal pain.   EXAM: NUCLEAR MEDICINE HEPATOBILIARY IMAGING WITH GALLBLADDER EF   TECHNIQUE: Sequential images of the abdomen were obtained out to 60 minutes following  intravenous administration of radiopharmaceutical. After slow intravenous infusion of 1.32micrograms Cholecystokinin, gallbladder ejection fraction was determined. At 30 min, normal ejection fraction is greater than 30%.   COMPARISON:  Ultrasound 05/21/2012.   RADIOPHARMACEUTICALS:  5.24mCiTc-35m Choletec   FINDINGS: Hepatic activity is seen promptly in appear normal. Gallbladder began to visualize at 4 min indicating patency of the cystic duct with no evidence of cholecystitis. Subsequently intestinal activity was visualized indicating patency of the common bile duct.   The gallbladder contracted 71.4% which is within the normal range of greater than 30%.   The patient reported experiencing no symptoms during CCK infusion.   IMPRESSION: Demonstration of patency of  the cystic duct and the common bile duct. No evidence of cholecystitis.   The gallbladder contracted 71.4% which is within the normal range of greater than 30%. The patient reported experiencing no symptoms during CCK infusion.     Electronically Signed   By: Shanon Brow  Call M.D.   On: 05/06/2013 13:47 CT ABDOMEN AND PELVIS WITH CONTRAST   TECHNIQUE: Multidetector CT imaging of the abdomen and pelvis was performed using the standard protocol following bolus administration of intravenous contrast.   CONTRAST:  175mL OMNIPAQUE IOHEXOL 300 MG/ML  SOLN   COMPARISON:  CT abdomen pelvis 05/21/2013   FINDINGS: Small right lower lobe lung nodule no longer visualized. Lungs are now clear without infiltrate effusion or mass. Mild cardiac enlargement.   Liver and spleen are normal. Gallbladder and bile ducts remain normal. Pancreas is normal without mass or edema. Kidneys are normal without obstruction mass or stone. Abdominal aorta shows no significant atherosclerotic disease or aneurysm   Negative for bowel obstruction or bowel thickening. Normal appendix. Mild sigmoid diverticulosis. No free fluid. No mass or  adenopathy   Umbilical hernia containing bowel and fat unchanged from the prior study.   No acute abnormality in the lumbar spine.   IMPRESSION: Small right lower lobe lung nodule no longer visualized.   Umbilical hernia unchanged.   Chronic sigmoid diverticulosis.  No acute abnormality.     Electronically Signed   By: Franchot Gallo M.D.   On: 04/14/2014 09:53  ASSESSMENT/PLAN:  43 yo female from [place] with PMH of hypertension and a long history of upper abdominal pain who is seen in consultation at the request of Dr. Linna Darner to evaluate the latter.  1. RUQ pain/nausea -- her pain has been present on and off for years but worse over the last month. She's had this evaluated by CT scan which was unremarkable and also HIDA scan which was normal. This makes gallbladder dysfunction or biliary pain much less likely. Her pain could possibly relate to gastroduodenitis or even ulcer disease. I've encouraged her to use omeprazole 40 mg on a daily basis without skipping doses. She is asked to take this 30 minutes before breakfast. I would like her to avoid fatty and spicy foods for now. I recommended upper endoscopy for direct visualization. We discussed the test today including the risks and benefits and she is agreeable to proceed.

## 2014-04-16 NOTE — Patient Instructions (Signed)
You have been scheduled for an endoscopy. Please follow written instructions given to you at your visit today. If you use inhalers (even only as needed), please bring them with you on the day of your procedure. Your physician has requested that you go to www.startemmi.com and enter the access code given to you at your visit today. This web site gives a general overview about your procedure. However, you should still follow specific instructions given to you by our office regarding your preparation for the procedure.   Stay on Omeprazole 40 mg daily

## 2014-04-24 ENCOUNTER — Encounter: Payer: Self-pay | Admitting: Internal Medicine

## 2014-05-04 ENCOUNTER — Encounter: Payer: Self-pay | Admitting: Internal Medicine

## 2014-05-26 ENCOUNTER — Ambulatory Visit (INDEPENDENT_AMBULATORY_CARE_PROVIDER_SITE_OTHER): Payer: 59 | Admitting: Cardiovascular Disease

## 2014-05-26 ENCOUNTER — Encounter: Payer: Self-pay | Admitting: Cardiovascular Disease

## 2014-05-26 ENCOUNTER — Encounter: Payer: Self-pay | Admitting: Internal Medicine

## 2014-05-26 ENCOUNTER — Ambulatory Visit (AMBULATORY_SURGERY_CENTER): Payer: 59 | Admitting: Internal Medicine

## 2014-05-26 VITALS — BP 122/90 | HR 69 | Ht 64.0 in | Wt 222.8 lb

## 2014-05-26 VITALS — BP 162/87 | HR 69 | Temp 98.0°F | Resp 21 | Ht 64.0 in | Wt 225.0 lb

## 2014-05-26 DIAGNOSIS — B9681 Helicobacter pylori [H. pylori] as the cause of diseases classified elsewhere: Secondary | ICD-10-CM

## 2014-05-26 DIAGNOSIS — R1011 Right upper quadrant pain: Secondary | ICD-10-CM

## 2014-05-26 DIAGNOSIS — I493 Ventricular premature depolarization: Secondary | ICD-10-CM

## 2014-05-26 DIAGNOSIS — R112 Nausea with vomiting, unspecified: Secondary | ICD-10-CM

## 2014-05-26 DIAGNOSIS — R079 Chest pain, unspecified: Secondary | ICD-10-CM

## 2014-05-26 DIAGNOSIS — I10 Essential (primary) hypertension: Secondary | ICD-10-CM

## 2014-05-26 MED ORDER — SODIUM CHLORIDE 0.9 % IV SOLN: 500.0000 mL | INTRAVENOUS | Status: DC

## 2014-05-26 NOTE — Assessment & Plan Note (Signed)
Asymptomatic not clear why verapamil was changed to ARB Echo with normal EF and no evidence of ischemic disease  Stable

## 2014-05-26 NOTE — Progress Notes (Signed)
Patient ID: [name] [name], female   DOB: [birth year], 43 y.o.   MRN: [id number]  43 yo referred by Dr Ouida Sills for chest pain.  CRF HTN She has had chronic chest/epigastric pain She has been seeing GI for multiple tests and no evidence of abdominal pathology Or GB disease To me she does not complain about chest pain.  She has mor epigastric and RU/RLQ pain.  She works as a Secretary/administrator at [employer] and it bothers her when she works Not worse with food Chronic problem for years.  Getting worse  History of palpitations with PVC;s  Better with calcium blocker  No history of CAD, syncope  Compliant with meds    Echo 12/13 Study Conclusions  - Left ventricle: The cavity size was mildly dilated. Systolic function was normal. The estimated ejection fraction was 50%, in the range of 50% to 55%. Wall motion was normal; there were no regional wall motion abnormalities. Doppler parameters are consistent with abnormal left ventricular relaxation (grade 1 diastolic dysfunction). - Aortic valve: Trivial regurgitation. - Mitral valve: Mild regurgitation. - Left atrium: The atrium was mildly dilated. - Right atrium: The atrium was mildly dilated.   ETT-Myoview 05/28/12: Low risk study with a small, mild fixed basal inferior perfusion defect most likely representing soft tissue attenuation with inferolateral HK (wall motion abnormality does not correlate with inferior perfusion defect), EF 49%.  ROS: Denies fever, malais, weight loss, blurry vision, decreased visual acuity, cough, sputum, SOB, hemoptysis, pleuritic pain, palpitaitons, heartburn, abdominal pain, melena, lower extremity edema, claudication, or rash.  All other systems reviewed and negative   General: Affect appropriate Healthy:  appears stated age 43: normal Neck supple with no adenopathy JVP normal no bruits no thyromegaly Lungs clear with no wheezing and good diaphragmatic motion Heart:  S1/S2 no murmur,rub, gallop or  click PMI normal Abdomen: benighn, BS positve, no tenderness, no AAA no bruit.  No HSM or HJR Distal pulses intact with no bruits No edema Neuro non-focal Skin warm and dry No muscular weakness  Medications Current Outpatient Prescriptions  Medication Sig Dispense Refill  . losartan (COZAAR) 100 MG tablet TAKE 1 TABLET DAILY NEED OFFICE VISIT FOR FURTHER REFILLS 30 tablet 0   No current facility-administered medications for this visit.    Allergies Hctz  Family History: Family History  Problem Relation Age of Onset  . Coronary artery disease Mother   . Other Brother     fluid around heart  . Other Other     hepatic issues-? maternal side    Social History: History   Social History  . Marital Status: Married    Spouse Name: N/A    Number of Children: 4  . Years of Education: N/A   Occupational History  . Housekeeper     at hotel  .     Social History Main Topics  . Smoking status: Never Smoker   . Smokeless tobacco: Never Used  . Alcohol Use: No  . Drug Use: No  . Sexual Activity: No   Other Topics Concern  . Not on file   Social History Narrative    No past surgical history on file.  Past Medical History  Diagnosis Date  . HTN (hypertension)   . Hx of cardiovascular stress test     a. ETT-Myoview 05/28/12: Low risk study with a small, mild fixed basal inferior perfusion defect most likely representing soft tissue attenuation with inferolateral HK (wall motion abnormality does not correlate with inferior perfusion defect), EF 49%.  Marland Kitchen  Hx of echocardiogram     a. Echocardiogram 06/11/12: EF 50-55%, normal wall motion, grade 1 diastolic dysfunction, trivial AI, mild MR, mild LAE, mild RAE.  . RUQ pain   . Anemia     Electrocardiogram:  SR rate 69  PVC;s  Lateral T wave changes   Assessment and Plan

## 2014-05-26 NOTE — Progress Notes (Signed)
Patient awakening,vss,report to rn 

## 2014-05-26 NOTE — Op Note (Signed)
[place]  [address]. [place], [id number]   ENDOSCOPY PROCEDURE REPORT  PATIENT: [name] [name], [name] [name]  MR#: [id number] BIRTHDATE: Mar 10, 1971 , 42  yrs. old GENDER: female ENDOSCOPIST: Jerene Bears, MD REFERRED BY:  Ruben Reason, M.D. PROCEDURE DATE:  05/26/2014 PROCEDURE:  EGD w/ biopsy for H.pylori ASA CLASS:     Class II INDICATIONS:  abdominal pain in the upper right quadrant and nausea.  MEDICATIONS: Monitored anesthesia care and Propofol 250 mg IV TOPICAL ANESTHETIC: none  DESCRIPTION OF PROCEDURE: After the risks benefits and alternatives of the procedure were thoroughly explained, informed consent was obtained.  The LB [id number] endoscope was introduced through the mouth and advanced to the second portion of the duodenum , Without limitations.  The instrument was slowly withdrawn as the mucosa was fully examined.   ESOPHAGUS: The mucosa of the esophagus appeared normal.  STOMACH: Gastritis (inflammation), mild and characterized by erythema, was found in the gastric cardia, gastric body, gastric fundus.  Cold forcep biopsies were taken at the gastric cardia, gastric body, antrum and angularis to evaluate for h.  pylori.  DUODENUM: The duodenal mucosa showed no abnormalities in the bulb and 2nd part of the duodenum.  Retroflexed views revealed as previously described.     The scope was then withdrawn from the patient and the procedure completed.  COMPLICATIONS: There were no immediate complications.  ENDOSCOPIC IMPRESSION: 1.   The mucosa of the esophagus appeared normal 2.   Mild gastritis (inflammation) was found in the gastric body, gastric fundus, and cardia; multiple biopsies 3.   The duodenal mucosa showed no abnormalities in the bulb and 2nd part of the duodenum  RECOMMENDATIONS: 1.  Await biopsy results 2.  Follow-up of helicobacter pylori status, treat if indicated  eSigned:  Jerene Bears, MD 05/26/2014 2:52 PM    CC:The  Patient and Ruben Reason, MD

## 2014-05-26 NOTE — Patient Instructions (Signed)
Your physician wants you to follow-up in:  6 MONTHS WITH DR NISHAN  You will receive a reminder letter in the mail two months in advance. If you don't receive a letter, please call our office to schedule the follow-up appointment. Your physician recommends that you continue on your current medications as directed. Please refer to the Current Medication list given to you today. 

## 2014-05-26 NOTE — Patient Instructions (Signed)

## 2014-05-26 NOTE — Assessment & Plan Note (Signed)
Atypical previously normal myovue no need for stress testing at this time

## 2014-05-26 NOTE — Progress Notes (Signed)
No problems noted in the recovery room. maw 

## 2014-05-26 NOTE — Assessment & Plan Note (Signed)
Well controlled.  Continue current medications and low sodium Dash type diet.   Previously on verapamil for PVC;s can consider changing to beta blocker or verapamil If palpitatons return

## 2014-05-26 NOTE — Progress Notes (Signed)
Called to room to assist during endoscopic procedure.  Patient ID and intended procedure confirmed with present staff. Received instructions for my participation in the procedure from the performing physician.  

## 2014-05-27 ENCOUNTER — Telehealth: Payer: Self-pay | Admitting: *Deleted

## 2014-05-27 NOTE — Telephone Encounter (Signed)
  Follow up Call-  Call back number 05/26/2014  Post procedure Call Back phone  # [phone]  Permission to leave phone message Yes     Patient questions:  Do you have a fever, pain , or abdominal swelling? No. Pain Score  0 *  Have you tolerated food without any problems? Yes.    Have you been able to return to your normal activities? Yes.    Do you have any questions about your discharge instructions: Diet   No. Medications  No. Follow up visit  No.  Do you have questions or concerns about your Care? No.  Actions: * If pain score is 4 or above: No action needed, pain <4.Spoke with her husband.  States she is doing well. Advised to call us back if any problems or concerns.

## 2014-06-02 ENCOUNTER — Encounter: Payer: Self-pay | Admitting: Internal Medicine

## 2014-06-03 ENCOUNTER — Other Ambulatory Visit: Payer: Self-pay

## 2014-06-03 DIAGNOSIS — Z8619 Personal history of other infectious and parasitic diseases: Secondary | ICD-10-CM

## 2014-06-03 MED ORDER — OMEPRAZOLE 20 MG PO CPDR: 20.0000 mg | DELAYED_RELEASE_CAPSULE | Freq: Two times a day (BID) | ORAL | Status: DC

## 2014-06-03 MED ORDER — BIS SUBCIT-METRONID-TETRACYC 140-125-125 MG PO CAPS: 3.0000 | ORAL_CAPSULE | Freq: Three times a day (TID) | ORAL | Status: DC

## 2014-06-30 ENCOUNTER — Other Ambulatory Visit: Payer: Self-pay | Admitting: Physician Assistant

## 2014-07-28 ENCOUNTER — Telehealth: Payer: Self-pay

## 2014-07-28 NOTE — Telephone Encounter (Signed)
-----   Message from Maury Dus, RN sent at 06/03/2014  2:03 PM EST ----- Regarding: H pylori stool Needs h pylori stool in 8 weeks

## 2014-07-28 NOTE — Telephone Encounter (Signed)
Pt aware.

## 2014-08-06 ENCOUNTER — Other Ambulatory Visit: Payer: Self-pay | Admitting: Physician Assistant

## 2014-08-29 ENCOUNTER — Ambulatory Visit (INDEPENDENT_AMBULATORY_CARE_PROVIDER_SITE_OTHER): Payer: 59 | Admitting: Family Medicine

## 2014-08-29 VITALS — BP 140/84 | HR 74 | Temp 97.8°F | Resp 12 | Ht 64.5 in | Wt 226.0 lb

## 2014-08-29 DIAGNOSIS — R1011 Right upper quadrant pain: Secondary | ICD-10-CM

## 2014-08-29 DIAGNOSIS — Z8619 Personal history of other infectious and parasitic diseases: Secondary | ICD-10-CM

## 2014-08-29 MED ORDER — DICYCLOMINE HCL 10 MG PO CAPS: ORAL_CAPSULE | ORAL | Status: DC

## 2014-08-29 NOTE — Patient Instructions (Signed)
Take the dicyclomine one pill at meals and bedtime only on an as-needed basis.  If worse at any time please return  If not improving at all over the next few weeks please return

## 2014-08-29 NOTE — Progress Notes (Signed)
Subjective: Patient has been continuing to have pain in the right upper abdomen. It doesn't hurt all the time, but she has some good days and bad days. She was treated by the gastroenterologist for H. pylori which showed on GI biopsy. This helped back in early December. However subsequent to that for the last few weeks his symptoms have returned. She is supposed to see the gastroenterologist back for a recheck of H. pylori in a couple of months. However she is reluctant to go back because she is in debt from the biopsy that was previously done. Apparently the endoscopy procedure was painful but the biopsy was not and she is having to pay $900 for that. She has not been vomiting, but she does hurt. Sometimes in the morning he gets to bothering her significantly. She hurts around under the right scapula also. She has had a variety of workup done including CT scans, and no other etiology for the pain is been found other than the H. pylori which is now been treated.  Her bowels move well and constipation is not a problem  Objective: Chest clear. Heart regular without murmur. Abdomen soft without masses. Is tender in the right abdomen under the rib cage and just to the right and above the umbilicus. Also hurts below her right scapula though she's not tender in that area. No CVA tenderness.  Assessment: Right upper quadrant abdominal pain, etiology still a little uncertain, no major pathology Recent H. pylori infection  Plan: Try some dicyclomine one pill 4 times daily only when needed for the pain. She may be having some biliary pain even though everything looks good. If symptoms continue to persist will need to be rechecked for the H. pylori after a little longer to see if it is been eradicated completely or not.

## 2014-09-11 ENCOUNTER — Other Ambulatory Visit: Payer: Self-pay | Admitting: Physician Assistant

## 2014-09-13 NOTE — Telephone Encounter (Signed)
Dr Linna Darner, you just saw pt in Feb, and also in Oct for GI problems, but don't see HTN addressed since last Feb 2015. I put message on pt's last RF early Feb to RTC for more RFs, but she didn't discuss HTN with you when she was here. Do you want me to give her just a 2 week RF w/note to RTC for HTN check up, or do you want to give her more RFs?

## 2014-11-27 ENCOUNTER — Ambulatory Visit (INDEPENDENT_AMBULATORY_CARE_PROVIDER_SITE_OTHER): Payer: 59 | Admitting: Family Medicine

## 2014-11-27 VITALS — BP 148/82 | HR 78 | Temp 98.2°F | Resp 17 | Ht 64.0 in | Wt 226.0 lb

## 2014-11-27 DIAGNOSIS — R1011 Right upper quadrant pain: Secondary | ICD-10-CM | POA: Diagnosis not present

## 2014-11-27 DIAGNOSIS — R109 Unspecified abdominal pain: Secondary | ICD-10-CM | POA: Diagnosis not present

## 2014-11-27 DIAGNOSIS — D649 Anemia, unspecified: Secondary | ICD-10-CM

## 2014-11-27 LAB — POCT URINALYSIS DIPSTICK
BILIRUBIN UA: NEGATIVE
Glucose, UA: NEGATIVE
KETONES UA: NEGATIVE
Leukocytes, UA: NEGATIVE
NITRITE UA: NEGATIVE
PH UA: 6.5
PROTEIN UA: NEGATIVE
RBC UA: NEGATIVE
Spec Grav, UA: 1.02
Urobilinogen, UA: 0.2

## 2014-11-27 LAB — COMPLETE METABOLIC PANEL WITH GFR
ALBUMIN: 4 g/dL (ref 3.5–5.2)
ALK PHOS: 54 U/L (ref 39–117)
ALT: 17 U/L (ref 0–35)
AST: 17 U/L (ref 0–37)
BUN: 10 mg/dL (ref 6–23)
CO2: 25 mEq/L (ref 19–32)
Calcium: 9.2 mg/dL (ref 8.4–10.5)
Chloride: 105 mEq/L (ref 96–112)
Creat: 0.68 mg/dL (ref 0.50–1.10)
GFR, Est African American: >89 mL/min
Glucose, Bld: 72 mg/dL (ref 70–99)
Potassium: 4 mEq/L (ref 3.5–5.3)
SODIUM: 139 meq/L (ref 135–145)
Total Bilirubin: 0.6 mg/dL (ref 0.2–1.2)
Total Protein: 7.1 g/dL (ref 6.0–8.3)

## 2014-11-27 LAB — POCT CBC
GRANULOCYTE PERCENT: 46.1 % (ref 37–80)
HEMATOCRIT: 33.1 % — AB (ref 37.7–47.9)
HEMOGLOBIN: 10.1 g/dL — AB (ref 12.2–16.2)
LYMPH, POC: 1.6 (ref 0.6–3.4)
MCH, POC: 26.1 pg — AB (ref 27–31.2)
MCHC: 30.6 g/dL — AB (ref 31.8–35.4)
MCV: 85.5 fL (ref 80–97)
MID (CBC): 0.3 (ref 0–0.9)
MPV: 8.8 fL (ref 0–99.8)
PLATELET COUNT, POC: 216 10*3/uL (ref 142–424)
POC Granulocyte: 1.6 — AB (ref 2–6.9)
POC LYMPH %: 46.5 % (ref 10–50)
POC MID %: 7.4 %M (ref 0–12)
RBC: 3.88 M/uL — AB (ref 4.04–5.48)
RDW, POC: 15 %
WBC: 3.4 10*3/uL — AB (ref 4.6–10.2)

## 2014-11-27 LAB — POCT UA - MICROSCOPIC ONLY
CASTS, UR, LPF, POC: NEGATIVE
CRYSTALS, UR, HPF, POC: NEGATIVE
Mucus, UA: NEGATIVE
YEAST UA: NEGATIVE

## 2014-11-27 LAB — LIPASE: Lipase: 10 U/L (ref 0–75)

## 2014-11-27 NOTE — Progress Notes (Signed)
Subjective:  Patient ID: [name] [name], female    DOB: 30-Sep-1970  Age: 44 y.o. MRN: [id number]  44 year old lady who has had a long history of right upper quadrant abdominal pain. She's been evaluated over the last several years. It is been over 2 years since she had a gallbladder ultrasound. She's had CT scans of abdomen twice and a nuclear medicine scan of the gallbladder once. None showed any pathology. She feels like she may need to go back to gastroenterologist for a repeat endoscopy. However she does not have a lot of money for this but she thinks it is about time. She hurts intermittently, not every day, in the right upper quadrant. It hurts more when she eats certain spicy or fried or fatty foods. She hurts in the right upper quadrant and through to the right subscapular area. Some nausea but no vomiting, diarrhea, constipation. Otherwise she's doing pretty well. 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 She is also concerned whether the back pain could come from her kidneys.  Past family social history reviewed   Objective:   Pleasant alert lady, moderately overweight, in no major distress. Chest clear. She said she had advanced cough. Heart regular without murmurs. Abdomen has normal bowel sounds, soft without Megaly mass or tenderness. No CVA tenderness.  Results for orders placed or performed in visit on 11/27/14  POCT CBC  Result Value Ref Range   WBC 3.4 (A) 4.6 - 10.2 K/uL   Lymph, poc 1.6 0.6 - 3.4   POC LYMPH PERCENT 46.5 10 - 50 %L   MID (cbc) 0.3 0 - 0.9   POC MID % 7.4 0 - 12 %M   POC Granulocyte 1.6 (A) 2 - 6.9   Granulocyte percent 46.1 37 - 80 %G   RBC 3.88 (A) 4.04 - 5.48 M/uL   Hemoglobin 10.1 (A) 12.2 - 16.2 g/dL   HCT, POC 33.1 (A) 37.7 - 47.9 %   MCV 85.5 80 - 97 fL   MCH, POC 26.1 (A) 27 - 31.2 pg   MCHC 30.6 (A) 31.8 - 35.4 g/dL   RDW, POC 15.0 %   Platelet Count, POC 216 142 - 424 K/uL   MPV 8.8 0 - 99.8 fL   POCT urinalysis dipstick  Result Value Ref Range   Color, UA yellow    Clarity, UA clear    Glucose, UA neg    Bilirubin, UA neg    Ketones, UA neg    Spec Grav, UA 1.020    Blood, UA neg    pH, UA 6.5    Protein, UA neg    Urobilinogen, UA 0.2    Nitrite, UA neg    Leukocytes, UA Negative   POCT UA - Microscopic Only  Result Value Ref Range   WBC, Ur, HPF, POC 0-1    RBC, urine, microscopic 0-1    Bacteria, U Microscopic trace    Mucus, UA neg    Epithelial cells, urine per micros 5-10    Crystals, Ur, HPF, POC neg    Casts, Ur, LPF, POC neg    Yeast, UA neg      Assessment & Plan:   Assessment: Right upper quadrant abdominal pain, with referral to right subscapular area, etiology undetermined Cough Anemia   Plan:  Gallbladder ultrasound CBC, C met, urinalysis, ferritin  Patient Instructions  We will let you know the results of your additional lab work  Please begin  taking iron pills (over-the-counter) one daily for about 2 months to rebuild your blood count  Someone will contact you regarding the scheduling of your gallbladder ultrasound  Depending on the results of the ultrasound, then we will decide whether we need to send you to a specialist again to have the scope light passed into your stomach again.     Tydarius Yawn, MD 11/27/2014

## 2014-11-27 NOTE — Patient Instructions (Signed)
We will let you know the results of your additional lab work  Please begin taking iron pills (over-the-counter) one daily for about 2 months to rebuild your blood count  Someone will contact you regarding the scheduling of your gallbladder ultrasound  Depending on the results of the ultrasound, then we will decide whether we need to send you to a specialist again to have the scope light passed into your stomach again.

## 2014-11-28 LAB — FERRITIN: FERRITIN: 9 ng/mL — AB (ref 10–291)

## 2014-12-04 ENCOUNTER — Ambulatory Visit
Admission: RE | Admit: 2014-12-04 | Discharge: 2014-12-04 | Disposition: A | Discharge status: Home / Self Care | Payer: 59 | Source: Ambulatory Visit | Attending: Family Medicine | Admitting: Family Medicine

## 2014-12-04 DIAGNOSIS — R1011 Right upper quadrant pain: Secondary | ICD-10-CM

## 2015-03-15 ENCOUNTER — Other Ambulatory Visit: Payer: Self-pay | Admitting: Family Medicine

## 2015-03-16 ENCOUNTER — Other Ambulatory Visit: Payer: Self-pay | Admitting: Family Medicine

## 2015-04-10 ENCOUNTER — Ambulatory Visit (INDEPENDENT_AMBULATORY_CARE_PROVIDER_SITE_OTHER): Payer: 59 | Admitting: Family Medicine

## 2015-04-10 VITALS — BP 154/100 | HR 73 | Temp 99.0°F | Resp 18 | Ht 64.0 in | Wt 227.0 lb

## 2015-04-10 DIAGNOSIS — R1011 Right upper quadrant pain: Secondary | ICD-10-CM

## 2015-04-10 DIAGNOSIS — I10 Essential (primary) hypertension: Secondary | ICD-10-CM | POA: Diagnosis not present

## 2015-04-10 DIAGNOSIS — Z8619 Personal history of other infectious and parasitic diseases: Secondary | ICD-10-CM | POA: Diagnosis not present

## 2015-04-10 DIAGNOSIS — Z23 Encounter for immunization: Secondary | ICD-10-CM

## 2015-04-10 DIAGNOSIS — D649 Anemia, unspecified: Secondary | ICD-10-CM

## 2015-04-10 LAB — POCT CBC
Granulocyte percent: 47.4 %G (ref 37–80)
HCT, POC: 34.8 % — AB (ref 37.7–47.9)
HEMOGLOBIN: 10.6 g/dL — AB (ref 12.2–16.2)
LYMPH, POC: 1.7 (ref 0.6–3.4)
MCH, POC: 26.6 pg — AB (ref 27–31.2)
MCHC: 30.4 g/dL — AB (ref 31.8–35.4)
MCV: 87.3 fL (ref 80–97)
MID (cbc): 0.3 (ref 0–0.9)
MPV: 8.7 fL (ref 0–99.8)
PLATELET COUNT, POC: 172 10*3/uL (ref 142–424)
POC Granulocyte: 1.8 — AB (ref 2–6.9)
POC LYMPH %: 44.9 % (ref 10–50)
POC MID %: 7.7 %M (ref 0–12)
RBC: 3.98 M/uL — AB (ref 4.04–5.48)
RDW, POC: 16.3 %
WBC: 3.7 10*3/uL — AB (ref 4.6–10.2)

## 2015-04-10 MED ORDER — LOSARTAN POTASSIUM 100 MG PO TABS: ORAL_TABLET | ORAL | Status: DC

## 2015-04-10 NOTE — Patient Instructions (Signed)
I will let you know the results of the other tests for the hepatitis B and liver in a few days.  Your red blood count has improved from 11.1 to 11.6. We want it above 12.2, but it is improving. Increase the iron to twice daily.  Plan to return in December for me to recheck this

## 2015-04-10 NOTE — Progress Notes (Signed)
Patient ID: [name] [name], female    DOB: 04-Aug-1970  Age: 44 y.o. MRN: [id number]  Chief Complaint  Patient presents with  . Generalized Body Aches    Onset 2 weeks  . Fatigue    2 weeks  . Headache    Subjective:   Pleasant lady 44 years old, from [place], whom I have seen many times in the past. She has been worrying because her husbands hepatitis be is getting worse. She has a history of being diagnosed with being hepatitis B positive over 15 years ago. She has not been back to [place] anytime recently. She's been having body aches and generalized fatigue over the last couple of weeks. She has had left-sided facial headache and had some redness of the left side of her face apparently. She said she felt hot and feverish at night but does not have a temperature. She has not had any nausea or vomiting. No chest pains or shortness of breath. No GI or GU complaints. She apparently takes her medicines for the blood pressure as directed.  Current allergies, medications, problem list, past/family and social histories reviewed.  Objective:  BP 154/100 mmHg  Pulse 73  Temp(Src) 99 F (37.2 C) (Oral)  Resp 18  Ht 5\' 4"  (1.626 m)  Wt 227 lb (102.967 kg)  BMI 38.95 kg/m2  SpO2 98%  LMP 03/20/2015  No acute distress. Overweight lady. TMs normal. Throat clear. Eyes PERRLA. Chest clear. Heart regular without murmurs gallops or arrhythmias. Abdomen has normal bowel sounds. Soft without organomegaly, masses, or tenderness. She has just had a vague right upper quadrant pain, not hurting a lot today. She is quite anxious about her husband at about her risks of hepatitis B problems.  Was anemic last time also.  Assessment & Plan:   Assessment: 1. RUQ pain   2. Essential hypertension   3. History of hepatitis B   4. Anemia, unspecified anemia type   5. Needs flu shot       Plan: Will check a CMP because of the blood pressure and the ability to check liver enzymes that way. Also  check her hepatitis B. May need HBV DNA or HBeAg but will see what other shows.  Orders Placed This Encounter  Procedures  . Flu Vaccine QUAD 36+ mos IM  . Comprehensive metabolic panel  . Hepatitis B surface antibody  . POCT CBC    Meds ordered this encounter  Medications  . losartan (COZAAR) 100 MG tablet    Sig: TAKE 1 TABLET BY MOUTH DAILY    Dispense:  30 tablet    Refill:  5   Results for orders placed or performed in visit on 04/10/15  POCT CBC  Result Value Ref Range   WBC 3.7 (A) 4.6 - 10.2 K/uL   Lymph, poc 1.7 0.6 - 3.4   POC LYMPH PERCENT 44.9 10 - 50 %L   MID (cbc) 0.3 0 - 0.9   POC MID % 7.7 0 - 12 %M   POC Granulocyte 1.8 (A) 2 - 6.9   Granulocyte percent 47.4 37 - 80 %G   RBC 3.98 (A) 4.04 - 5.48 M/uL   Hemoglobin 10.6 (A) 12.2 - 16.2 g/dL   HCT, POC 34.8 (A) 37.7 - 47.9 %   MCV 87.3 80 - 97 fL   MCH, POC 26.6 (A) 27 - 31.2 pg   MCHC 30.4 (A) 31.8 - 35.4 g/dL   RDW, POC 16.3 %   Platelet Count, POC  172 142 - 424 K/uL   MPV 8.7 0 - 99.8 fL         Patient Instructions  I will let you know the results of the other tests for the hepatitis B and liver in a few days.  Your red blood count has improved from 11.1 to 11.6. We want it above 12.2, but it is improving. Increase the iron to twice daily.  Plan to return in December for me to recheck this     No Follow-up on file.   HOPPER,DAVID, MD 04/10/2015

## 2015-04-11 LAB — COMPREHENSIVE METABOLIC PANEL
ALK PHOS: 53 U/L (ref 33–115)
ALT: 20 U/L (ref 6–29)
AST: 21 U/L (ref 10–35)
Albumin: 4.1 g/dL (ref 3.6–5.1)
BILIRUBIN TOTAL: 0.6 mg/dL (ref 0.2–1.2)
BUN: 8 mg/dL (ref 7–25)
CO2: 26 mmol/L (ref 20–31)
Calcium: 9.2 mg/dL (ref 8.6–10.2)
Chloride: 104 mmol/L (ref 98–110)
Creat: 0.75 mg/dL (ref 0.50–1.10)
Glucose, Bld: 73 mg/dL (ref 65–99)
Potassium: 3.7 mmol/L (ref 3.5–5.3)
Sodium: 135 mmol/L (ref 135–146)
TOTAL PROTEIN: 7.5 g/dL (ref 6.1–8.1)

## 2015-04-11 LAB — HEPATITIS B SURFACE ANTIBODY, QUANTITATIVE: Hepatitis B-Post: 0.4 m[IU]/mL

## 2015-08-31 ENCOUNTER — Ambulatory Visit (INDEPENDENT_AMBULATORY_CARE_PROVIDER_SITE_OTHER): Payer: 59 | Admitting: Family Medicine

## 2015-08-31 VITALS — BP 140/88 | HR 72 | Temp 99.2°F | Resp 18 | Ht 65.0 in | Wt 227.2 lb

## 2015-08-31 DIAGNOSIS — R1011 Right upper quadrant pain: Secondary | ICD-10-CM | POA: Diagnosis not present

## 2015-08-31 DIAGNOSIS — Z8619 Personal history of other infectious and parasitic diseases: Secondary | ICD-10-CM

## 2015-08-31 LAB — POCT URINALYSIS DIP (MANUAL ENTRY)
Bilirubin, UA: NEGATIVE
GLUCOSE UA: NEGATIVE
Ketones, POC UA: NEGATIVE
LEUKOCYTES UA: NEGATIVE
NITRITE UA: NEGATIVE
Protein Ur, POC: NEGATIVE
RBC UA: NEGATIVE
Spec Grav, UA: 1.02
Urobilinogen, UA: 0.2
pH, UA: 6.5

## 2015-08-31 LAB — POC MICROSCOPIC URINALYSIS (UMFC): Mucus: ABSENT

## 2015-08-31 NOTE — Progress Notes (Signed)
Subjective:    Patient ID: [name] [name], female    DOB: 08-07-1970, 45 y.o.   MRN: [id number]  HPI This is a pleasant 45 yo female who presents today with return of right upper quadrant pain. She has had this multiple times in the past with negative work up (abdominal US x 2, CT abd/pelvis x 2). 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 The pain radiates to her right shoulder and is accompanied by nausea, no vomiting. Feels hungry a lot. Pain worse with spicy and fried foods. No diarrhea. BMs 2x/ day. No blood, no constipation. No medication for pain. Was diagnosed with h.pylori and completed treatment 10/15. She did not go back for eradication verification. She can not afford to go back to GI at this time.   She is married and she has 4 children (ages 54, 16, 49, 49). She is from [place]. She still has family there, including her mother. She has been at her current job for many years. 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    Past Medical History  Diagnosis Date  . HTN (hypertension)   . Hx of cardiovascular stress test     a. ETT-Myoview 05/28/12: Low risk study with a small, mild fixed basal inferior perfusion defect most likely representing soft tissue attenuation with inferolateral HK (wall motion abnormality does not correlate with inferior perfusion defect), EF 49%.  Marland Kitchen Hx of echocardiogram     a. Echocardiogram 06/11/12: EF 50-55%, normal wall motion, grade 1 diastolic dysfunction, trivial AI, mild MR, mild LAE, mild RAE.  . RUQ pain   . Anemia    History reviewed. No pertinent past surgical history. Family History  Problem Relation Age of Onset  . Coronary artery disease Mother   . Other Brother     fluid around heart  . Other Other     hepatic issues-? maternal side   Social History  Substance Use Topics  . Smoking status: Never Smoker   . Smokeless tobacco: Never Used  . Alcohol Use: No      Review of Systems    Constitutional: Positive for appetite change (more hungry). Negative for fever, fatigue and unexpected weight change.  Respiratory: Negative for cough and shortness of breath.   Cardiovascular: Negative for chest pain and leg swelling.  Gastrointestinal: Positive for nausea and abdominal pain. Negative for vomiting, diarrhea, constipation and blood in stool.   Results for orders placed or performed in visit on 08/31/15  POCT urinalysis dipstick  Result Value Ref Range   Color, UA yellow yellow   Clarity, UA clear clear   Glucose, UA negative negative   Bilirubin, UA negative negative   Ketones, POC UA negative negative   Spec Grav, UA 1.020    Blood, UA negative negative   pH, UA 6.5    Protein Ur, POC negative negative   Urobilinogen, UA 0.2    Nitrite, UA Negative Negative   Leukocytes, UA Negative Negative  POCT Microscopic Urinalysis ([facility])  Result Value Ref Range   WBC,UR,HPF,POC None None WBC/hpf   RBC,UR,HPF,POC None None RBC/hpf   Bacteria Few (A) None, Too numerous to count   Mucus Absent Absent   Epithelial Cells, UR Per Microscopy Few (A) None, Too numerous to count cells/hpf        Objective:   Physical Exam  Constitutional: She is oriented to person, place, and time. She appears well-developed and well-nourished.  obese  HENT:  Head: Normocephalic and atraumatic.  Eyes: Conjunctivae are normal.  Neck: Normal range of motion. Neck supple.  Cardiovascular: Normal rate, regular rhythm and normal heart sounds.   Pulmonary/Chest: Effort normal and breath sounds normal.  Abdominal: Soft. Bowel sounds are normal. She exhibits no distension. There is no tenderness. There is no rebound and no guarding.  Musculoskeletal: Normal range of motion.  Lymphadenopathy:    She has no cervical adenopathy.  Neurological: She is alert and oriented to person, place, and time.  Skin: Skin is warm and dry.  Psychiatric: She has a normal mood and affect. Her behavior is normal.  Judgment and thought content normal.  Vitals reviewed.     BP 140/88 mmHg  Pulse 72  Temp(Src) 99.2 F (37.3 C) (Oral)  Resp 18  Ht 5\' 5"  (1.651 m)  Wt 227 lb 3.2 oz (103.057 kg)  BMI 37.81 kg/m2  SpO2 99%  LMP 08/14/2015 Depression screen [facility] 2/9 08/31/2015 04/10/2015 11/27/2014  Decreased Interest 0 0 0  Down, Depressed, Hopeless 0 0 0  PHQ - 2 Score 0 0 0       Assessment & Plan:  1. Right upper quadrant pain - POCT urinalysis dipstick - POCT Microscopic Urinalysis ([facility]) - H. pylori breath test - urine ok - normal cbc/cmet 10/16 - this is a chronic complaint with negative GI work up other than h. Pylori. Will check for eradication and see her back if no improvement with otc analgesics and heat.  2. History of Helicobacter pylori infection - H. pylori breath test   Clarene Reamer, FNP-BC  [facility]  09/02/2015 9:26 AM

## 2015-08-31 NOTE — Patient Instructions (Signed)
Please take acetaminophen 2 tablets every 8 hours as needed for pain Use heat as needed We will call you about your stomach bacteria (h. Pylori) test

## 2015-09-01 LAB — H. PYLORI BREATH TEST: H. pylori Breath Test: NOT DETECTED

## 2015-10-13 ENCOUNTER — Other Ambulatory Visit: Payer: Self-pay | Admitting: Family Medicine

## 2015-10-14 ENCOUNTER — Ambulatory Visit (INDEPENDENT_AMBULATORY_CARE_PROVIDER_SITE_OTHER): Payer: 59

## 2015-10-14 ENCOUNTER — Ambulatory Visit (INDEPENDENT_AMBULATORY_CARE_PROVIDER_SITE_OTHER): Payer: 59 | Admitting: Family Medicine

## 2015-10-14 VITALS — BP 122/74 | HR 60 | Temp 98.2°F | Resp 18 | Ht 65.0 in | Wt 227.0 lb

## 2015-10-14 DIAGNOSIS — R1011 Right upper quadrant pain: Secondary | ICD-10-CM | POA: Diagnosis not present

## 2015-10-14 DIAGNOSIS — D649 Anemia, unspecified: Secondary | ICD-10-CM

## 2015-10-14 LAB — POCT CBC
Granulocyte percent: 42 %G (ref 37–80)
HEMATOCRIT: 31 % — AB (ref 37.7–47.9)
HEMOGLOBIN: 9.9 g/dL — AB (ref 12.2–16.2)
Lymph, poc: 2.1 (ref 0.6–3.4)
MCH: 25.8 pg — AB (ref 27–31.2)
MCHC: 32.1 g/dL (ref 31.8–35.4)
MCV: 80.4 fL (ref 80–97)
MID (cbc): 0.4 (ref 0–0.9)
MPV: 9.1 fL (ref 0–99.8)
POC Granulocyte: 1.8 — AB (ref 2–6.9)
POC LYMPH PERCENT: 48.5 %L (ref 10–50)
POC MID %: 9.5 % (ref 0–12)
Platelet Count, POC: 209 10*3/uL (ref 142–424)
RBC: 3.85 M/uL — AB (ref 4.04–5.48)
RDW, POC: 17.7 %
WBC: 4.3 10*3/uL — AB (ref 4.6–10.2)

## 2015-10-14 NOTE — Patient Instructions (Addendum)
Even though you do not sound like you're constipated, you may have some excessive stool backing up in your system causing some of your discomfort. I would encourage you to take a dose of MiraLAX every day for a few days until your stools are on the loose side to see if that helps the pain some.  We are scheduling you for an ultrasound of your abdomen again. Someone should call you with the appointment for the ultrasound, but it will probably be next week since this is eased weekend  I will let you know the results of your labs in a few days.  I am not giving you any other medicines yet until I see these results, but if you're getting a lot worse at anytime come back or go to the emergency room if necessary    IF you received an x-ray today, you will receive an invoice from [facility]. Please contact [facility] at [phone] with questions or concerns regarding your invoice.   IF you received labwork today, you will receive an invoice from [employer]. Please contact [facility] at [phone] with questions or concerns regarding your invoice.   Our billing staff will not be able to assist you with questions regarding bills from these companies.  You will be contacted with the lab results as soon as they are available. The fastest way to get your results is to activate your My Chart account. Instructions are located on the last page of this paperwork. If you have not heard from [place] regarding the results in 2 weeks, please contact this office.

## 2015-10-14 NOTE — Progress Notes (Signed)
Patient ID: [name] [name], female    DOB: Oct 21, 1970  Age: 45 y.o. MRN: [id number]  Chief Complaint  Patient presents with  . Flank Pain    right side x2 weeks    Subjective:   45 year old lady well-known to me. She has been having more problems with her right upper quadrant pain. She has had this off and on over the years has been assessed. She's not been having any vomiting. Does have occasional nausea. She does not drink any alcohol. She has had CT and ultrasound in the past, but it is been a while. The pain seemed to been bothering her more lately. She's not weight loss. She says her bowels active okay. No blood in the stool. No problems with the urinary tract. History of hep b.  Current allergies, medications, problem list, past/family and social histories reviewed.  Objective:  BP 122/74 mmHg  Pulse 60  Temp(Src) 98.2 F (36.8 C) (Oral)  Resp 18  Ht 5\' 5"  (1.651 m)  Wt 227 lb (102.967 kg)  BMI 37.77 kg/m2  SpO2 99%  LMP 10/02/2015  No major acute distress. Overweight. Chest clear. Heart regular without murmurs. Abdomen has no organomegaly or masses. Has normal bowel sounds. Soft. Minimal right upper quadrant tenderness  Assessment & Plan:   Assessment: 1. Right upper quadrant pain   2. Anemia, unspecified anemia type       Plan: Get abdominal x-rays and CBC  Orders Placed This Encounter  Procedures  . DG Abd 2 Views    Standing Status: Future     Number of Occurrences: 1     Standing Expiration Date: 10/13/2016    Order Specific Question:  Reason for Exam (SYMPTOM  OR DIAGNOSIS REQUIRED)    Answer:  ruq abdominal pain    Order Specific Question:  Is the patient pregnant?    Answer:  No    Order Specific Question:  Preferred imaging location?    Answer:  External  . US Abdomen Limited RUQ    Standing Status: Future     Number of Occurrences:      Standing Expiration Date: 12/13/2016    Order Specific Question:  Reason for Exam (SYMPTOM  OR DIAGNOSIS  REQUIRED)    Answer:  ruq pain, nausea    Order Specific Question:  Preferred imaging location?    Answer:  External  . Comprehensive metabolic panel  . Lipase  . Iron and TIBC  . POCT CBC  . POC Hemoccult Bld/Stl (3-Cd Home Screen)    Standing Status: Future     Number of Occurrences:      Standing Expiration Date: 10/13/2016    No orders of the defined types were placed in this encounter.   Abdominal x-rays nonspecific. No obvious abnormalities. Does have a moderate amount of stool in the colon.         Patient Instructions   Even though you do not sound like you're constipated, you may have some excessive stool backing up in your system causing some of your discomfort. I would encourage you to take a dose of MiraLAX every day for a few days until your stools are on the loose side to see if that helps the pain some.  We are scheduling you for an ultrasound of your abdomen again. Someone should call you with the appointment for the ultrasound, but it will probably be next week since this is eased weekend  I will let you know the results of your labs in a  few days.  I am not giving you any other medicines yet until I see these results, but if you're getting a lot worse at anytime come back or go to the emergency room if necessary    IF you received an x-ray today, you will receive an invoice from [facility]. Please contact [facility] at [phone] with questions or concerns regarding your invoice.   IF you received labwork today, you will receive an invoice from [employer]. Please contact [facility] at [phone] with questions or concerns regarding your invoice.   Our billing staff will not be able to assist you with questions regarding bills from these companies.  You will be contacted with the lab results as soon as they are available. The fastest way to get your results is to activate your My Chart account. Instructions  are located on the last page of this paperwork. If you have not heard from [place] regarding the results in 2 weeks, please contact this office.          Return in about 4 weeks (around 11/08/2015).   Kaidynce Pfister, MD 10/14/2015

## 2015-10-15 LAB — IRON AND TIBC
%SAT: 5 % — AB (ref 11–50)
IRON: 23 ug/dL — AB (ref 40–190)
TIBC: 477 ug/dL — AB (ref 250–450)
UIBC: 454 ug/dL — AB (ref 125–400)

## 2015-10-15 LAB — COMPREHENSIVE METABOLIC PANEL
ALBUMIN: 4.2 g/dL (ref 3.6–5.1)
ALT: 17 U/L (ref 6–29)
AST: 17 U/L (ref 10–35)
Alkaline Phosphatase: 55 U/L (ref 33–115)
BUN: 12 mg/dL (ref 7–25)
CALCIUM: 9 mg/dL (ref 8.6–10.2)
CHLORIDE: 106 mmol/L (ref 98–110)
CO2: 24 mmol/L (ref 20–31)
Creat: 0.75 mg/dL (ref 0.50–1.10)
Glucose, Bld: 74 mg/dL (ref 65–99)
POTASSIUM: 4.5 mmol/L (ref 3.5–5.3)
Sodium: 140 mmol/L (ref 135–146)
TOTAL PROTEIN: 7.4 g/dL (ref 6.1–8.1)
Total Bilirubin: 0.4 mg/dL (ref 0.2–1.2)

## 2015-10-15 LAB — LIPASE: LIPASE: 19 U/L (ref 7–60)

## 2015-10-21 LAB — POC HEMOCCULT BLD/STL (HOME/3-CARD/SCREEN)
Card #2 Fecal Occult Blod, POC: NEGATIVE
FECAL OCCULT BLD: NEGATIVE
FECAL OCCULT BLD: POSITIVE

## 2015-10-21 NOTE — Addendum Note (Signed)
Addended by: Frutoso Chase A on: 10/21/2015 08:17 AM   Modules accepted: Orders

## 2015-10-25 ENCOUNTER — Ambulatory Visit
Admission: RE | Admit: 2015-10-25 | Discharge: 2015-10-25 | Disposition: A | Discharge status: Home / Self Care | Payer: 59 | Source: Ambulatory Visit | Attending: Family Medicine | Admitting: Family Medicine

## 2015-10-25 DIAGNOSIS — R1011 Right upper quadrant pain: Secondary | ICD-10-CM

## 2015-11-18 ENCOUNTER — Other Ambulatory Visit: Payer: Self-pay | Admitting: Family Medicine

## 2015-12-05 ENCOUNTER — Other Ambulatory Visit: Payer: Self-pay | Admitting: Physician Assistant

## 2015-12-07 ENCOUNTER — Ambulatory Visit (INDEPENDENT_AMBULATORY_CARE_PROVIDER_SITE_OTHER): Payer: 59 | Admitting: Physician Assistant

## 2015-12-07 VITALS — BP 136/88 | HR 71 | Temp 98.1°F | Resp 16 | Ht 64.0 in | Wt 223.0 lb

## 2015-12-07 DIAGNOSIS — D509 Iron deficiency anemia, unspecified: Secondary | ICD-10-CM

## 2015-12-07 DIAGNOSIS — I10 Essential (primary) hypertension: Secondary | ICD-10-CM

## 2015-12-07 DIAGNOSIS — R12 Heartburn: Secondary | ICD-10-CM

## 2015-12-07 MED ORDER — RANITIDINE HCL 150 MG PO TABS: 150.0000 mg | ORAL_TABLET | Freq: Two times a day (BID) | ORAL | Status: DC

## 2015-12-07 MED ORDER — LOSARTAN POTASSIUM 100 MG PO TABS: 100.0000 mg | ORAL_TABLET | Freq: Every day | ORAL | Status: DC

## 2015-12-07 NOTE — Progress Notes (Signed)
[name] [name]  MRN: [id number] DOB: [birth year]  Subjective:  Pt presents to clinic with need for her BP medication to be refilled and over the last 2 days she is having the sensation that when she swallows food it does not go down but she is having no problems with fluids - she gets nauseated when she eats and will sometimes get burning in her chest. She was treated or H pylori in 12/15 and has had symptoms similar symptoms intermittent;y.  She states that they have looked at her gallbladder a lot - she gets worse symptoms to more greasy for spicy the foods are.  She has been doing nothing to help the pain.  Patient Active Problem List   Diagnosis Date Noted  . PVC (premature ventricular contraction) 05/26/2014  . Diverticulosis 04/14/2014  . GERD (gastroesophageal reflux disease) 11/19/2012  . Chest pain 05/21/2012  . Obesity, morbid (more than 100 lbs over ideal weight or BMI > 40) ([place]) 05/30/2011  . Essential hypertension 04/29/2009    No current outpatient prescriptions on file prior to visit.   No current facility-administered medications on file prior to visit.    Allergies  Allergen Reactions  . Hctz [Hydrochlorothiazide]     Heart palpitations    Review of Systems  Constitutional: Negative for fever and chills.  Respiratory: Negative for shortness of breath.   Cardiovascular: Negative for chest pain, palpitations and leg swelling.  Gastrointestinal: Positive for nausea and abdominal pain (epigastric). Negative for vomiting, diarrhea and constipation.   Objective:  BP 136/88 mmHg  Pulse 71  Temp(Src) 98.1 F (36.7 C)  Resp 16  Ht 5\' 4"  (1.626 m)  Wt 223 lb (101.152 kg)  BMI 38.26 kg/m2  SpO2 100%  LMP 11/17/2015  Physical Exam  Constitutional: She is oriented to person, place, and time and well-developed, well-nourished, and in no distress.  HENT:  Head: Normocephalic and atraumatic.  Right Ear: Hearing and external ear normal.  Left Ear: Hearing and  external ear normal.  Eyes: Conjunctivae are normal.  Neck: Normal range of motion.  Cardiovascular: Normal rate, regular rhythm and normal heart sounds.   No murmur heard. Pulmonary/Chest: Effort normal and breath sounds normal.  Abdominal: Soft. Bowel sounds are normal. There is no hepatomegaly. There is tenderness (epigastric and slightly to the left side). There is no rebound, no guarding and negative Murphy's sign.  Musculoskeletal:       Right lower leg: She exhibits no edema.       Left lower leg: She exhibits no edema.  Neurological: She is alert and oriented to person, place, and time. Gait normal.  Skin: Skin is warm and dry.  Psychiatric: Mood, memory, affect and judgment normal.  Vitals reviewed.   Assessment and Plan :  Essential hypertension - Plan: losartan (COZAAR) 100 MG tablet - pt ran out of her BP medications today - she has well controlled BP and I will fill for 3 months because at that time she needs to have repeat CBC for her iron def anemia - she has been using her iron pills bid and she will continue to do such  Heartburn - Plan: ranitidine (ZANTAC) 150 MG tablet, COMPLETE METABOLIC PANEL WITH GFR - check labs - we will start with an H2 blocker for the next 2 weeks and some diet changes to more bland and less greasy foods - she will increase her spicy foods as her symptoms tolerate - she has had intermittent GERD issues - if this is  not improve she will RTC and at that time a PPI would be warranted because she did have neg H pylori testing 2/17  Anemia, iron deficiency - Plan: CBC with Differential/Platelet - she will continue her iron supplementation at bid dosing  Windell Hummingbird PA-C  [facility] 12/07/2015 7:11 PM

## 2015-12-07 NOTE — Patient Instructions (Addendum)
Try for the next week or so to eat more bland foods - start on the zantac and take this medication 2x.day to help with the sensation of your food getting stuck - do this for at least 2 weeks and if you are not getting better please come back to clinic and see me.  IF you received an x-ray today, you will receive an invoice from [facility]. Please contact [facility] at [phone] with questions or concerns regarding your invoice.   IF you received labwork today, you will receive an invoice from [employer]. Please contact [facility] at [phone] with questions or concerns regarding your invoice.   Our billing staff will not be able to assist you with questions regarding bills from these companies.  You will be contacted with the lab results as soon as they are available. The fastest way to get your results is to activate your My Chart account. Instructions are located on the last page of this paperwork. If you have not heard from [place] regarding the results in 2 weeks, please contact this office.

## 2015-12-08 LAB — COMPLETE METABOLIC PANEL WITH GFR
ALT: 15 U/L (ref 6–29)
AST: 16 U/L (ref 10–35)
Albumin: 4.2 g/dL (ref 3.6–5.1)
Alkaline Phosphatase: 48 U/L (ref 33–115)
BILIRUBIN TOTAL: 0.4 mg/dL (ref 0.2–1.2)
BUN: 13 mg/dL (ref 7–25)
CO2: 22 mmol/L (ref 20–31)
CREATININE: 0.82 mg/dL (ref 0.50–1.10)
Calcium: 9.1 mg/dL (ref 8.6–10.2)
Chloride: 104 mmol/L (ref 98–110)
GFR, EST NON AFRICAN AMERICAN: 86 mL/min (ref >=60)
GLUCOSE: 77 mg/dL (ref 65–99)
Potassium: 4.3 mmol/L (ref 3.5–5.3)
SODIUM: 139 mmol/L (ref 135–146)
TOTAL PROTEIN: 7.2 g/dL (ref 6.1–8.1)

## 2015-12-08 LAB — CBC WITH DIFFERENTIAL/PLATELET
BASOS ABS: 0 {cells}/uL (ref 0–200)
Basophils Relative: 0 %
EOS ABS: 48 {cells}/uL (ref 15–500)
EOS PCT: 1 %
HCT: 38.4 % (ref 35.0–45.0)
Hemoglobin: 12.5 g/dL (ref 11.7–15.5)
LYMPHS PCT: 40 %
Lymphs Abs: 1920 cells/uL (ref 850–3900)
MCH: 28.9 pg (ref 27.0–33.0)
MCHC: 32.6 g/dL (ref 32.0–36.0)
MCV: 88.7 fL (ref 80.0–100.0)
MONOS PCT: 8 %
Monocytes Absolute: 384 cells/uL (ref 200–950)
Neutro Abs: 2448 cells/uL (ref 1500–7800)
Neutrophils Relative %: 51 %
Platelets: 172 10*3/uL (ref 140–400)
RBC: 4.33 MIL/uL (ref 3.80–5.10)
RDW: 17.5 % — AB (ref 11.0–15.0)
WBC: 4.8 10*3/uL (ref 3.8–10.8)

## 2015-12-09 ENCOUNTER — Encounter: Payer: Self-pay | Admitting: Physician Assistant

## 2016-03-03 ENCOUNTER — Ambulatory Visit (INDEPENDENT_AMBULATORY_CARE_PROVIDER_SITE_OTHER): Payer: 59 | Admitting: Physician Assistant

## 2016-03-03 ENCOUNTER — Other Ambulatory Visit: Payer: Self-pay | Admitting: Physician Assistant

## 2016-03-03 VITALS — BP 132/80 | HR 63 | Temp 98.3°F | Resp 18 | Ht 64.0 in | Wt 229.2 lb

## 2016-03-03 DIAGNOSIS — D509 Iron deficiency anemia, unspecified: Secondary | ICD-10-CM | POA: Diagnosis not present

## 2016-03-03 DIAGNOSIS — I10 Essential (primary) hypertension: Secondary | ICD-10-CM | POA: Diagnosis not present

## 2016-03-03 LAB — CBC
HCT: 35.5 % (ref 35.0–45.0)
Hemoglobin: 11.5 g/dL — ABNORMAL LOW (ref 11.7–15.5)
MCH: 30.8 pg (ref 27.0–33.0)
MCHC: 32.4 g/dL (ref 32.0–36.0)
MCV: 95.2 fL (ref 80.0–100.0)
MPV: 13.1 fL — ABNORMAL HIGH (ref 7.5–12.5)
Platelets: 187 10*3/uL (ref 140–400)
RBC: 3.73 MIL/uL — ABNORMAL LOW (ref 3.80–5.10)
RDW: 12.3 % (ref 11.0–15.0)
WBC: 3.6 10*3/uL — AB (ref 3.8–10.8)

## 2016-03-03 MED ORDER — LOSARTAN POTASSIUM 100 MG PO TABS: 100.0000 mg | ORAL_TABLET | Freq: Every day | ORAL | 1 refills | Status: DC

## 2016-03-03 MED ORDER — LOSARTAN POTASSIUM 100 MG PO TABS: 100.0000 mg | ORAL_TABLET | Freq: Every day | ORAL | 2 refills | Status: DC

## 2016-03-03 NOTE — Progress Notes (Signed)
03/03/2016 2:53 PM   DOB: [birth year] / MRN: [id number]  SUBJECTIVE:  [name] [name] is a 45 y.o. female presenting for an anemia recheck and a BP recheck and medication refills.  She has been taking an OTC iron supplementation for about 3 months.  She has been taking at least one dose everyday and sometimes two. She is not missing doses of her BP medication.    She is allergic to hctz [hydrochlorothiazide].   She  has a past medical history of Anemia; HTN (hypertension); cardiovascular stress test; echocardiogram; and RUQ pain.    She  reports that she has never smoked. She has never used smokeless tobacco. She reports that she does not drink alcohol or use drugs. She  reports that she does not engage in sexual activity. The patient  has no past surgical history on file.  Her family history includes Coronary artery disease in her mother; Other in her brother and other.  Review of Systems  Respiratory: Negative for cough and shortness of breath.   Cardiovascular: Negative for chest pain.  Gastrointestinal: Negative for nausea.  Skin: Negative for rash.  Neurological: Negative for dizziness and headaches.  Psychiatric/Behavioral: Negative for depression.    The problem list and medications were reviewed and updated by myself where necessary and exist elsewhere in the encounter.   OBJECTIVE:  BP 132/80   Pulse 63   Temp 98.3 F (36.8 C) (Oral)   Resp 18   Ht 5\' 4"  (1.626 m)   Wt 229 lb 3.2 oz (104 kg)   LMP 02/23/2016   SpO2 100%   BMI 39.34 kg/m   Physical Exam  Constitutional: She is oriented to person, place, and time. She appears well-developed and well-nourished. No distress.  Cardiovascular: Normal rate, regular rhythm and normal heart sounds.   Pulmonary/Chest: Effort normal and breath sounds normal.  Musculoskeletal: Normal range of motion.  Neurological: She is alert and oriented to person, place, and time.  Skin: Skin is warm and dry. She is not diaphoretic.    Psychiatric: She has a normal mood and affect.     Lab Results  Component Value Date   NA 139 12/07/2015   K 4.3 12/07/2015   CL 104 12/07/2015   CO2 22 12/07/2015   Lab Results  Component Value Date   CREATININE 0.82 12/07/2015   Lab Results  Component Value Date   ALT 15 12/07/2015   AST 16 12/07/2015   ALKPHOS 48 12/07/2015   BILITOT 0.4 12/07/2015   Lab Results  Component Value Date   WBC 4.8 12/07/2015   HGB 12.5 12/07/2015   HCT 38.4 12/07/2015   MCV 88.7 12/07/2015   PLT 172 12/07/2015      No results found for this or any previous visit (from the past 72 hour(s)).  No results found.  ASSESSMENT AND PLAN  [name] was seen today for follow-up.  Diagnoses and all orders for this visit:  Essential hypertension: Well controlled.  Will continue current plan.  -     Discontinue: losartan (COZAAR) 100 MG tablet; Take 1 tablet (100 mg total) by mouth daily. -     losartan (COZAAR) 100 MG tablet; Take 1 tablet (100 mg total) by mouth daily.  Anemia, iron deficiency -     CBC    The patient is advised to call or return to clinic if she does not see an improvement in symptoms, or to seek the care of the closest emergency department if she worsens with  the above plan.   Philis Fendt, MHS, PA-C [facility] 03/03/2016 2:53 PM

## 2016-03-03 NOTE — Patient Instructions (Signed)
     IF you received an x-ray today, you will receive an invoice from [facility]. Please contact [facility] at [phone] with questions or concerns regarding your invoice.   IF you received labwork today, you will receive an invoice from [facility]. Please contact [facility] at [phone] with questions or concerns regarding your invoice.   Our billing staff will not be able to assist you with questions regarding bills from these companies.  You will be contacted with the lab results as soon as they are available. The fastest way to get your results is to activate your My Chart account. Instructions are located on the last page of this paperwork. If you have not heard from us regarding the results in 2 weeks, please contact this office.      

## 2016-03-05 LAB — IRON AND TIBC
%SAT: 18 % (ref 11–50)
Iron: 57 ug/dL (ref 40–190)
TIBC: 324 ug/dL (ref 250–450)
UIBC: 267 ug/dL (ref 125–400)

## 2016-03-05 LAB — FERRITIN: Ferritin: 16 ng/mL (ref 10–232)

## 2016-03-07 LAB — PATHOLOGIST SMEAR REVIEW

## 2016-04-03 ENCOUNTER — Ambulatory Visit (INDEPENDENT_AMBULATORY_CARE_PROVIDER_SITE_OTHER): Payer: 59 | Admitting: Family Medicine

## 2016-04-03 VITALS — BP 120/86 | HR 82 | Temp 98.3°F | Resp 18 | Ht 64.0 in | Wt 224.0 lb

## 2016-04-03 DIAGNOSIS — L720 Epidermal cyst: Secondary | ICD-10-CM

## 2016-04-03 DIAGNOSIS — R300 Dysuria: Secondary | ICD-10-CM | POA: Diagnosis not present

## 2016-04-03 DIAGNOSIS — D508 Other iron deficiency anemias: Secondary | ICD-10-CM

## 2016-04-03 DIAGNOSIS — I10 Essential (primary) hypertension: Secondary | ICD-10-CM | POA: Diagnosis not present

## 2016-04-03 DIAGNOSIS — D709 Neutropenia, unspecified: Secondary | ICD-10-CM | POA: Diagnosis not present

## 2016-04-03 DIAGNOSIS — R1031 Right lower quadrant pain: Secondary | ICD-10-CM

## 2016-04-03 LAB — POCT CBC
GRANULOCYTE PERCENT: 51.5 % (ref 37–80)
HCT, POC: 34.4 % — AB (ref 37.7–47.9)
Hemoglobin: 11.8 g/dL — AB (ref 12.2–16.2)
Lymph, poc: 1.8 (ref 0.6–3.4)
MCH: 31.5 pg — AB (ref 27–31.2)
MCHC: 34.4 g/dL (ref 31.8–35.4)
MCV: 91.7 fL (ref 80–97)
MID (CBC): 0.2 (ref 0–0.9)
MPV: 9.8 fL (ref 0–99.8)
POC GRANULOCYTE: 2.2 (ref 2–6.9)
POC LYMPH PERCENT: 42.9 %L (ref 10–50)
POC MID %: 5.6 % (ref 0–12)
Platelet Count, POC: 160 10*3/uL (ref 142–424)
RBC: 3.75 M/uL — AB (ref 4.04–5.48)
RDW, POC: 12.4 %
WBC: 4.3 10*3/uL — AB (ref 4.6–10.2)

## 2016-04-03 LAB — POCT URINALYSIS DIP (MANUAL ENTRY)
Bilirubin, UA: NEGATIVE
GLUCOSE UA: NEGATIVE
Ketones, POC UA: NEGATIVE
NITRITE UA: NEGATIVE
Protein Ur, POC: NEGATIVE
Spec Grav, UA: 1.025
Urobilinogen, UA: 0.2
pH, UA: 5.5

## 2016-04-03 LAB — POC MICROSCOPIC URINALYSIS (UMFC): Mucus: ABSENT

## 2016-04-03 LAB — IRON AND TIBC
%SAT: 31 % (ref 11–50)
Iron: 116 ug/dL (ref 40–190)
TIBC: 374 ug/dL (ref 250–450)
UIBC: 258 ug/dL (ref 125–400)

## 2016-04-03 MED ORDER — DOXYCYCLINE HYCLATE 100 MG PO CAPS: 100.0000 mg | ORAL_CAPSULE | Freq: Two times a day (BID) | ORAL | 0 refills | Status: DC

## 2016-04-03 MED ORDER — LOSARTAN POTASSIUM 100 MG PO TABS: 100.0000 mg | ORAL_TABLET | Freq: Every day | ORAL | 3 refills | Status: DC

## 2016-04-03 NOTE — Progress Notes (Signed)
Patient ID: [name] [name], female    DOB: 12-27-1970  Age: 45 y.o. MRN: [id number]  Chief Complaint  Patient presents with  . Follow-up    was told to RTC after not taking iron for 2 weeks     Subjective:   Patient whom I have seen a number of times in the past. She was told to come back in after holding her iron for a couple of weeks. She has a history of iron deficiency immediate which improved. However she persisted with a mild neutropenia which she is always had. There is certainly the possibility of a hemoglobinopathy, but I cannot find it is been tested. She is from [place], the [place].  She also is been hurting for the last couple of weeks in the right lower quadrant of the abdomen. She coughs. Also if she strains her urine it hurts her.  Current allergies, medications, problem list, past/family and social histories reviewed.  Objective:  BP 120/86   Pulse 82   Temp 98.3 F (36.8 C) (Oral)   Resp 18   Ht 5\' 4"  (1.626 m)   Wt 224 lb (101.6 kg)   LMP 12/03/2015   SpO2 99%   BMI 38.45 kg/m   Abdomen is soft. No hernia or masses can be palpated. Examined her standing also could not feel anything there.  Incidentally she also called attention to a cystic area on her left shoulder blade which has been getting worse the last couple of weeks causing her more pain. It is minimally warm to touch, almost 2-1/2 cm in diameter. Will speak to the PDA about doing an I&D.  Assessment & Plan:   Assessment: 1. Iron deficiency anemia secondary to inadequate dietary iron intake   2. Right groin pain   3. Dysuria   4. Epidermoid cyst of skin of shoulder   5. Neutropenia, unspecified type ([place])   6. Essential hypertension       Plan: See orders Will ask a physician assistant to do the I&D of the lesion on the shoulder.   Results for orders placed or performed in visit on 04/03/16  POCT CBC  Result Value Ref Range   WBC 4.3 (A) 4.6 - 10.2 K/uL   Lymph, poc 1.8 0.6  - 3.4   POC LYMPH PERCENT 42.9 10 - 50 %L   MID (cbc) 0.2 0 - 0.9   POC MID % 5.6 0 - 12 %M   POC Granulocyte 2.2 2 - 6.9   Granulocyte percent 51.5 37 - 80 %G   RBC 3.75 (A) 4.04 - 5.48 M/uL   Hemoglobin 11.8 (A) 12.2 - 16.2 g/dL   HCT, POC 34.4 (A) 37.7 - 47.9 %   MCV 91.7 80 - 97 fL   MCH, POC 31.5 (A) 27 - 31.2 pg   MCHC 34.4 31.8 - 35.4 g/dL   RDW, POC 12.4 %   Platelet Count, POC 160 142 - 424 K/uL   MPV 9.8 0 - 99.8 fL  POCT urinalysis dipstick  Result Value Ref Range   Color, UA yellow yellow   Clarity, UA clear clear   Glucose, UA negative negative   Bilirubin, UA negative negative   Ketones, POC UA negative negative   Spec Grav, UA 1.025    Blood, UA trace-intact (A) negative   pH, UA 5.5    Protein Ur, POC negative negative   Urobilinogen, UA 0.2    Nitrite, UA Negative Negative   Leukocytes, UA small (1+) (  A) Negative  POCT Microscopic Urinalysis ([facility])  Result Value Ref Range   WBC,UR,HPF,POC Few (A) None WBC/hpf   RBC,UR,HPF,POC Few (A) None RBC/hpf   Bacteria Few (A) None, Too numerous to count   Mucus Absent Absent   Epithelial Cells, UR Per Microscopy None None, Too numerous to count cells/hpf         Patient Instructions    If the groin pain gets worse please return for recheck or go to the emergency room if necessary. I believe you probably just strained her groin but could be trying to develop a hernia there.  The blood counts are satisfactory does still a tiny bit low. Plan to return in about 3 months to recheck this. I have ordered one other test on evaluating your hemoglobin, since some people from [place] have a different hemoglobin then it always runs a little low. I will let you know the results of that.  Return as directed for follow-up of the cyst on your back.    IF you received an x-ray today, you will receive an invoice from [facility]. Please contact [facility] at [phone] with questions or concerns regarding  your invoice.   IF you received labwork today, you will receive an invoice from [employer]. Please contact [facility] at [phone] with questions or concerns regarding your invoice.   Our billing staff will not be able to assist you with questions regarding bills from these companies.  You will be contacted with the lab results as soon as they are available. The fastest way to get your results is to activate your My Chart account. Instructions are located on the last page of this paperwork. If you have not heard from [place] regarding the results in 2 weeks, please contact this office.    Return in 2 days for packing removal  Incision and Drainage, Care After Refer to this sheet in the next few weeks. These instructions provide you with information on caring for yourself after your procedure. Your caregiver may also give you more specific instructions. Your treatment has been planned according to current medical practices, but problems sometimes occur. Call your caregiver if you have any problems or questions after your procedure. HOME CARE INSTRUCTIONS   If antibiotic medicine is given, take it as directed. Finish it even if you start to feel better.  Only take over-the-counter or prescription medicines for pain, discomfort, or fever as directed by your caregiver.  Keep all follow-up appointments as directed by your caregiver.  Change any bandages (dressings) as directed by your caregiver. Replace old dressings with clean dressings.  Wash your hands before and after caring for your wound. You will receive specific instructions for cleansing and caring for your wound.  SEEK MEDICAL CARE IF:   You have increased pain, swelling, or redness around the wound.  You have increased drainage, smell, or bleeding from the wound.  You have muscle aches, chills, or you feel generally sick.  You have a fever. MAKE SURE YOU:   Understand these instructions.  Will watch  your condition.  Will get help right away if you are not doing well or get worse.   This information is not intended to replace advice given to you by your health care provider. Make sure you discuss any questions you have with your health care provider.   Document Released: 09/11/2011 Document Revised: 07/10/2014 Document Reviewed: 09/11/2011 Elsevier Interactive Patient Education [employer].     No Follow-up on file.  HOPPER,DAVID, MD 04/03/2016

## 2016-04-03 NOTE — Progress Notes (Signed)
PROCEDURE NOTE: I&D  Verbal consent obtained. Local anesthesia with 2cc of 2% lidocaine without epinephrine Site cleansed with Betadine.  Incision of 1cm was made using a 11 blade, wound exploration revealed remnants of sebaceous sac and purulent material, wound expressed copious amounts of pus  serosanguinous fluid. Wound cavity was explored with curved hemostats and aggressively packed with 0.25" plain packing. Cleansed and dressed. After care instructions provided. Patient to return to clinic on 2 days for reevaluation/repacking.  Tenna Delaine, PA-C  [facility] 04/03/2016 1:29 PM

## 2016-04-03 NOTE — Patient Instructions (Addendum)
If the groin pain gets worse please return for recheck or go to the emergency room if necessary. I believe you probably just strained her groin but could be trying to develop a hernia there.  The blood counts are satisfactory does still a tiny bit low. Plan to return in about 3 months to recheck this. I have ordered one other test on evaluating your hemoglobin, since some people from [place] have a different hemoglobin then it always runs a little low. I will let you know the results of that.  Return as directed for follow-up of the cyst on your back.  Take doxycycline twice daily. Return in 2 days for packing removal.    IF you received an x-ray today, you will receive an invoice from [facility]. Please contact [facility] at [phone] with questions or concerns regarding your invoice.   IF you received labwork today, you will receive an invoice from [employer]. Please contact [facility] at [phone] with questions or concerns regarding your invoice.   Our billing staff will not be able to assist you with questions regarding bills from these companies.  You will be contacted with the lab results as soon as they are available. The fastest way to get your results is to activate your My Chart account. Instructions are located on the last page of this paperwork. If you have not heard from [place] regarding the results in 2 weeks, please contact this office.     Incision and Drainage, Care After Refer to this sheet in the next few weeks. These instructions provide you with information on caring for yourself after your procedure. Your caregiver may also give you more specific instructions. Your treatment has been planned according to current medical practices, but problems sometimes occur. Call your caregiver if you have any problems or questions after your procedure. HOME CARE INSTRUCTIONS   If antibiotic medicine is given, take it as directed. Finish  it even if you start to feel better.  Only take over-the-counter or prescription medicines for pain, discomfort, or fever as directed by your caregiver.  Keep all follow-up appointments as directed by your caregiver.  Change any bandages (dressings) as directed by your caregiver. Replace old dressings with clean dressings.  Wash your hands before and after caring for your wound. You will receive specific instructions for cleansing and caring for your wound.  SEEK MEDICAL CARE IF:   You have increased pain, swelling, or redness around the wound.  You have increased drainage, smell, or bleeding from the wound.  You have muscle aches, chills, or you feel generally sick.  You have a fever. MAKE SURE YOU:   Understand these instructions.  Will watch your condition.  Will get help right away if you are not doing well or get worse.   This information is not intended to replace advice given to you by your health care provider. Make sure you discuss any questions you have with your health care provider.   Document Released: 09/11/2011 Document Revised: 07/10/2014 Document Reviewed: 09/11/2011 Elsevier Interactive Patient Education [employer].

## 2016-04-05 ENCOUNTER — Encounter: Payer: Self-pay | Admitting: Physician Assistant

## 2016-04-05 ENCOUNTER — Ambulatory Visit (INDEPENDENT_AMBULATORY_CARE_PROVIDER_SITE_OTHER): Payer: 59 | Admitting: Physician Assistant

## 2016-04-05 VITALS — BP 128/80 | HR 82 | Temp 98.1°F | Resp 16 | Ht 64.0 in | Wt 224.0 lb

## 2016-04-05 DIAGNOSIS — L720 Epidermal cyst: Secondary | ICD-10-CM

## 2016-04-05 LAB — HEMOGLOBINOPATHY EVALUATION
HEMATOCRIT: 36.1 % (ref 35.0–45.0)
HEMOGLOBIN: 12 g/dL (ref 11.7–15.5)
HGB A: 96.4 % (ref >96.0)
Hgb A2 Quant: 2.4 % (ref 1.8–3.5)
Hgb F Quant: 1.2 % (ref <2.0)
MCH: 31.1 pg (ref 27.0–33.0)
MCV: 93.5 fL (ref 80.0–100.0)
RBC: 3.86 MIL/uL (ref 3.80–5.10)
RDW: 12.4 % (ref 11.0–15.0)

## 2016-04-05 NOTE — Progress Notes (Signed)
    MRN: [id number] DOB: Dec 15, 1970  Subjective:   [name] [name] is a 45 y.o. female presenting for follow up on wound.   Pt initially seen on 04/03/16 for infected sebaceous cyst. I&D performed and patient placed on doxycycline.   She has been taking antibiotics as prescribed and has using a heating pad. Has associated pain when water hits the wound. Denies pus drainage, fever, chills, and diaphoresis.   [name] [name] has a current medication list which includes the following prescription(s): doxycycline, losartan, and ranitidine. Also is allergic to hctz [hydrochlorothiazide].  [name] [name]  has a past medical history of Anemia; HTN (hypertension); cardiovascular stress test; echocardiogram; and RUQ pain. Also  has no past surgical history on file.  Objective:   Vitals: BP 128/80 (BP Location: Left Arm, Patient Position: Sitting, Cuff Size: Large)   Pulse 82   Temp 98.1 F (36.7 C) (Oral)   Resp 16   Ht 5\' 4"  (1.626 m)   Wt 224 lb (101.6 kg)   LMP 12/03/2015   SpO2 99%   BMI 38.45 kg/m   Physical Exam  Constitutional: She is oriented to person, place, and time. She appears well-developed and well-nourished.  HENT:  Head: Normocephalic and atraumatic.  Eyes: Conjunctivae are normal.  Neck: Normal range of motion.  Pulmonary/Chest: Effort normal.  Neurological: She is alert and oriented to person, place, and time.  Skin: Skin is warm and dry.     Psychiatric: She has a normal mood and affect.  Vitals reviewed.    No results found for this or any previous visit (from the past 24 hour(s)).  Assessment and Plan :  1. Epidermoid cyst of skin of shoulder -Wound repacked. -Continue antibiotics and heating pad.  -Return in 2 days for reevaluation.   Tenna Delaine, PA-C [facility] [phone] 04/05/2016 5:49 PM

## 2016-04-05 NOTE — Patient Instructions (Addendum)
  Continue to keep the area clean and dry. Change dressing if it becomes soaked. Follow up in 2 days for packing removal. Continue antibiotics.   . Notify the office if you experience any of the following signs of infection: Swelling, redness, pus drainage, streaking, fever >101.0 F     IF you received an x-ray today, you will receive an invoice from [facility]. Please contact [facility] at [phone] with questions or concerns regarding your invoice.   IF you received labwork today, you will receive an invoice from [employer]. Please contact [facility] at [phone] with questions or concerns regarding your invoice.   Our billing staff will not be able to assist you with questions regarding bills from these companies.  You will be contacted with the lab results as soon as they are available. The fastest way to get your results is to activate your My Chart account. Instructions are located on the last page of this paperwork. If you have not heard from [place] regarding the results in 2 weeks, please contact this office.

## 2016-04-06 LAB — WOUND CULTURE
GRAM STAIN: NONE SEEN
Gram Stain: NONE SEEN
Organism ID, Bacteria: NO GROWTH

## 2016-05-28 ENCOUNTER — Other Ambulatory Visit: Payer: Self-pay | Admitting: Physician Assistant

## 2016-05-28 DIAGNOSIS — I10 Essential (primary) hypertension: Secondary | ICD-10-CM

## 2016-06-01 ENCOUNTER — Other Ambulatory Visit: Payer: Self-pay | Admitting: Physician Assistant

## 2016-06-01 DIAGNOSIS — I10 Essential (primary) hypertension: Secondary | ICD-10-CM

## 2016-06-04 NOTE — Telephone Encounter (Signed)
03/2016 last ov 12/2015 last labs

## 2016-06-19 ENCOUNTER — Ambulatory Visit (INDEPENDENT_AMBULATORY_CARE_PROVIDER_SITE_OTHER): Payer: 59 | Admitting: Family Medicine

## 2016-06-19 VITALS — BP 122/74 | HR 98 | Temp 99.4°F | Resp 18 | Ht 64.0 in | Wt 229.0 lb

## 2016-06-19 DIAGNOSIS — R51 Headache: Secondary | ICD-10-CM

## 2016-06-19 DIAGNOSIS — I10 Essential (primary) hypertension: Secondary | ICD-10-CM

## 2016-06-19 DIAGNOSIS — L84 Corns and callosities: Secondary | ICD-10-CM

## 2016-06-19 DIAGNOSIS — R0981 Nasal congestion: Secondary | ICD-10-CM

## 2016-06-19 DIAGNOSIS — G44209 Tension-type headache, unspecified, not intractable: Secondary | ICD-10-CM | POA: Diagnosis not present

## 2016-06-19 DIAGNOSIS — R519 Headache, unspecified: Secondary | ICD-10-CM

## 2016-06-19 MED ORDER — LIDOCAINE 4 % EX CREA: 1.0000 "application " | TOPICAL_CREAM | Freq: Every day | CUTANEOUS | 0 refills | Status: DC | PRN

## 2016-06-19 MED ORDER — FLUTICASONE PROPIONATE 50 MCG/ACT NA SUSP: 1.0000 | Freq: Every day | NASAL | 6 refills | Status: DC

## 2016-06-19 MED ORDER — IBUPROFEN 800 MG PO TABS: 800.0000 mg | ORAL_TABLET | Freq: Three times a day (TID) | ORAL | 0 refills | Status: DC | PRN

## 2016-06-19 NOTE — Patient Instructions (Addendum)
For sinus congestion- take flonase every morning to help relieve headache in the face. Use aspercreme with lidocaine on the neck and shoulders- to help with headache in the back of the head and head. Corn- use ibuprofen for the pain from the corn especially at work  IF you received an x-ray today, you will receive an invoice from [facility]. Please contact [facility] at [phone] with questions or concerns regarding your invoice.   IF you received labwork today, you will receive an invoice from [place]. Please contact [facility] at [phone] with questions or concerns regarding your invoice.   Our billing staff will not be able to assist you with questions regarding bills from these companies.  You will be contacted with the lab results as soon as they are available. The fastest way to get your results is to activate your My Chart account. Instructions are located on the last page of this paperwork. If you have not heard from [place] regarding the results in 2 weeks, please contact this office.      Corns and Calluses Introduction Corns are small areas of thickened skin that occur on the top, sides, or tip of a toe. They contain a cone-shaped core with a point that can press on a nerve below. This causes pain. Calluses are areas of thickened skin that can occur anywhere on the body including hands, fingers, palms, soles of the feet, and heels.Calluses are usually larger than corns. What are the causes? Corns and calluses are caused by rubbing (friction) or pressure, such as from shoes that are too tight or do not fit properly. What increases the risk? Corns are more likely to develop in people who have toe deformities, such as hammer toes. Since calluses can occur with friction to any area of the skin, calluses are more likely to develop in people who:  Work with their hands.  Wear shoes that fit poorly, shoes that are too tight, or shoes that are high-heeled.  Have  toes deformities. What are the signs or symptoms? Symptoms of a corn or callus include:  A hard growth on the skin.  Pain or tenderness under the skin.  Redness and swelling.  Increased discomfort while wearing tight-fitting shoes. How is this diagnosed? Corns and calluses may be diagnosed with a medical history and physical exam. How is this treated? Corns and calluses may be treated with:  Removing the cause of the friction or pressure. This may include:  Changing your shoes.  Wearing shoe inserts (orthotics) or other protective layers in your shoes, such as a corn pad.  Wearing gloves.  Medicines to help soften skin in the hardened, thickened areas.  Reducing the size of the corn or callus by removing the dead layers of skin.  Antibiotic medicines to treat infection.  Surgery, if a toe deformity is the cause. Follow these instructions at home:  Take medicines only as directed by your health care provider.  If you were prescribed an antibiotic, finish all of it even if you start to feel better.  Wear shoes that fit well. Avoid wearing high-heeled shoes and shoes that are too tight or too loose.  Wear any padding, protective layers, gloves, or orthotics as directed by your health care provider.  Soak your hands or feet and then use a file or pumice stone to soften your corn or callus. Do this as directed by your health care provider.  Check your corn or callus every day for signs of infection. Watch for:  Redness, swelling, or pain.  Fluid, blood, or pus. Contact a health care provider if:  Your symptoms do not improve with treatment.  You have increased redness, swelling, or pain at the site of your corn or callus.  You have fluid, blood, or pus coming from your corn or callus.  You have new symptoms. This information is not intended to replace advice given to you by your health care provider. Make sure you discuss any questions you have with your health  care provider. Document Released: 03/25/2004 Document Revised: 01/07/2016 Document Reviewed: 06/15/2014  2017 Elsevier

## 2016-06-19 NOTE — Progress Notes (Signed)
Chief Complaint  Patient presents with  . Toe Pain    right toe/blister  . Headache  . Sinusitis    HPI  Right toe pain- Corn Patient states that she has had a painful 5th toe on the right foot that got acutely worse in the past 2 weeks but has been growing a corn for years. She states that now she is limping and having pain.  Headache Pt reports that she has been having headache pain for the past 4 days  Pain is in the frontal sinus area as well as the back of the head and neck She reports pain at night that wakes her from her sleep. She states that she has an intermittent runny nose and cough. She reports that she typically gets headache in the whole head and now it seems worse. She reports that she has normal vision.  Hypertension Pt reports that she is compliant with her bp meds and tries to avoid salt No cp, palpitations, sob No medication side effects BP Readings from Last 3 Encounters:  06/19/16 122/74  04/05/16 128/80  04/03/16 120/86     Past Medical History:  Diagnosis Date  . Anemia   . HTN (hypertension)   . Hx of cardiovascular stress test    a. ETT-Myoview 05/28/12: Low risk study with a small, mild fixed basal inferior perfusion defect most likely representing soft tissue attenuation with inferolateral HK (wall motion abnormality does not correlate with inferior perfusion defect), EF 49%.  Marland Kitchen Hx of echocardiogram    a. Echocardiogram 06/11/12: EF 50-55%, normal wall motion, grade 1 diastolic dysfunction, trivial AI, mild MR, mild LAE, mild RAE.  . RUQ pain     Current Outpatient Prescriptions  Medication Sig Dispense Refill  . losartan (COZAAR) 100 MG tablet Take 1 tablet (100 mg total) by mouth daily. 90 tablet 3  . losartan (COZAAR) 100 MG tablet TAKE 1 TABLET(100 MG) BY MOUTH DAILY (Patient not taking: Reported on 06/19/2016) 30 tablet 2   No current facility-administered medications for this visit.     Allergies:  Allergies  Allergen Reactions    . Hctz [Hydrochlorothiazide]     Heart palpitations    History reviewed. No pertinent surgical history.  Social History   Social History  . Marital status: Married    Spouse name: N/A  . Number of children: 4  . Years of education: N/A   Occupational History  . Housekeeper     at hotel  .  [employer]   Social History Main Topics  . Smoking status: Never Smoker  . Smokeless tobacco: Never Used  . Alcohol use No  . Drug use: No  . Sexual activity: No   Other Topics Concern  . None   Social History Narrative  . None    ROS  Objective: Vitals:   06/19/16 1356  BP: 122/74  Pulse: 98  Resp: 18  Temp: 99.4 F (37.4 C)  TempSrc: Oral  SpO2: 100%  Weight: 229 lb (103.9 kg)  Height: 5\' 4"  (1.626 m)    Physical Exam General: alert, oriented, in NAD Head: normocephalic, atraumatic, no sinus tenderness Eyes: EOM intact, no scleral icterus or conjunctival injection Ears: TM clear bilaterally Throat: no pharyngeal exudate or erythema Lymph: no posterior auricular, submental or cervical lymph adenopathy Heart: normal rate, normal sinus rhythm, no murmurs Lungs: clear to auscultation bilaterally, no wheezing Foot: large corn on the right small toe, normal DP and PT    Assessment and Plan [name] [name]  was seen today for toe pain, headache and sinusitis.  Diagnoses and all orders for this visit:  Essential hypertension- bp in good range, continue present medications  Corn or callus- discussed that she should follow up with Podiatry for treatment  Sinus congestion Sinus headache - discussed antihistamines - discussed that if she is able to reduce congestion then her headache might improve  Muscle tension headache- advised aspercreme with lidocaine and stretches as well as ibuprofen 800mg       [name] [name]

## 2016-08-19 ENCOUNTER — Ambulatory Visit (INDEPENDENT_AMBULATORY_CARE_PROVIDER_SITE_OTHER): Payer: 59 | Admitting: Family Medicine

## 2016-08-19 VITALS — BP 138/90 | HR 71 | Temp 98.5°F | Resp 16 | Ht 64.0 in | Wt 224.0 lb

## 2016-08-19 DIAGNOSIS — R911 Solitary pulmonary nodule: Secondary | ICD-10-CM | POA: Diagnosis not present

## 2016-08-19 DIAGNOSIS — R5383 Other fatigue: Secondary | ICD-10-CM

## 2016-08-19 DIAGNOSIS — Z131 Encounter for screening for diabetes mellitus: Secondary | ICD-10-CM | POA: Diagnosis not present

## 2016-08-19 DIAGNOSIS — R109 Unspecified abdominal pain: Secondary | ICD-10-CM | POA: Diagnosis not present

## 2016-08-19 DIAGNOSIS — D649 Anemia, unspecified: Secondary | ICD-10-CM

## 2016-08-19 DIAGNOSIS — R1011 Right upper quadrant pain: Secondary | ICD-10-CM | POA: Diagnosis not present

## 2016-08-19 LAB — POCT URINALYSIS DIP (MANUAL ENTRY)
BILIRUBIN UA: NEGATIVE
Glucose, UA: NEGATIVE
Ketones, POC UA: NEGATIVE
Nitrite, UA: NEGATIVE
PH UA: 7.5
Protein Ur, POC: NEGATIVE
RBC UA: NEGATIVE
SPEC GRAV UA: 1.015
UROBILINOGEN UA: 0.2

## 2016-08-19 NOTE — Patient Instructions (Addendum)
  You will be notified regarding your lab results.  Please return stool cards at your earliest convenience.  Dizziness, change position slowly and increase water intake daily.   IF you received an x-ray today, you will receive an invoice from [facility]. Please contact [facility] at [phone] with questions or concerns regarding your invoice.   IF you received labwork today, you will receive an invoice from [place]. Please contact [facility] at [phone] with questions or concerns regarding your invoice.   Our billing staff will not be able to assist you with questions regarding bills from these companies.  You will be contacted with the lab results as soon as they are available. The fastest way to get your results is to activate your My Chart account. Instructions are located on the last page of this paperwork. If you have not heard from [place] regarding the results in 2 weeks, please contact this office.

## 2016-08-19 NOTE — Progress Notes (Signed)
Patient ID: [name] [name], female    DOB: [birth year], 46 y.o.   MRN: [id number]  PCP: No primary care provider on file.  Chief Complaint  Patient presents with  . Abdominal Pain  . Flank Pain  . Dizziness    Subjective:  HPI 46 year old female presents for evaluation of abdominal pain, flank pain, and dizziness x 2 weeks. Past medical hx includes: Hypertension, Diverticulosis, hx of H. Pylori, and GERD Pt reports abdominal pain,specifically in the RUQ has persisted for more than 2 years.  The pain is not constant, although is occurs sporadically x during the coarse of the week. She reports having an ultrasound, CT scan, and evaluation by GI without a definitive cause for RUQ pain. Characterizes pain as an aching sensation. She has had a positive hemoccult test in April 2017 and was told to follow-up and with a repeat test by Dr. Linna Darner.  Explains over the last two weeks, she has experienced fatigue, with some episodes of feeling light headed and right mid flank pain in addition to her persistent RUQ abdominal pain. Denies dysuria, frequency, or changes in odor of urine.                                                                                                                                                               Social History   Social History  . Marital status: Married    Spouse name: N/A  . Number of children: 4  . Years of education: N/A   Occupational History  . Housekeeper     at hotel  .  [employer]   Social History Main Topics  . Smoking status: Never Smoker  . Smokeless tobacco: Never Used  . Alcohol use No  . Drug use: No  . Sexual activity: No   Other Topics Concern  . Not on file   Social History Narrative  . No narrative on file    Family History  Problem Relation Age of Onset  . Coronary artery disease Mother   . Other Brother     fluid around heart  . Other Other     hepatic issues-? maternal side   Review of Systems  See  HPI  Patient Active Problem List   Diagnosis Date Noted  . PVC (premature ventricular contraction) 05/26/2014  . Diverticulosis 04/14/2014  . GERD (gastroesophageal reflux disease) 11/19/2012  . Chest pain 05/21/2012  . Obesity, morbid (more than 100 lbs over ideal weight or BMI > 40) ([place]) 05/30/2011  . Essential hypertension 04/29/2009    Allergies  Allergen Reactions  . Hctz [Hydrochlorothiazide]     Heart palpitations    Prior to Admission medications   Medication Sig Start Date End Date Taking? Authorizing Provider  losartan (COZAAR) 100 MG  tablet Take 1 tablet (100 mg total) by mouth daily. 04/03/16  Yes Posey Boyer, MD  fluticasone (FLONASE) 50 MCG/ACT nasal spray Place 1 spray into both nostrils daily. For sinus congestion Patient not taking: Reported on 08/19/2016 06/19/16   Forrest Moron, MD  ibuprofen (ADVIL,MOTRIN) 800 MG tablet Take 1 tablet (800 mg total) by mouth every 8 (eight) hours as needed. Patient not taking: Reported on 08/19/2016 06/19/16   Forrest Moron, MD  lidocaine (ASPERCREME W/LIDOCAINE) 4 % cream Apply 1 application topically daily as needed. Apply to neck and shoulders to help muscle tension Patient not taking: Reported on 08/19/2016 06/19/16   Forrest Moron, MD    Past Medical, Surgical Family and Social History reviewed and updated.    Objective:   Today's Vitals   08/19/16 1411  BP: 138/90  Pulse: 71  Resp: 16  Temp: 98.5 F (36.9 C)  TempSrc: Oral  SpO2: 100%  Weight: 224 lb (101.6 kg)  Height: 5\' 4"  (1.626 m)    Wt Readings from Last 3 Encounters:  08/19/16 224 lb (101.6 kg)  06/19/16 229 lb (103.9 kg)  04/05/16 224 lb (101.6 kg)    Physical Exam  Constitutional: She is oriented to person, place, and time. She appears well-developed and well-nourished.  HENT:  Head: Normocephalic and atraumatic.  Nose: Nose normal.  Eyes: Conjunctivae and EOM are normal. Pupils are equal, round, and reactive to light.  Neck: Normal  range of motion. Neck supple. No thyromegaly present.  Cardiovascular: Normal rate, regular rhythm, normal heart sounds and intact distal pulses.   Pulmonary/Chest: Effort normal and breath sounds normal.  Abdominal: Soft. Bowel sounds are normal. She exhibits no distension and no mass. There is no splenomegaly or hepatomegaly. There is no rebound, no guarding and no CVA tenderness.  Musculoskeletal: Normal range of motion.  Lymphadenopathy:    She has no cervical adenopathy.  Neurological: She is alert and oriented to person, place, and time. She has normal strength. She displays a negative Romberg sign. GCS eye subscore is 4. GCS verbal subscore is 5. GCS motor subscore is 6.      Assessment & Plan:  1. Flank pain 2. Right upper quadrant abdominal pain 3. Fatigue, unspecified type 4. Screening for diabetes mellitus 5. Anemia, unspecified type 6. Lung nodule  Plan: UA negative and culture pending. Checking stool for evidence of bleeding. Will consider colonoscopy if hemoccult is positive. Screening for metabolic causes for fatigue.    Will follow-up with lab results.  See phone note 08/22/16 re: -CT Abdomen Pelvis w/Contrast  -Positive urine culture Ampicillin 500 mg x 4 times daily -Abnormal Vitamin D- Ergocalciferol  50,000 units once weekly x 12 weeks.  Complete prescribed therapy. You will be notified once CT scan results are received.   Carroll Sage. Kenton Kingfisher, MSN, FNP-C [facility] at [place]

## 2016-08-20 LAB — CBC WITH DIFFERENTIAL/PLATELET
Basophils Absolute: 0 10*3/uL (ref 0.0–0.2)
Basos: 0 %
EOS (ABSOLUTE): 0 10*3/uL (ref 0.0–0.4)
Eos: 1 %
HEMATOCRIT: 33 % — AB (ref 34.0–46.6)
HEMOGLOBIN: 10.2 g/dL — AB (ref 11.1–15.9)
Immature Grans (Abs): 0 10*3/uL (ref 0.0–0.1)
Immature Granulocytes: 0 %
LYMPHS ABS: 1.7 10*3/uL (ref 0.7–3.1)
Lymphs: 44 %
MCH: 26 pg — ABNORMAL LOW (ref 26.6–33.0)
MCHC: 30.9 g/dL — AB (ref 31.5–35.7)
MCV: 84 fL (ref 79–97)
MONOCYTES: 11 %
Monocytes Absolute: 0.4 10*3/uL (ref 0.1–0.9)
Neutrophils Absolute: 1.7 10*3/uL (ref 1.4–7.0)
Neutrophils: 44 %
Platelets: 230 10*3/uL (ref 150–379)
RBC: 3.92 x10E6/uL (ref 3.77–5.28)
RDW: 14.7 % (ref 12.3–15.4)
WBC: 3.8 10*3/uL (ref 3.4–10.8)

## 2016-08-20 LAB — CMP14+EGFR
ALBUMIN: 4.2 g/dL (ref 3.5–5.5)
ALK PHOS: 58 IU/L (ref 39–117)
ALT: 18 IU/L (ref 0–32)
AST: 16 IU/L (ref 0–40)
Albumin/Globulin Ratio: 1.4 (ref 1.2–2.2)
BUN / CREAT RATIO: 13 (ref 9–23)
BUN: 9 mg/dL (ref 6–24)
Bilirubin Total: 0.3 mg/dL (ref 0.0–1.2)
CO2: 23 mmol/L (ref 18–29)
CREATININE: 0.69 mg/dL (ref 0.57–1.00)
Calcium: 9.4 mg/dL (ref 8.7–10.2)
Chloride: 100 mmol/L (ref 96–106)
GFR calc non Af Amer: 104 mL/min/{1.73_m2} (ref >59)
GFR, EST AFRICAN AMERICAN: 120 mL/min/{1.73_m2} (ref >59)
GLOBULIN, TOTAL: 3.1 g/dL (ref 1.5–4.5)
Glucose: 77 mg/dL (ref 65–99)
Potassium: 4.3 mmol/L (ref 3.5–5.2)
SODIUM: 137 mmol/L (ref 134–144)
TOTAL PROTEIN: 7.3 g/dL (ref 6.0–8.5)

## 2016-08-20 LAB — THYROID PANEL WITH TSH
Free Thyroxine Index: 1.3 (ref 1.2–4.9)
T3 Uptake Ratio: 21 % — ABNORMAL LOW (ref 24–39)
T4 TOTAL: 6.2 ug/dL (ref 4.5–12.0)
TSH: 1.38 u[IU]/mL (ref 0.450–4.500)

## 2016-08-20 LAB — HEMOGLOBIN A1C
Est. average glucose Bld gHb Est-mCnc: 97 mg/dL
Hgb A1c MFr Bld: 5 % (ref 4.8–5.6)

## 2016-08-20 LAB — VITAMIN D 25 HYDROXY (VIT D DEFICIENCY, FRACTURES): Vit D, 25-Hydroxy: 7.9 ng/mL — ABNORMAL LOW (ref 30.0–100.0)

## 2016-08-20 LAB — FERRITIN: Ferritin: 11 ng/mL — ABNORMAL LOW (ref 15–150)

## 2016-08-21 LAB — URINE CULTURE

## 2016-08-22 ENCOUNTER — Telehealth: Payer: Self-pay | Admitting: Family Medicine

## 2016-08-22 MED ORDER — AMPICILLIN 500 MG PO CAPS: 500.0000 mg | ORAL_CAPSULE | Freq: Four times a day (QID) | ORAL | 0 refills | Status: DC

## 2016-08-22 MED ORDER — FERROUS SULFATE 325 (65 FE) MG PO TABS: 325.0000 mg | ORAL_TABLET | Freq: Every day | ORAL | 3 refills | Status: DC

## 2016-08-22 MED ORDER — VITAMIN D (ERGOCALCIFEROL) 1.25 MG (50000 UNIT) PO CAPS: 50000.0000 [IU] | ORAL_CAPSULE | ORAL | 4 refills | Status: DC

## 2016-08-22 NOTE — Telephone Encounter (Signed)
Reviewed all abnormal labs with patient and indications for treatment.  Results for orders placed or performed in visit on 08/19/16  Urine culture  Result Value Ref Range   Urine Culture, Routine Final report (A)    Urine Culture result 1 Comment (A)    RESULT 2 Comment   Thyroid Panel With TSH  Result Value Ref Range   TSH 1.380 0.450 - 4.500 uIU/mL   T4, Total 6.2 4.5 - 12.0 ug/dL   T3 Uptake Ratio 21 (L) 24 - 39 %   Free Thyroxine Index 1.3 1.2 - 4.9  Vitamin D, 25-hydroxy  Result Value Ref Range   Vit D, 25-Hydroxy 7.9 (L) 30.0 - 100.0 ng/mL  CBC with Differential/Platelet  Result Value Ref Range   WBC 3.8 3.4 - 10.8 x10E3/uL   RBC 3.92 3.77 - 5.28 x10E6/uL   Hemoglobin 10.2 (L) 11.1 - 15.9 g/dL   Hematocrit 33.0 (L) 34.0 - 46.6 %   MCV 84 79 - 97 fL   MCH 26.0 (L) 26.6 - 33.0 pg   MCHC 30.9 (L) 31.5 - 35.7 g/dL   RDW 14.7 12.3 - 15.4 %   Platelets 230 150 - 379 x10E3/uL   Neutrophils 44 Not Estab. %   Lymphs 44 Not Estab. %   Monocytes 11 Not Estab. %   Eos 1 Not Estab. %   Basos 0 Not Estab. %   Neutrophils Absolute 1.7 1.4 - 7.0 x10E3/uL   Lymphocytes Absolute 1.7 0.7 - 3.1 x10E3/uL   Monocytes Absolute 0.4 0.1 - 0.9 x10E3/uL   EOS (ABSOLUTE) 0.0 0.0 - 0.4 x10E3/uL   Basophils Absolute 0.0 0.0 - 0.2 x10E3/uL   Immature Granulocytes 0 Not Estab. %   Immature Grans (Abs) 0.0 0.0 - 0.1 x10E3/uL  CMP14+EGFR  Result Value Ref Range   Glucose 77 65 - 99 mg/dL   BUN 9 6 - 24 mg/dL   Creatinine, Ser 0.69 0.57 - 1.00 mg/dL   GFR calc non Af Amer 104 >59 mL/min/1.73   GFR calc Af Amer 120 >59 mL/min/1.73   BUN/Creatinine Ratio 13 9 - 23   Sodium 137 134 - 144 mmol/L   Potassium 4.3 3.5 - 5.2 mmol/L   Chloride 100 96 - 106 mmol/L   CO2 23 18 - 29 mmol/L   Calcium 9.4 8.7 - 10.2 mg/dL   Total Protein 7.3 6.0 - 8.5 g/dL   Albumin 4.2 3.5 - 5.5 g/dL   Globulin, Total 3.1 1.5 - 4.5 g/dL   Albumin/Globulin Ratio 1.4 1.2 - 2.2   Bilirubin Total 0.3 0.0 - 1.2 mg/dL   Alkaline Phosphatase 58 39 - 117 IU/L   AST 16 0 - 40 IU/L   ALT 18 0 - 32 IU/L  Ferritin  Result Value Ref Range   Ferritin 11 (L) 15 - 150 ng/mL  Hemoglobin A1c  Result Value Ref Range   Hgb A1c MFr Bld 5.0 4.8 - 5.6 %   Est. average glucose Bld gHb Est-mCnc 97 mg/dL  POCT urinalysis dipstick  Result Value Ref Range   Color, UA yellow yellow   Clarity, UA clear clear   Glucose, UA negative negative   Bilirubin, UA negative negative   Ketones, POC UA negative negative   Spec Grav, UA 1.015    Blood, UA negative negative   pH, UA 7.5    Protein Ur, POC negative negative   Urobilinogen, UA 0.2    Nitrite, UA Negative Negative   Leukocytes, UA small (1+) (A)  Negative   Ordered a repeat CT scan as patient reports persisted RUQ pain, prior study with ultrasound showed possible hepatic disease and prior CT 2015 revealed small lung nodule.  Advised patient that she will be contacted to schedule CT.  Carroll Sage. Kenton Kingfisher, MSN, FNP-C [facility] at [place]

## 2016-08-26 ENCOUNTER — Telehealth: Payer: Self-pay | Admitting: Family Medicine

## 2016-08-26 DIAGNOSIS — R911 Solitary pulmonary nodule: Secondary | ICD-10-CM | POA: Insufficient documentation

## 2016-08-26 NOTE — Telephone Encounter (Signed)
Addendum to previous phone note 08/22/16: patient was communicated the following info and medications sent to pharmacy    Urine culture- positive for Strep B start Ampicillin 500 mg 4 times daily x 7 days.  Vitamin D Deficiency-start Ergocalciferol 50,000 units once weekly x 12 weeks   CT Abdomen Pelvis with Contrast -2015 4 mm nodule noted on right lung lobe. Patient is still having persisted RUQ abdominal pain.

## 2016-08-26 NOTE — Telephone Encounter (Signed)
erronious

## 2016-08-29 ENCOUNTER — Ambulatory Visit (INDEPENDENT_AMBULATORY_CARE_PROVIDER_SITE_OTHER): Payer: 59 | Admitting: Family Medicine

## 2016-08-29 ENCOUNTER — Telehealth: Payer: Self-pay

## 2016-08-29 VITALS — BP 108/76 | HR 76 | Temp 98.6°F | Resp 16 | Ht 64.0 in | Wt 221.4 lb

## 2016-08-29 DIAGNOSIS — G8929 Other chronic pain: Secondary | ICD-10-CM

## 2016-08-29 DIAGNOSIS — R1011 Right upper quadrant pain: Secondary | ICD-10-CM | POA: Diagnosis not present

## 2016-08-29 DIAGNOSIS — R140 Abdominal distension (gaseous): Secondary | ICD-10-CM | POA: Diagnosis not present

## 2016-08-29 MED ORDER — RANITIDINE HCL 150 MG PO TABS: 150.0000 mg | ORAL_TABLET | Freq: Every day | ORAL | 1 refills | Status: DC

## 2016-08-29 NOTE — Patient Instructions (Addendum)
Purchase over the counter Miralax to relieve bowel burden 17 grams per day in 8 oz of water drink until you experience at least 5 bulky stools.  Two days incorporate a clear liquid diet while you are clearing your colon.  For acute abdominal discomfort and bloating take Ranitidine 150 mg once to twice daily as needed.  I am referring your to Gastroenterology.  I will forward the information your insurance company is requesting for pre-authorization for your CT scan.      IF you received an x-ray today, you will receive an invoice from [facility]. Please contact [facility] at [phone] with questions or concerns regarding your invoice.   IF you received labwork today, you will receive an invoice from [facility]. Please contact [facility] at [phone] with questions or concerns regarding your invoice.   Our billing staff will not be able to assist you with questions regarding bills from these companies.  You will be contacted with the lab results as soon as they are available. The fastest way to get your results is to activate your My Chart account. Instructions are located on the last page of this paperwork. If you have not heard from [place] regarding the results in 2 weeks, please contact this office.     Ascites Ascites is a collection of excess fluid in the abdomen. Ascites can range from mild to severe. It can get worse without treatment. What are the causes? Possible causes include:  Cirrhosis. This is the most common cause of ascites.  Infection or inflammation in the abdomen.  Cancer in the abdomen.  Heart failure.  Kidney disease.  Inflammation of the pancreas.  Clots in the veins of the liver. What are the signs or symptoms? Signs and symptoms may include:  A feeling of fullness in your abdomen. This is common.  An increase in the size of your abdomen or your waist.  Swelling in your legs.  Swelling of the scrotum in men.  Difficulty  breathing.  Abdominal pain.  Sudden weight gain. If the condition is mild, you may not have symptoms. How is this diagnosed? To make a diagnosis, your health care provider will:  Ask about your medical history.  Perform a physical exam.  Order imaging tests, such as an ultrasound or CT scan of your abdomen. How is this treated? Treatment depends on the cause of the ascites. It may include:  Taking a pill to make you urinate. This is called a water pill (diuretic pill).  Strictly reducing your salt (sodium) intake. Salt can cause extra fluid to be kept in the body, and this makes ascites worse.  Having a procedure to remove fluid from your abdomen (paracentesis).  Having a procedure to transfer fluid from your abdomen into a vein.  Having a procedure that connects two of the major veins within your liver and relieves pressure on your liver (TIPS procedure). Ascites may go away or improve with treatment of the condition that caused it. Follow these instructions at home:  Keep track of your weight. To do this, weigh yourself at the same time every day and record your weight.  Keep track of how much you drink and any changes in the amount you urinate.  Follow any instructions that your health care provider gives you about how much to drink.  Try not to eat salty (high-sodium) foods.  Take medicines only as directed by your health care provider.  Keep all follow-up visits as directed by your health care provider. This  is important.  Report any changes in your health to your health care provider, especially if you develop new symptoms or your symptoms get worse. Contact a health care provider if:  Your gain more than 3 pounds in 3 days.  Your abdominal size or your waist size increases.  You have new swelling in your legs.  The swelling in your legs gets worse. Get help right away if:  You develop a fever.  You develop confusion.  You develop new or worsening  difficulty breathing.  You develop new or worsening abdominal pain.  You develop new or worsening swelling in the scrotum (in men). This information is not intended to replace advice given to you by your health care provider. Make sure you discuss any questions you have with your health care provider. Document Released: 06/19/2005 Document Revised: 10/27/2015 Document Reviewed: 01/16/2014 Elsevier Interactive Patient Education  2017 [employer] Liquid Diet A clear liquid diet means that you only have liquids that you can see through. You do not eat any food on this diet. Most people need to follow this diet for only a short time. What do I need to know about this diet?  A clear liquid is a liquid that you can see through when you hold it up to a light.  This diet does not give you all the nutrients that you need. Choose a variety of the liquids that your doctor says you can drink on this diet. That way, you will get as many nutrients as possible.  If you are not sure whether you can have certain items, ask your doctor. What can I have?  Water and flavored water.  Fruit juices that do not have pulp, such as cranberry juice and apple juice.  Tea and coffee without milk or cream.  Clear bouillon or broth.  Broth-based soups that have been strained.  Flavored gelatins.  Honey.  Sugar water.  Frozen ice or frozen ice pops that do not have any milk, yogurt, fruit pieces, or fruit pulp in them.  Clear sodas.  Clear sports drinks. The items listed above may not be a complete list of recommended liquids. Contact your food and nutrition expert (dietitian) for more options.  What can I not have?  Juices that have pulp.  Milk.  Cream or cream-based soups.  Yogurt. The items listed above may not be a complete list of liquids to avoid. Contact your food and nutrition expert for more information.  Summary  A clear liquid diet is a diet that includes only liquids that  you can see through.  The goal of this diet is to help you recover.  Make sure to avoid liquids with milk, cream, or pulp while you are on this diet. This information is not intended to replace advice given to you by your health care provider. Make sure you discuss any questions you have with your health care provider. Document Released: 06/01/2008 Document Revised: 01/31/2016 Document Reviewed: 05/16/2013 Elsevier Interactive Patient Education  2017 [employer].

## 2016-08-29 NOTE — Telephone Encounter (Signed)
Ranitidine needs PA  [phone] Id [id number]

## 2016-08-29 NOTE — Progress Notes (Addendum)
Patient ID: [name] [name], female    DOB: [birth year], 46 y.o.   MRN: [id number]  PCP: No primary care provider on file.  Chief Complaint  Patient presents with  . Abdominal Pain    in rt side of abdomen x 1 week, some diarrhea, feels bloated    Subjective:  HPI 46 year old female presents for evaluation of abdominal pain. Patient has a long history of RUQ abdominal pain in which she has received diagnostic imaging and work-up by gastroenterology in the past and findings were consistently unremarkable.  With the exception of CT scan in 2015 revealed a right lower lung nodule. She presented to Primary Care at [facility] 08/19/16 for RUQ and flank pain. Subsequently, she was diagnosed and treated for UTI. She continued to complain of chronic RUQ a CT Scan which is currently pending approval by her insurance provider has been ordered. Today she complains of RUQ and RLQ pain, feels that the right middle portion of her abdomen is enlarged.  Feels enlarged hardness middle right upper quadrant. Diarrhea on and off 2 weeks. Reports pain present upper anterior right thorax below her bra line.No pain on the left side of abdomen. Appetite is normal. Over the last couples days noticed significant persistent bloating accompanied by increased flatus.    Social History   Social History  . Marital status: Married    Spouse name: N/A  . Number of children: 4  . Years of education: N/A   Occupational History  . Housekeeper     at hotel  .  [employer]   Social History Main Topics  . Smoking status: Never Smoker  . Smokeless tobacco: Never Used  . Alcohol use No  . Drug use: No  . Sexual activity: No   Other Topics Concern  . Not on file   Social History Narrative  . No narrative on file    Family History  Problem Relation Age of Onset  . Coronary artery disease Mother   . Other Brother     fluid around heart  . Other Other     hepatic issues-? maternal side   Review of Systems    See HPI  Patient Active Problem List   Diagnosis Date Noted  . Lung nodule 08/26/2016  . PVC (premature ventricular contraction) 05/26/2014  . Diverticulosis 04/14/2014  . GERD (gastroesophageal reflux disease) 11/19/2012  . Chest pain 05/21/2012  . Obesity, morbid (more than 100 lbs over ideal weight or BMI > 40) ([place]) 05/30/2011  . Essential hypertension 04/29/2009    Allergies  Allergen Reactions  . Hctz [Hydrochlorothiazide]     Heart palpitations    Prior to Admission medications   Medication Sig Start Date End Date Taking? Authorizing Provider  ampicillin (PRINCIPEN) 500 MG capsule Take 1 capsule (500 mg total) by mouth 4 (four) times daily. 08/22/16  Yes Sedalia Muta, FNP  ferrous sulfate 325 (65 FE) MG tablet Take 1 tablet (325 mg total) by mouth daily with breakfast. 08/22/16  Yes Sedalia Muta, FNP  fluticasone [facility]) 50 MCG/ACT nasal spray Place 1 spray into both nostrils daily. For sinus congestion 06/19/16  Yes [name], MD  losartan (COZAAR) 100 MG tablet Take 1 tablet (100 mg total) by mouth daily. 04/03/16  Yes Posey Boyer, MD  Vitamin D, Ergocalciferol, (DRISDOL) 50000 units CAPS capsule Take 1 capsule (50,000 Units total) by mouth every 7 (seven) days. 08/22/16  Yes Sedalia Muta, FNP  ibuprofen (ADVIL,MOTRIN) 800 MG  tablet Take 1 tablet (800 mg total) by mouth every 8 (eight) hours as needed. Patient not taking: Reported on 08/19/2016 06/19/16   Forrest Moron, MD  lidocaine (ASPERCREME W/LIDOCAINE) 4 % cream Apply 1 application topically daily as needed. Apply to neck and shoulders to help muscle tension Patient not taking: Reported on 08/19/2016 06/19/16   Forrest Moron, MD    Past Medical, Surgical Family and Social History reviewed and updated.    Objective:   Today's Vitals   08/29/16 1020  BP: 108/76  Pulse: 76  Resp: 16  Temp: 98.6 F (37 C)  TempSrc: Oral  SpO2: 100%  Weight: 221 lb 6.4 oz (100.4  kg)  Height: 5\' 4"  (1.626 m)    Wt Readings from Last 3 Encounters:  08/29/16 221 lb 6.4 oz (100.4 kg)  08/19/16 224 lb (101.6 kg)  06/19/16 229 lb (103.9 kg)   Physical Exam  Constitutional: She is oriented to person, place, and time. She appears well-developed and well-nourished.  HENT:  Head: Normocephalic and atraumatic.  Right Ear: External ear normal.  Left Ear: External ear normal.  Nose: Nose normal.  Mouth/Throat: Oropharynx is clear and moist.  Eyes: Conjunctivae are normal. Pupils are equal, round, and reactive to light.  Neck: Normal range of motion. Neck supple.  Cardiovascular: Normal rate, regular rhythm, normal heart sounds and intact distal pulses.   Pulmonary/Chest: Effort normal and breath sounds normal.  Abdominal: Normal appearance and normal aorta. She exhibits distension and ascites. Bowel sounds are increased. There is no hepatosplenomegaly. There is generalized tenderness. There is no CVA tenderness.  Musculoskeletal: Normal range of motion.  Neurological: She is alert and oriented to person, place, and time.  Skin: Skin is warm and dry.  Psychiatric: She has a normal mood and affect. Her behavior is normal. Judgment and thought content normal.     Assessment & Plan:  1. Chronic RUQ pain 2. Abdominal bloating  Plan: Purchase over the counter Miralax to relieve bowel burden 17 grams per day in 8 oz of water drink until you experience at least 5 bulky stools.  Two days incorporate a clear liquid diet while you are clearing your colon.  For acute abdominal discomfort and bloating take Ranitidine 150 mg once to twice daily as needed.  I am referring your to Gastroenterology for additional work-up.  I will forward the information your insurance company is requesting for pre-authorization for your CT scan.    Carroll Sage. Kenton Kingfisher, MSN, FNP-C Primary [facility] at [place]

## 2016-08-30 NOTE — Telephone Encounter (Signed)
PA started with insurance company.  We will receive a fax with approval or denial in 48-72 hrs.  Patient will receive a call from the insurance company.

## 2016-09-01 NOTE — Telephone Encounter (Signed)
Routed to the clinical message pool

## 2016-09-13 ENCOUNTER — Telehealth: Payer: Self-pay | Admitting: Family Medicine

## 2016-09-13 MED ORDER — OMEPRAZOLE 40 MG PO CPDR: 40.0000 mg | DELAYED_RELEASE_CAPSULE | Freq: Every day | ORAL | 3 refills | Status: DC

## 2016-09-13 NOTE — Telephone Encounter (Signed)
I've changed medication to omeprazole 40 mg daily in hopes of having this medication covered. Will call patient to advise.

## 2016-09-13 NOTE — Addendum Note (Signed)
Addended by: Scot Jun on: 09/13/2016 03:08 PM   Modules accepted: Orders

## 2016-09-13 NOTE — Telephone Encounter (Signed)
[name] [name],  I ordered a CT of abdomen several weeks ago and her insurance sent her a form requesting documentation for necessity of imaging. Could you or whoever typically handles,  please send all of my progress notes and as well a copy of the results of her last CT in 2015 to her insurance company I have a copy of the letter from her insurance company.  Carroll Sage. Kenton Kingfisher, MSN, FNP-C Primary Care at [place]

## 2016-09-13 NOTE — Telephone Encounter (Signed)
It looks like we have received authorization and are waiting on [facility] to schedule. I will follow up with them to contact pt for the procedure.

## 2016-09-13 NOTE — Telephone Encounter (Signed)
Received denial on Rantididine today.  Called insurance company to verify what medications were covered.  He told me that the only thing covered under pt's policy is Rantididine syrup 15 mg.

## 2016-09-13 NOTE — Telephone Encounter (Signed)
Spoke with husband about scheduling CT scan. It has been authorized by ins. Provided him with [facility]'s number of [phone] to schedule.

## 2016-09-19 ENCOUNTER — Ambulatory Visit
Admission: RE | Admit: 2016-09-19 | Discharge: 2016-09-19 | Disposition: A | Discharge status: Home / Self Care | Payer: 59 | Source: Ambulatory Visit | Attending: Family Medicine | Admitting: Family Medicine

## 2016-09-19 DIAGNOSIS — R1031 Right lower quadrant pain: Secondary | ICD-10-CM | POA: Diagnosis not present

## 2016-09-19 DIAGNOSIS — R1011 Right upper quadrant pain: Secondary | ICD-10-CM

## 2016-09-19 MED ORDER — IOPAMIDOL (ISOVUE-300) INJECTION 61%
100.0000 mL | Freq: Once | INTRAVENOUS | Status: AC | PRN
  Administered 2016-09-19: 100 mL via INTRAVENOUS

## 2016-10-05 ENCOUNTER — Encounter: Payer: Self-pay | Admitting: *Deleted

## 2016-10-05 ENCOUNTER — Telehealth: Payer: Self-pay | Admitting: Family Medicine

## 2016-10-05 DIAGNOSIS — N83201 Unspecified ovarian cyst, right side: Secondary | ICD-10-CM

## 2016-10-05 NOTE — Telephone Encounter (Signed)
Pt's husband calling in regards to referral for pt. Pt was seen by Molli Barrows and was ordered a CT and it was found that one of her ovaries was swollen. Pt waiting on a referral to a specialist but has not heard back. I did not see a referral placed for pt regarding this. Pt also has another procedure scheduled on April 11th and husband was wondering if the change in provider affects this. He thinks it has to do with digestion. Please advise. Husband is on DPR. Husband contact number is [phone].

## 2016-10-06 NOTE — Telephone Encounter (Signed)
See ct result notes, no referral placed? Dr Nolon Rod out of office, kim harris gone. Please advise and place referral?

## 2016-10-06 NOTE — Telephone Encounter (Signed)
Sorry, I honestly thought I had placed the referral for patient to see Gynecology. I will go ahead and place the referral now.

## 2016-10-11 ENCOUNTER — Other Ambulatory Visit: Payer: 59

## 2016-10-11 ENCOUNTER — Ambulatory Visit (INDEPENDENT_AMBULATORY_CARE_PROVIDER_SITE_OTHER): Payer: 59 | Admitting: Internal Medicine

## 2016-10-11 ENCOUNTER — Encounter: Payer: Self-pay | Admitting: Internal Medicine

## 2016-10-11 ENCOUNTER — Encounter (INDEPENDENT_AMBULATORY_CARE_PROVIDER_SITE_OTHER): Payer: Self-pay

## 2016-10-11 ENCOUNTER — Telehealth: Payer: Self-pay | Admitting: *Deleted

## 2016-10-11 VITALS — Ht 64.0 in | Wt 223.0 lb

## 2016-10-11 DIAGNOSIS — K219 Gastro-esophageal reflux disease without esophagitis: Secondary | ICD-10-CM

## 2016-10-11 DIAGNOSIS — R1031 Right lower quadrant pain: Secondary | ICD-10-CM | POA: Diagnosis not present

## 2016-10-11 DIAGNOSIS — Z8619 Personal history of other infectious and parasitic diseases: Secondary | ICD-10-CM | POA: Diagnosis not present

## 2016-10-11 DIAGNOSIS — N83201 Unspecified ovarian cyst, right side: Secondary | ICD-10-CM

## 2016-10-11 NOTE — Telephone Encounter (Signed)
Patient has been scheduled to see Dr Everlene Farrier at [facility] for RLQ abdominal pain and Right ovarian cyst on Wednesday, 10/18/16 @ 11:10 am with 10:40 am arrival. Patient has been given their office phone number and address. Patient verbalizes understanding of this information.

## 2016-10-11 NOTE — Progress Notes (Signed)
Subjective:    Patient ID: [name] [name], female    DOB: 1970/12/24, 46 y.o.   MRN: [id number]  HPI [name] [name] is a 46 year old female known to me from evaluation in 2015 for right upper quadrant abdominal pain with nausea diagnosed with H. pylori gastritis and status post treatment who is seen in consultation at the request of Molli Barrows, FNP to evaluate right lower quadrant pain. She is here alone today.  Over the last 3-4 weeks she has developed right lower quadrant abdominal pain. This is fairly constant in nature. This led to an ER visit on 09/19/2016 where a CT scan of the abdomen and pelvis was performed. This showed a probable mild hemorrhagic cyst in the right ovary measuring 4.1 cm. There was no other evidence of acute inflammatory process within the abdomen or pelvis. There was mild fatty liver. There are no calcified gallstones. The bowel appeared normal. No kidney stones are seen. Degenerative changes were seen in the thoracic spine. Pancreas is unremarkable. The pain is improved somewhat but not resolved. Again this is extreme right lower quadrant and fairly consistent. She is having regular periods.  She reports symptoms discussed in 2015 improved after treatment for H. pylori gastritis. She reports no longer feeling right upper quadrant pain and nausea has improved. She denies heartburn but has continued omeprazole 20 mg daily which she states works very well. No dysphagia or odynophagia. No early satiety. No vomiting. Bowel movements a been regular without blood or melena. She denies diarrhea and constipation.   Review of Systems As per HPI, otherwise negative  Current Medications, Allergies, Past Medical History, Past Surgical History, Family History and Social History were reviewed in [employer] record.     Objective:   Physical Exam Ht 5\' 4"  (1.626 m)   Wt 223 lb (101.2 kg)   BMI 38.28 kg/m  Constitutional: Well-developed and  well-nourished. No distress. HEENT: Normocephalic and atraumatic. Oropharynx is clear and moist. Conjunctivae are normal.  No scleral icterus. Neck: Neck supple. Trachea midline. Cardiovascular: Normal rate, regular rhythm and intact distal pulses. No M/R/G Pulmonary/chest: Effort normal and breath sounds normal. No wheezing, rales or rhonchi. Abdominal: Soft, Mildly tender in the right lower quadrant , nondistended. Bowel sounds active throughout. There are no masses palpable. No hepatosplenomegaly. Extremities: no clubbing, cyanosis, or edema Neurological: Alert and oriented to person place and time. Skin: Skin is warm and dry. Psychiatric: Normal mood and affect. Behavior is normal.  CT ABDOMEN AND PELVIS WITH CONTRAST   TECHNIQUE: Multidetector CT imaging of the abdomen and pelvis was performed using the standard protocol following bolus administration of intravenous contrast.   CONTRAST:  163mL ISOVUE-300 IOPAMIDOL (ISOVUE-300) INJECTION 61%   COMPARISON:  05/15/2014   FINDINGS: Lower chest: Lung bases shows no acute findings.   Hepatobiliary: There is mild fatty infiltration of the liver. No calcified gallstones are noted within gallbladder.   Pancreas: Unremarkable. No pancreatic ductal dilatation or surrounding inflammatory changes.   Spleen: Normal in size without focal abnormality.   Adrenals/Urinary Tract: No adrenal gland mass. Enhanced kidneys are symmetrical in size. No hydronephrosis or hydroureter. Delayed renal images shows bilateral renal symmetrical excretion. Bilateral visualized proximal ureter is unremarkable. The urinary bladder is under distended grossly unremarkable.   Stomach/Bowel: There is no small bowel obstruction. Oral contrast material was given to the patient. No thickened or dilated small bowel loops. No pericecal inflammation. Normal retrocecal appendix is noted in axial image 52. Some contrast material a moderate  stool noted in right  colon and transverse colon. Some colonic stool noted within descending colon. No evidence of colitis or diverticulitis. No distal colonic obstruction.   Vascular/Lymphatic: No aortic aneurysm. No retroperitoneal or mesenteric adenopathy.   Reproductive: The uterus is anteflexed. There is probable mild hemorrhagic cyst in right ovary measures 4.1 cm. No pelvic free fluid is noted.   Other: No ascites or free abdominal air.  No inguinal adenopathy.   Musculoskeletal: Sagittal images of the spine shows mild degenerative changes lower thoracic spine. No destructive bony lesions are noted.   IMPRESSION: 1. There is no evidence of acute inflammatory process within abdomen. Mild fatty infiltration of the liver. No calcified gallstones are noted within gallbladder. 2. Normal appendix. No pericecal inflammation. Moderate stool and some contrast material noted within right colon and transverse colon. No small bowel or colonic obstruction. No distal colitis or diverticulitis. 3. Probable mild hemorrhagic cyst within right ovary measures 4.1 cm. Follow-up pelvic ultrasound is recommended in 6-8 weeks to assure resolution or stability. No pelvic free fluid. Uterus is anteflexed. 4. No hydronephrosis or hydroureter.  No nephrolithiasis. 5. Degenerative changes lower thoracic spine.     Electronically Signed   By: Lahoma Crocker M.D.   On: 09/19/2016 16:32   CBC    Component Value Date/Time   WBC 3.8 08/19/2016 1509   WBC 4.3 (A) 04/03/2016 1342   WBC 3.6 (L) 03/03/2016 1254   RBC 3.92 08/19/2016 1509   RBC 3.86 04/03/2016 1526   HGB 12.0 04/03/2016 1526   HCT 33.0 (L) 08/19/2016 1509   PLT 230 08/19/2016 1509   MCV 84 08/19/2016 1509   MCH 26.0 (L) 08/19/2016 1509   MCH 31.1 04/03/2016 1526   MCHC 30.9 (L) 08/19/2016 1509   MCHC 34.4 04/03/2016 1342   MCHC 32.4 03/03/2016 1254   RDW 14.7 08/19/2016 1509   LYMPHSABS 1.7 08/19/2016 1509   MONOABS 384 12/07/2015 1907   EOSABS 0.0  08/19/2016 1509   BASOSABS 0.0 08/19/2016 1509   CMP     Component Value Date/Time   NA 137 08/19/2016 1509   K 4.3 08/19/2016 1509   CL 100 08/19/2016 1509   CO2 23 08/19/2016 1509   GLUCOSE 77 08/19/2016 1509   GLUCOSE 77 12/07/2015 1907   BUN 9 08/19/2016 1509   CREATININE 0.69 08/19/2016 1509   CREATININE 0.82 12/07/2015 1907   CALCIUM 9.4 08/19/2016 1509   PROT 7.3 08/19/2016 1509   ALBUMIN 4.2 08/19/2016 1509   AST 16 08/19/2016 1509   ALT 18 08/19/2016 1509   ALKPHOS 58 08/19/2016 1509   BILITOT 0.3 08/19/2016 1509   GFRNONAA 104 08/19/2016 1509   GFRNONAA 86 12/07/2015 1907   GFRAA 120 08/19/2016 1509   GFRAA >89 12/07/2015 1907       Assessment & Plan:   46 year old female known to me from evaluation in 2015 for right upper quadrant abdominal pain with nausea diagnosed with H. pylori gastritis and status post treatment who is seen in consultation at the request of Molli Barrows, FNP to evaluate right lower quadrant pain.  1. Right lower quadrant pain -- likely secondary to hemorrhagic ovarian cyst seen on recent CT scan. She was to see gynecology but she reports this appoint was never made. She has never seen gynecology. [facility] for this issue.  2. History of H. pylori gastritis -- status post antibiotic treatment. H. pylori stool antigen recommended off PPI to confirm eradication. Symptoms associated with  this have resolved.  3. GERD -- controlled well with daily omeprazole. Continue omeprazole 20 mg daily  15 minutes spent with the patient today. Greater than 50% was spent in counseling and coordination of care with the patient

## 2016-10-11 NOTE — Addendum Note (Signed)
Addended by: Larina Bras on: 10/11/2016 01:06 PM   Modules accepted: Orders

## 2016-10-11 NOTE — Patient Instructions (Signed)
If you are age 48 or older, your body mass index should be between 23-30. Your Body mass index is 38.28 kg/m. If this is out of the aforementioned range listed, please consider follow up with your Primary Care Provider.  If you are age 2 or younger, your body mass index should be between 19-25. Your Body mass index is 38.28 kg/m. If this is out of the aformentioned range listed, please consider follow up with your Primary Care Provider.   Your physician has requested that you go to the basement for the following lab work before leaving today: H.pylori stool trest.  We will send a referral to [facility] in [place]. They will contact you with an appointment.  Continue Omeprazole daily.  Please follow up in 1 year.  Thank you for choosing [facility]

## 2016-10-11 NOTE — Addendum Note (Signed)
Addended by: Jerene Bears on: 10/11/2016 12:08 PM   Modules accepted: Level of Service

## 2016-10-12 ENCOUNTER — Other Ambulatory Visit: Payer: 59

## 2016-10-12 ENCOUNTER — Other Ambulatory Visit: Payer: Self-pay | Admitting: Internal Medicine

## 2016-10-12 DIAGNOSIS — R1031 Right lower quadrant pain: Secondary | ICD-10-CM

## 2016-10-12 DIAGNOSIS — Z8619 Personal history of other infectious and parasitic diseases: Secondary | ICD-10-CM

## 2016-10-12 DIAGNOSIS — K219 Gastro-esophageal reflux disease without esophagitis: Secondary | ICD-10-CM

## 2016-10-13 LAB — HELICOBACTER PYLORI  SPECIAL ANTIGEN: H. PYLORI ANTIGEN STOOL: NOT DETECTED

## 2016-10-18 DIAGNOSIS — Z01419 Encounter for gynecological examination (general) (routine) without abnormal findings: Secondary | ICD-10-CM | POA: Diagnosis not present

## 2016-10-24 ENCOUNTER — Ambulatory Visit (INDEPENDENT_AMBULATORY_CARE_PROVIDER_SITE_OTHER): Payer: 59 | Admitting: Family Medicine

## 2016-10-24 VITALS — BP 141/94 | HR 78 | Temp 98.5°F | Resp 18 | Ht 64.0 in | Wt 222.0 lb

## 2016-10-24 DIAGNOSIS — D500 Iron deficiency anemia secondary to blood loss (chronic): Secondary | ICD-10-CM

## 2016-10-24 DIAGNOSIS — R079 Chest pain, unspecified: Secondary | ICD-10-CM | POA: Diagnosis not present

## 2016-10-24 DIAGNOSIS — R0789 Other chest pain: Secondary | ICD-10-CM

## 2016-10-24 LAB — POCT CBC
GRANULOCYTE PERCENT: 47.6 % (ref 37–80)
HCT, POC: 37.6 % — AB (ref 37.7–47.9)
HEMOGLOBIN: 12.6 g/dL (ref 12.2–16.2)
Lymph, poc: 2.4 (ref 0.6–3.4)
MCH: 30.4 pg (ref 27–31.2)
MCHC: 33.5 g/dL (ref 31.8–35.4)
MCV: 90.7 fL (ref 80–97)
MID (cbc): 0.3 (ref 0–0.9)
MPV: 9.9 fL (ref 0–99.8)
PLATELET COUNT, POC: 164 10*3/uL (ref 142–424)
POC Granulocyte: 2.5 (ref 2–6.9)
POC LYMPH PERCENT: 46.1 %L (ref 10–50)
POC MID %: 6.3 % (ref 0–12)
RBC: 4.14 M/uL (ref 4.04–5.48)
RDW, POC: 15.8 %
WBC: 5.2 10*3/uL (ref 4.6–10.2)

## 2016-10-24 NOTE — Progress Notes (Signed)
Chief Complaint  Patient presents with  . 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 Anemia   . Fatty liver   . H. pylori infection   . HTN (hypertension)   . Hx of cardiovascular stress test    a. ETT-Myoview 05/28/12: Low risk study with a small, mild fixed basal inferior perfusion defect most likely representing soft tissue attenuation with inferolateral HK (wall motion abnormality does not correlate with inferior perfusion defect), EF 49%.  Marland Kitchen Hx of echocardiogram    a. Echocardiogram 06/11/12: EF 50-55%, normal wall motion, grade 1 diastolic dysfunction, trivial AI, mild MR, mild LAE, mild RAE.  . RUQ pain     Current Outpatient Prescriptions  Medication Sig Dispense Refill  . ferrous sulfate 325 (65 FE) MG tablet Take 1 tablet (325 mg total) by mouth daily with breakfast. 90 tablet 3  . losartan (COZAAR) 100 MG tablet Take 1 tablet (100 mg total) by mouth daily. 90 tablet 3  . omeprazole (PRILOSEC) 40 MG capsule Take 1 capsule (40 mg total) by mouth daily. 30 capsule 3   No current facility-administered medications for this visit.     Allergies:  Allergies  Allergen Reactions  . Hctz [Hydrochlorothiazide]     Heart palpitations    Past Surgical History:  Procedure Laterality Date  . NO PAST SURGERIES      Social History   Social History  . Marital status: Married    Spouse name: N/A  . Number of children: 4  . Years  of education: N/A   Occupational History  . Housekeeper     at hotel  .  [employer]   Social History Main Topics  . Smoking status: Never Smoker  . Smokeless tobacco: Never Used  . Alcohol use No  . Drug use: No  . Sexual activity: No   Other Topics Concern  . None   Social History Narrative  . None    ROS See hpi  Objective: Vitals:   10/24/16 1605 10/24/16 1617  BP: (!) 154/102 (!) 141/94  Pulse: 85 78  Resp: 18   Temp: 98.5 F (36.9 C)   TempSrc: Oral   SpO2: 100%   Weight: 222 lb (100.7 kg)   Height: 5\' 4"  (1.626 m)     Physical Exam  Constitutional: She is oriented to person, place, and time. She appears well-developed and well-nourished.  HENT:  Head: Normocephalic and atraumatic.  Eyes: Conjunctivae and EOM are normal.  Neck: Normal range of motion. No thyromegaly present.  Cardiovascular: Normal rate, regular rhythm and normal heart sounds.   No murmur heard. Pulmonary/Chest: Effort normal and breath sounds normal. No respiratory distress. She has no wheezes.    Abdominal: Soft. Bowel sounds are normal. She exhibits no distension. There is no tenderness.  Neurological: She is alert and oriented  to person, place, and time.     Independent review of ECG shows PVCs and ventricular Bigeminy No st elevation  Assessment and Plan Lynessa was seen today for chest pain.  Diagnoses and all orders for this visit:  Intermittent left-sided chest pain- pt advised to follow up with Cardiology for stress test Also discussed that she does not have acute st changes thus will refer to Cardiology Last visit 2018 -     EKG 12-Lead -     Ambulatory referral to Cardiology  Acute chest pain- no recent Cardiology visit Will place a referral today -     POCT CBC -     Comprehensive metabolic panel -     Lipase -     Ambulatory referral to Cardiology  Iron deficiency anemia due to chronic blood loss- CBC shows improved hemoglobin Continue iron Follow up  with Gyne for ovarian cyst   A total of 25 minutes were spent face-to-face with the patient during this encounter and over half of that time was spent on counseling and coordination of care.   [place]

## 2016-10-24 NOTE — Patient Instructions (Addendum)
     IF you received an x-ray today, you will receive an invoice from [facility]. Please contact [facility] at [phone] with questions or concerns regarding your invoice.   IF you received labwork today, you will receive an invoice from [facility]. Please contact [facility] at [phone] with questions or concerns regarding your invoice.   Our billing staff will not be able to assist you with questions regarding bills from these companies.  You will be contacted with the lab results as soon as they are available. The fastest way to get your results is to activate your My Chart account. Instructions are located on the last page of this paperwork. If you have not heard from us regarding the results in 2 weeks, please contact this office.     Chest Wall Pain Chest wall pain is pain in or around the bones and muscles of your chest. Sometimes, an injury causes this pain. Sometimes, the cause may not be known. This pain may take several weeks or longer to get better. Follow these instructions at home: Pay attention to any changes in your symptoms. Take these actions to help with your pain:  Rest as told by your health care provider.  Avoid activities that cause pain. These include any activities that use your chest muscles or your abdominal and side muscles to lift heavy items.  If directed, apply ice to the painful area: ? Put ice in a plastic bag. ? Place a towel between your skin and the bag. ? Leave the ice on for 20 minutes, 2-3 times per day.  Take over-the-counter and prescription medicines only as told by your health care provider.  Do not use tobacco products, including cigarettes, chewing tobacco, and e-cigarettes. If you need help quitting, ask your health care provider.  Keep all follow-up visits as told by your health care provider. This is important.  Contact a health care provider if:  You have a fever.  Your chest pain becomes worse.  You have  new symptoms. Get help right away if:  You have nausea or vomiting.  You feel sweaty or light-headed.  You have a cough with phlegm (sputum) or you cough up blood.  You develop shortness of breath. This information is not intended to replace advice given to you by your health care provider. Make sure you discuss any questions you have with your health care provider. Document Released: 06/19/2005 Document Revised: 10/28/2015 Document Reviewed: 09/14/2014 Elsevier Interactive Patient Education  2017 Elsevier Inc.  

## 2016-10-25 LAB — COMPREHENSIVE METABOLIC PANEL
ALBUMIN: 4.3 g/dL (ref 3.5–5.5)
ALK PHOS: 63 IU/L (ref 39–117)
ALT: 18 IU/L (ref 0–32)
AST: 18 IU/L (ref 0–40)
Albumin/Globulin Ratio: 1.5 (ref 1.2–2.2)
BUN / CREAT RATIO: 18 (ref 9–23)
BUN: 14 mg/dL (ref 6–24)
Bilirubin Total: <0.2 mg/dL (ref 0.0–1.2)
CO2: 21 mmol/L (ref 18–29)
CREATININE: 0.77 mg/dL (ref 0.57–1.00)
Calcium: 9.6 mg/dL (ref 8.7–10.2)
Chloride: 101 mmol/L (ref 96–106)
GFR, EST AFRICAN AMERICAN: 106 mL/min/{1.73_m2} (ref >59)
GFR, EST NON AFRICAN AMERICAN: 92 mL/min/{1.73_m2} (ref >59)
GLOBULIN, TOTAL: 2.8 g/dL (ref 1.5–4.5)
Glucose: 87 mg/dL (ref 65–99)
Potassium: 4.5 mmol/L (ref 3.5–5.2)
SODIUM: 137 mmol/L (ref 134–144)
TOTAL PROTEIN: 7.1 g/dL (ref 6.0–8.5)

## 2016-10-25 LAB — LIPASE: LIPASE: 34 U/L (ref 14–72)

## 2016-10-25 NOTE — Progress Notes (Signed)
Cardiology Office Note   Date:  10/26/2016   ID:  [name] [name], DOB [birth year], MRN [id number]  PCP:  Forrest Moron, MD  Cardiologist:   Jenkins Rouge, MD   No chief complaint on file.     History of Present Illness: [name] [name] is a 46 y.o. female who presents for reestablish visit Referred by Delia Chimes MD for consultation/evaluaiton of chest pain Last seen over 3 years ago.   At that time Had CRF;s HTN and had chronic chest / epigastric pain. Also history of palpitations and PVC;s Rx with calcium blocker .  Echo in 2013 reviewed EF 50-55% mild MR Myovue 2013 mild fixed basal inferior defect ? Artifact EF 49%    Seen by [facility] 10/24/16   Pain is squeezing for 3 days LLSB.  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 Pain sometimes better with banana and orange juice  No GI overtones and she is compliant with her prilosec    Past Medical History:  Diagnosis Date  . Anemia   . Fatty liver   . H. pylori infection   . HTN (hypertension)   . Hx of cardiovascular stress test    a. ETT-Myoview 05/28/12: Low risk study with a small, mild fixed basal inferior perfusion defect most likely representing soft tissue attenuation with inferolateral HK (wall motion abnormality does not correlate with inferior perfusion defect), EF 49%.  Marland Kitchen Hx of echocardiogram    a. Echocardiogram 06/11/12: EF 50-55%, normal wall motion, grade 1 diastolic dysfunction, trivial AI, mild MR, mild LAE, mild RAE.  . RUQ pain     Past Surgical History:  Procedure Laterality Date  . NO PAST SURGERIES       Current Outpatient Prescriptions  Medication Sig Dispense Refill  . ferrous sulfate 325 (65 FE) MG tablet Take 1 tablet (325 mg total) by mouth daily with breakfast. 90 tablet 3  . losartan (COZAAR) 100 MG tablet Take 1 tablet (100 mg total) by mouth daily. 90 tablet 3  . omeprazole (PRILOSEC) 40 MG capsule Take 1 capsule (40 mg total) by  mouth daily. 30 capsule 3   No current facility-administered medications for this visit.     Allergies:   Hctz [hydrochlorothiazide]    Social History:  The patient  reports that she has never smoked. She has never used smokeless tobacco. She reports that she does not drink alcohol or use drugs.   Family History:  The patient's family history includes Coronary artery disease in her mother; Other in her brother and other.    ROS:  Please see the history of present illness.   Otherwise, review of systems are positive for none.   All other systems are reviewed and negative.    PHYSICAL EXAM: VS:  BP 130/80   Pulse 97   Ht 5\' 4"  (1.626 m)   Wt 218 lb (98.9 kg)   SpO2 98%   BMI 37.42 kg/m  , BMI Body mass index is 37.42 kg/m. Affect appropriate Overweight black female  HEENT: normal Neck supple with no adenopathy JVP normal no bruits no thyromegaly Lungs clear with no wheezing and good diaphragmatic motion Heart:  S1/S2 no murmur, no rub, gallop or click PMI normal Abdomen: benighn, BS positve, no tenderness, no AAA no bruit.  No HSM or HJR Distal pulses intact with no bruits No edema Neuro non-focal Skin warm and dry No muscular weakness    EKG:  SR rate 73  PVC;s normal ST segments    Recent Labs: 08/19/2016: Platelets 230; TSH 1.380 10/24/2016: ALT 18; BUN 14; Creatinine, Ser 0.77; Hemoglobin 12.6; Potassium 4.5; Sodium 137    Lipid Panel    Component Value Date/Time   CHOL 212 (H) 05/05/2012 1618   TRIG 59 05/05/2012 1618   HDL 81 05/05/2012 1618   CHOLHDL 2.6 05/05/2012 1618   VLDL 12 05/05/2012 1618   LDLCALC 119 (H) 05/05/2012 1618      Wt Readings from Last 3 Encounters:  10/26/16 218 lb (98.9 kg)  10/24/16 222 lb (100.7 kg)  10/11/16 223 lb (101.2 kg)      Other studies Reviewed: Additional studies/ records that were reviewed today include: Cardiology notes 2013 including echo and myovue Primary care notes [place] 10/24/16 .    ASSESSMENT  AND PLAN:  1. Chest Pain atypical resolved f/u ETT 2. HTN Well controlled.  Continue current medications and low sodium Dash type diet.   3. PVCls  Check echo to make sure EF normal if so and ETT negative observe 4. GERD discussed low carb diet and weight loss 5. Anemia f/u primary no colorectal bleeding    Current medicines are reviewed at length with the patient today.  The patient does not have concerns regarding medicines.  The following changes have been made:  no change  Labs/ tests ordered today include: ETT Echo  No orders of the defined types were placed in this encounter.    Disposition:   FU with me in a year      Signed, Jenkins Rouge, MD  10/26/2016 4:31 PM    [facility] [place], [place], [place]  [address] Phone: [phone]; Fax: [phone]

## 2016-10-26 ENCOUNTER — Ambulatory Visit (INDEPENDENT_AMBULATORY_CARE_PROVIDER_SITE_OTHER): Payer: 59 | Admitting: Cardiovascular Disease

## 2016-10-26 VITALS — BP 130/80 | HR 97 | Ht 64.0 in | Wt 218.0 lb

## 2016-10-26 DIAGNOSIS — R079 Chest pain, unspecified: Secondary | ICD-10-CM | POA: Diagnosis not present

## 2016-10-26 DIAGNOSIS — R0600 Dyspnea, unspecified: Secondary | ICD-10-CM

## 2016-10-26 NOTE — Patient Instructions (Signed)
Medication Instructions:  Your physician recommends that you continue on your current medications as directed. Please refer to the Current Medication list given to you today.  Labwork: NONE  Testing/Procedures: Your physician has requested that you have an echocardiogram. Echocardiography is a painless test that uses sound waves to create images of your heart. It provides your doctor with information about the size and shape of your heart and how well your heart's chambers and valves are working. This procedure takes approximately one hour. There are no restrictions for this procedure.  Your physician has requested that you have an exercise tolerance test. For further information please visit [email or web address]. Please also follow instruction sheet, as given.  Follow-Up: Your physician wants you to follow-up in:12  months with Dr. Johnsie Cancel. You will receive a reminder letter in the mail two months in advance. If you don't receive a letter, please call our office to schedule the follow-up appointment.   If you need a refill on your cardiac medications before your next appointment, please call your pharmacy.

## 2016-11-03 DIAGNOSIS — N924 Excessive bleeding in the premenopausal period: Secondary | ICD-10-CM | POA: Diagnosis not present

## 2016-11-03 DIAGNOSIS — R1031 Right lower quadrant pain: Secondary | ICD-10-CM | POA: Diagnosis not present

## 2016-11-07 DIAGNOSIS — N880 Leukoplakia of cervix uteri: Secondary | ICD-10-CM | POA: Diagnosis not present

## 2016-11-08 DIAGNOSIS — R87610 Atypical squamous cells of undetermined significance on cytologic smear of cervix (ASC-US): Secondary | ICD-10-CM | POA: Diagnosis not present

## 2016-11-14 ENCOUNTER — Other Ambulatory Visit: Payer: Self-pay

## 2016-11-14 ENCOUNTER — Ambulatory Visit (INDEPENDENT_AMBULATORY_CARE_PROVIDER_SITE_OTHER): Payer: 59

## 2016-11-14 ENCOUNTER — Ambulatory Visit (HOSPITAL_COMMUNITY): Payer: 59 | Attending: Cardiology

## 2016-11-14 DIAGNOSIS — K760 Fatty (change of) liver, not elsewhere classified: Secondary | ICD-10-CM | POA: Insufficient documentation

## 2016-11-14 DIAGNOSIS — D649 Anemia, unspecified: Secondary | ICD-10-CM | POA: Insufficient documentation

## 2016-11-14 DIAGNOSIS — R0600 Dyspnea, unspecified: Secondary | ICD-10-CM

## 2016-11-14 DIAGNOSIS — I081 Rheumatic disorders of both mitral and tricuspid valves: Secondary | ICD-10-CM | POA: Diagnosis not present

## 2016-11-14 DIAGNOSIS — R079 Chest pain, unspecified: Secondary | ICD-10-CM | POA: Diagnosis not present

## 2016-11-14 DIAGNOSIS — I10 Essential (primary) hypertension: Secondary | ICD-10-CM | POA: Diagnosis not present

## 2016-11-14 LAB — EXERCISE TOLERANCE TEST
CHL CUP RESTING HR STRESS: 67 {beats}/min
CSEPEDS: 44 s
CSEPEW: 6.6 METS
CSEPPHR: 151 {beats}/min
Exercise duration (min): 4 min
MPHR: 173 {beats}/min
Percent HR: 87 %
RPE: 16

## 2016-11-16 ENCOUNTER — Telehealth: Payer: Self-pay

## 2016-11-16 DIAGNOSIS — R9430 Abnormal result of cardiovascular function study, unspecified: Secondary | ICD-10-CM

## 2016-11-16 DIAGNOSIS — R079 Chest pain, unspecified: Secondary | ICD-10-CM

## 2016-11-16 NOTE — Telephone Encounter (Signed)
-----   Message from Josue Hector, MD sent at 11/14/2016 11:53 PM EDT ----- Needs lexiscan myouve ECG not interpretable   ----- Message ----- From: Pixie Casino, MD Sent: 11/14/2016   1:40 PM To: Josue Hector, MD

## 2016-11-16 NOTE — Telephone Encounter (Signed)
Patient does not want to schedule lexiscan until she has talked to billing. Patient stated if insurance does not cover test, then she will not do it.

## 2016-11-16 NOTE — Telephone Encounter (Signed)
Left message for patient to call back. Received feedback from billing as seen below-  "Pt needs to call insurance company. They can explain pt's benefits etc.  Please have pt use CPT code [phone] for outpt hospital nuc test."

## 2016-11-16 NOTE — Telephone Encounter (Signed)
Left message for patient to call back.  Called other phone number provided. Patient not available, spoke with patient's husband (DPR). Informed him that patient will need to have a lexiscan and someone from scheduling will call patient to schedule test. Patient's husband is concerned about insurance and what is covered. Will send message to billing for help. Patient's husband verbalized understanding.

## 2016-11-18 ENCOUNTER — Telehealth: Payer: Self-pay | Admitting: Family Medicine

## 2016-11-18 NOTE — Telephone Encounter (Signed)
Reviewed chart and patient left consent that we can speak to husband  Told husband her labs were normal He states that she is having pain in her side despite normal [place] Told him to tell her to follow up in clinic

## 2016-11-21 ENCOUNTER — Ambulatory Visit (INDEPENDENT_AMBULATORY_CARE_PROVIDER_SITE_OTHER): Payer: 59 | Admitting: Family Medicine

## 2016-11-21 ENCOUNTER — Encounter: Payer: Self-pay | Admitting: Family Medicine

## 2016-11-21 VITALS — BP 135/83 | HR 76 | Temp 99.3°F | Resp 16 | Ht 64.0 in | Wt 220.0 lb

## 2016-11-21 DIAGNOSIS — R0789 Other chest pain: Secondary | ICD-10-CM | POA: Diagnosis not present

## 2016-11-21 DIAGNOSIS — R1031 Right lower quadrant pain: Secondary | ICD-10-CM | POA: Diagnosis not present

## 2016-11-21 NOTE — Progress Notes (Signed)
Chief Complaint  Patient presents with  . Abdominal Pain    swollen on right side for  2-3 months, said is getting bigger     HPI   Pt reports that she had an abdominal bulge on the right She states that it is worse with straining Bending lifting She reports that it seems to extend from her umbilicus to her side She notices that when she lays flat to sleep it disappears It is painful at times and seems to be aggravated when she is working, bending or lifting She denies pain with BM No blood in her stools    She had 4 pregnancies She denies any abdominal surgeries She recalls being told she had a hernia when she was a young adult She is trying to lose weight and has lost about 18-20 pounds  Past Medical History:  Diagnosis Date  . Anemia   . Fatty liver   . H. pylori infection   . HTN (hypertension)   . Hx of cardiovascular stress test    a. ETT-Myoview 05/28/12: Low risk study with a small, mild fixed basal inferior perfusion defect most likely representing soft tissue attenuation with inferolateral HK (wall motion abnormality does not correlate with inferior perfusion defect), EF 49%.  Marland Kitchen Hx of echocardiogram    a. Echocardiogram 06/11/12: EF 50-55%, normal wall motion, grade 1 diastolic dysfunction, trivial AI, mild MR, mild LAE, mild RAE.  . RUQ pain     Current Outpatient Prescriptions  Medication Sig Dispense Refill  . ferrous sulfate 325 (65 FE) MG tablet Take 1 tablet (325 mg total) by mouth daily with breakfast. 90 tablet 3  . losartan (COZAAR) 100 MG tablet Take 1 tablet (100 mg total) by mouth daily. 90 tablet 3  . omeprazole (PRILOSEC) 40 MG capsule Take 1 capsule (40 mg total) by mouth daily. 30 capsule 3   No current facility-administered medications for this visit.     Allergies:  Allergies  Allergen Reactions  . Hctz [Hydrochlorothiazide]     Heart palpitations    Past Surgical History:  Procedure Laterality Date  . NO PAST SURGERIES       Social History   Social History  . Marital status: Married    Spouse name: N/A  . Number of children: 4  . Years of education: N/A   Occupational History  . Housekeeper     at hotel  .  [employer]   Social History Main Topics  . Smoking status: Never Smoker  . Smokeless tobacco: Never Used  . Alcohol use No  . Drug use: No  . Sexual activity: No   Other Topics Concern  . None   Social History Narrative  . None    ROS  Objective: Vitals:   11/21/16 1026  BP: 135/83  Pulse: 76  Resp: 16  Temp: 99.3 F (37.4 C)  TempSrc: Oral  SpO2: 100%  Weight: 220 lb (99.8 kg)  Height: 5\' 4"  (1.626 m)    Physical Exam  Constitutional: She is oriented to person, place, and time. She appears well-developed and well-nourished.  HENT:  Head: Normocephalic and atraumatic.  Eyes: Conjunctivae and EOM are normal.  Cardiovascular: Normal rate, regular rhythm and normal heart sounds.   Pulmonary/Chest: Effort normal and breath sounds normal. No respiratory distress. She has no wheezes.  Abdominal: Soft. Bowel sounds are normal. She exhibits no distension. There is tenderness.    Neurological: She is alert and oriented to person, place, and time.  Assessment and Plan Nene was seen today for abdominal pain.  Diagnoses and all orders for this visit:  Abdominal wall pain in right lower quadrant-  Advised pt to follow for hernia evaluation -     Ambulatory referral to General Surgery  Intermittent chest pain resolved -   Labs reviewed with pt in office   [place]

## 2016-11-21 NOTE — Patient Instructions (Addendum)
IF you received an x-ray today, you will receive an invoice from [facility]. Please contact [facility] at [phone] with questions or concerns regarding your invoice.   IF you received labwork today, you will receive an invoice from [facility]. Please contact [facility] at [phone] with questions or concerns regarding your invoice.   Our billing staff will not be able to assist you with questions regarding bills from these companies.  You will be contacted with the lab results as soon as they are available. The fastest way to get your results is to activate your My Chart account. Instructions are located on the last page of this paperwork. If you have not heard from [place] regarding the results in 2 weeks, please contact this office.      Ventral Hernia A ventral hernia is a bulge of tissue from inside the abdomen that pushes through a weak area of the muscles that form the front wall of the abdomen. The tissues inside the abdomen are inside a sac (peritoneum). These tissues include the small intestine, large intestine, and the fatty tissue that covers the intestines (omentum). Sometimes, the bulge that forms a hernia contains intestines. Other hernias contain only fat. Ventral hernias do not go away without surgical treatment. There are several types of ventral hernias. You may have:  A hernia at an incision site from previous abdominal surgery (incisional hernia).  A hernia just above the belly button (epigastric hernia), or at the belly button (umbilical hernia). These types of hernias can develop from heavy lifting or straining.  A hernia that comes and goes (reducible hernia). It may be visible only when you lift or strain. This type of hernia can be pushed back into the abdomen (reduced).  A hernia that traps abdominal tissue inside the hernia (incarcerated hernia). This type of hernia does not reduce.  A hernia that cuts off blood flow to the tissues inside  the hernia (strangulated hernia). The tissues can start to die if this happens. This is a very painful bulge that cannot be reduced. A strangulated hernia is a medical emergency. What are the causes? This condition is caused by abdominal tissue putting pressure on an area of weakness in the abdominal muscles. What increases the risk? The following factors may make you more likely to develop this condition:  Being female.  Being 86 or older.  Being overweight or obese.  Having had previous abdominal surgery, especially if there was an infection after surgery.  Having had an injury to the abdominal wall.  Having had several pregnancies.  Having a buildup of fluid inside the abdomen (ascites). What are the signs or symptoms? The only symptom of a ventral hernia may be a painless bulge in the abdomen. A reducible hernia may be visible only when you strain, cough, or lift. Other symptoms may include:  Dull pain.  A feeling of pressure. Signs and symptoms of a strangulated hernia may include:  Increasing pain.  Nausea and vomiting.  Pain when pressing on the hernia.  The skin over the hernia turning red or purple.  Constipation.  Blood in the stool (feces). How is this diagnosed? This condition may be diagnosed based on:  Your symptoms.  Your medical history.  A physical exam. You may be asked to cough or strain while standing. These actions increase the pressure inside your abdomen and force the hernia through the opening in your muscles. Your health care provider may try to reduce the hernia by pressing on  it.  Imaging studies, such as an ultrasound or CT scan. How is this treated? This condition is treated with surgery. If you have a strangulated hernia, surgery is done as soon as possible. If your hernia is small and not incarcerated, you may be asked to lose some weight before surgery. Follow these instructions at home:  Follow instructions from your health care  provider about eating or drinking restrictions.  If you are overweight, your health care provider may recommend that you increase your activity level and eat a healthier diet.  Do not lift anything that is heavier than 10 lb (4.5 kg).  Return to your normal activities as told by your health care provider. Ask your health care provider what activities are safe for you. You may need to avoid activities that increase pressure on your hernia.  Take over-the-counter and prescription medicines only as told by your health care provider.  Keep all follow-up visits as told by your health care provider. This is important. Contact a health care provider if:  Your hernia gets larger.  Your hernia becomes painful. Get help right away if:  Your hernia becomes increasingly painful.  You have pain along with any of the following:  Changes in skin color in the area of the hernia.  Nausea.  Vomiting.  Fever. Summary  A ventral hernia is a bulge of tissue from inside the abdomen that pushes through a weak area of the muscles that form the front wall of the abdomen.  This condition is treated with surgery, which may be urgent depending on your hernia.  Do not lift anything that is heavier than 10 lb (4.5 kg), and follow activity instructions from your health care provider. This information is not intended to replace advice given to you by your health care provider. Make sure you discuss any questions you have with your health care provider. Document Released: 06/05/2012 Document Revised: 02/04/2016 Document Reviewed: 02/04/2016 Elsevier Interactive Patient Education  2017 [employer].

## 2016-11-29 ENCOUNTER — Telehealth (HOSPITAL_COMMUNITY): Payer: Self-pay | Admitting: Cardiovascular Disease

## 2016-12-01 NOTE — Telephone Encounter (Signed)
12/01/2016 08:10 AM Phone (Outgoing) [name] [name] (Self) [phone] (H)   No Answer/Busy - Called pt to r/s appt but she has no VM set up    By Verdene Rio

## 2016-12-20 ENCOUNTER — Telehealth (HOSPITAL_COMMUNITY): Payer: Self-pay | Admitting: *Deleted

## 2016-12-20 NOTE — Telephone Encounter (Signed)
Left message on voicemail in reference to upcoming appointment scheduled for 12/26/16. Phone number given for a call back so details instructions can be given. [name] [name]

## 2016-12-25 ENCOUNTER — Telehealth (HOSPITAL_COMMUNITY): Payer: Self-pay

## 2016-12-25 NOTE — Telephone Encounter (Signed)
Patient given detailed instructions per Myocardial Perfusion Study Information Sheet for the test on 12/26/16 at 0945. Patient notified to arrive 15 minutes early and that it is imperative to arrive on time for appointment to keep from having the test rescheduled.  If you need to cancel or reschedule your appointment, please call the office within 24 hours of your appointment. . Patient verbalized understanding. Janifer Adie, CNMT, RT-N

## 2016-12-26 ENCOUNTER — Ambulatory Visit (HOSPITAL_COMMUNITY): Payer: 59 | Attending: Cardiology

## 2016-12-26 DIAGNOSIS — I2510 Atherosclerotic heart disease of native coronary artery without angina pectoris: Secondary | ICD-10-CM | POA: Diagnosis not present

## 2016-12-26 DIAGNOSIS — R9430 Abnormal result of cardiovascular function study, unspecified: Secondary | ICD-10-CM | POA: Diagnosis not present

## 2016-12-26 DIAGNOSIS — I10 Essential (primary) hypertension: Secondary | ICD-10-CM | POA: Diagnosis not present

## 2016-12-26 DIAGNOSIS — R079 Chest pain, unspecified: Secondary | ICD-10-CM | POA: Diagnosis not present

## 2016-12-26 DIAGNOSIS — R002 Palpitations: Secondary | ICD-10-CM | POA: Diagnosis not present

## 2016-12-26 DIAGNOSIS — Z8249 Family history of ischemic heart disease and other diseases of the circulatory system: Secondary | ICD-10-CM | POA: Insufficient documentation

## 2016-12-26 MED ORDER — REGADENOSON 0.4 MG/5ML IV SOLN
0.4000 mg | Freq: Once | INTRAVENOUS | Status: AC
  Administered 2016-12-26: 0.4 mg via INTRAVENOUS

## 2016-12-26 MED ORDER — TECHNETIUM TC 99M TETROFOSMIN IV KIT
33.0000 | PACK | Freq: Once | INTRAVENOUS | Status: AC | PRN
  Administered 2016-12-26: 33 via INTRAVENOUS
  Filled 2016-12-26: qty 33

## 2016-12-29 ENCOUNTER — Ambulatory Visit (HOSPITAL_COMMUNITY): Payer: 59 | Attending: Cardiology

## 2016-12-29 LAB — MYOCARDIAL PERFUSION IMAGING
CHL CUP NUCLEAR SRS: 6
CHL CUP NUCLEAR SSS: 7
CHL CUP RESTING HR STRESS: 73 {beats}/min
CSEPPHR: 110 {beats}/min
NUC STRESS TID: 1.03
RATE: 0.3
SDS: 1

## 2016-12-29 MED ORDER — TECHNETIUM TC 99M TETROFOSMIN IV KIT
32.0000 | PACK | Freq: Once | INTRAVENOUS | Status: AC | PRN
  Administered 2016-12-29: 32 via INTRAVENOUS
  Filled 2016-12-29: qty 32

## 2017-01-17 DIAGNOSIS — M6208 Separation of muscle (nontraumatic), other site: Secondary | ICD-10-CM | POA: Diagnosis not present

## 2017-01-17 DIAGNOSIS — K429 Umbilical hernia without obstruction or gangrene: Secondary | ICD-10-CM | POA: Diagnosis not present

## 2017-01-17 DIAGNOSIS — R109 Unspecified abdominal pain: Secondary | ICD-10-CM | POA: Diagnosis not present

## 2017-03-08 ENCOUNTER — Ambulatory Visit (INDEPENDENT_AMBULATORY_CARE_PROVIDER_SITE_OTHER): Payer: 59 | Admitting: Family Medicine

## 2017-03-08 ENCOUNTER — Encounter: Payer: Self-pay | Admitting: Family Medicine

## 2017-03-08 VITALS — BP 138/88 | HR 80 | Temp 98.3°F | Resp 17 | Ht 64.0 in | Wt 219.8 lb

## 2017-03-08 DIAGNOSIS — N83201 Unspecified ovarian cyst, right side: Secondary | ICD-10-CM | POA: Diagnosis not present

## 2017-03-08 DIAGNOSIS — R8789 Other abnormal findings in specimens from female genital organs: Secondary | ICD-10-CM

## 2017-03-08 DIAGNOSIS — R1031 Right lower quadrant pain: Secondary | ICD-10-CM | POA: Diagnosis not present

## 2017-03-08 DIAGNOSIS — Z23 Encounter for immunization: Secondary | ICD-10-CM

## 2017-03-08 DIAGNOSIS — R87612 Low grade squamous intraepithelial lesion on cytologic smear of cervix (LGSIL): Secondary | ICD-10-CM

## 2017-03-08 DIAGNOSIS — I10 Essential (primary) hypertension: Secondary | ICD-10-CM

## 2017-03-08 DIAGNOSIS — R87618 Other abnormal cytological findings on specimens from cervix uteri: Secondary | ICD-10-CM

## 2017-03-08 DIAGNOSIS — Z124 Encounter for screening for malignant neoplasm of cervix: Secondary | ICD-10-CM

## 2017-03-08 DIAGNOSIS — R300 Dysuria: Secondary | ICD-10-CM | POA: Diagnosis not present

## 2017-03-08 DIAGNOSIS — D508 Other iron deficiency anemias: Secondary | ICD-10-CM

## 2017-03-08 LAB — POCT URINALYSIS DIP (MANUAL ENTRY)
BILIRUBIN UA: NEGATIVE
BILIRUBIN UA: NEGATIVE mg/dL
GLUCOSE UA: NEGATIVE mg/dL
NITRITE UA: NEGATIVE
PH UA: 6 (ref 5.0–8.0)
Protein Ur, POC: NEGATIVE mg/dL
RBC UA: NEGATIVE
Spec Grav, UA: 1.015 (ref 1.010–1.025)
Urobilinogen, UA: 0.2 E.U./dL

## 2017-03-08 MED ORDER — LOSARTAN POTASSIUM 100 MG PO TABS: 100.0000 mg | ORAL_TABLET | Freq: Every day | ORAL | 3 refills | Status: DC

## 2017-03-08 NOTE — Patient Instructions (Signed)
     IF you received an x-ray today, you will receive an invoice from [facility]. Please contact [facility] at [phone] with questions or concerns regarding your invoice.   IF you received labwork today, you will receive an invoice from [facility]. Please contact [facility] at [phone] with questions or concerns regarding your invoice.   Our billing staff will not be able to assist you with questions regarding bills from these companies.  You will be contacted with the lab results as soon as they are available. The fastest way to get your results is to activate your My Chart account. Instructions are located on the last page of this paperwork. If you have not heard from us regarding the results in 2 weeks, please contact this office.     

## 2017-03-08 NOTE — Progress Notes (Signed)
Chief Complaint  Patient presents with  . Abdominal Pain    x 1 month no pain w/ pooping only pain w/ urination.  Not taking any otc meds for pain    HPI  Pt reports that her lower abdominal wall on the right side It hurts all the time now but a week ago it was hurting on the right lower quadrant She reports that after her period she notices her pain would intensify 2 weeks after her period and before the next one starts. Patient's last menstrual period was 02/14/2017. Her pain is 9/10 She reports that her pain is sharp and sometimes radiates to the back like a belt wrapping around  Wt Readings from Last 3 Encounters:  03/08/17 219 lb 12.8 oz (99.7 kg)  12/26/16 218 lb (98.9 kg)  11/21/16 220 lb (99.8 kg)   Hypertension: Patient here for follow-up of elevated blood pressure. She is not exercising and is adherent to low salt diet.  Blood pressure is well controlled at home. Cardiac symptoms none. Patient denies chest pain, dyspnea, fatigue, orthopnea and palpitations.  Cardiovascular risk factors: dyslipidemia, hypertension and obesity (BMI >= 30 kg/m2). Use of agents associated with hypertension: none. History of target organ damage: none. BP Readings from Last 3 Encounters:  03/08/17 138/88  11/21/16 135/83  10/26/16 130/80   Anemia She has a history of IDA She has not been taking her iron supplements She reports that she only takes it when she gets tired or dizzy She denise current palpitations, dizziness She reports fatigue Lab Results  Component Value Date   WBC 5.2 10/24/2016   HGB 12.6 10/24/2016   HCT 37.6 (A) 10/24/2016   MCV 90.7 10/24/2016   PLT 230 08/19/2016    Past Medical History:  Diagnosis Date  . Anemia   . Fatty liver   . H. pylori infection   . HTN (hypertension)   . Hx of cardiovascular stress test    a. ETT-Myoview 05/28/12: Low risk study with a small, mild fixed basal inferior perfusion defect most likely representing soft tissue attenuation  with inferolateral HK (wall motion abnormality does not correlate with inferior perfusion defect), EF 49%.  Marland Kitchen Hx of echocardiogram    a. Echocardiogram 06/11/12: EF 50-55%, normal wall motion, grade 1 diastolic dysfunction, trivial AI, mild MR, mild LAE, mild RAE.  . RUQ pain     Current Outpatient Prescriptions  Medication Sig Dispense Refill  . ferrous sulfate 325 (65 FE) MG tablet Take 1 tablet (325 mg total) by mouth daily with breakfast. 90 tablet 3  . losartan (COZAAR) 100 MG tablet Take 1 tablet (100 mg total) by mouth daily. 90 tablet 3  . omeprazole (PRILOSEC) 40 MG capsule Take 1 capsule (40 mg total) by mouth daily. 30 capsule 3   No current facility-administered medications for this visit.     Allergies:  Allergies  Allergen Reactions  . Hctz [Hydrochlorothiazide]     Heart palpitations    Past Surgical History:  Procedure Laterality Date  . NO PAST SURGERIES      Social History   Social History  . Marital status: Married    Spouse name: N/A  . Number of children: 4  . Years of education: N/A   Occupational History  . Housekeeper     at hotel  .  [employer]   Social History Main Topics  . Smoking status: Never Smoker  . Smokeless tobacco: Never Used  . Alcohol use No  . Drug use:  No  . Sexual activity: No   Other Topics Concern  . None   Social History Narrative  . None    ROS See hpi  Objective: Vitals:   03/08/17 0926  BP: 138/88  Pulse: 80  Resp: 17  Temp: 98.3 F (36.8 C)  TempSrc: Oral  SpO2: 98%  Weight: 219 lb 12.8 oz (99.7 kg)  Height: 5\' 4"  (1.626 m)    Physical Exam  Abdominal:     Physical Exam  Constitutional: She is oriented to person, place, and time. She appears well-developed and well-nourished.  HENT:  Head: Normocephalic and atraumatic.  Eyes: Conjunctivae and EOM are normal.  Cardiovascular: Normal rate, regular rhythm and normal heart sounds.   Pulmonary/Chest: Effort normal and breath sounds  normal. No respiratory distress. She has no wheezes.  Abdominal: Normal appearance and bowel sounds are normal. There is no tenderness. There is no CVA tenderness. Palpable bulge on the Right lower abdomen  Neurological: She is alert and oriented to person, place, and time.   Vaginal exam- chaperone present Labia normal bilaterally without skin lesions Urethral meatus normal appearing without erythema Vagina without discharge No CMT, ovaries small and not palpable Uterus palpable, fundus oriented toward the patient's right hip near the area Pap smear performed   Ct Abdomen  09/19/16 Right ovary measures 4.1cm  Advised follow up US 6-8 weeks   Assessment and Plan [name] [name] was seen today for abdominal pain.  Diagnoses and all orders for this visit:  Dysuria- negative -     POCT urinalysis dipstick  Abdominal wall pain in right lower quadrant- advised to follow up for right ovarian cyst-  -     [place] Art/Ven Flow Abd Pelv Doppler -     CBC  Ovarian cyst, right- could be a right ovarian cyst could be causing pain and if she has a uterine fibroid  -     [place] Art/Ven Flow Abd Pelv Doppler -     CBC  Need for prophylactic vaccination and inoculation against influenza -     Flu Vaccine QUAD 36+ mos IM  Need for diphtheria-tetanus-pertussis (Tdap) vaccine -     Tdap vaccine greater than or equal to 7yo IM  Essential hypertension- bp at goal, will refill meds and check renal function -     losartan (COZAAR) 100 MG tablet; Take 1 tablet (100 mg total) by mouth daily. -     Basic metabolic panel  Pap smear for cervical cancer screening- discussed cervical screening -     Pap IG, CT/NG NAA, and HPV (high risk)  Iron deficiency anemia secondary to inadequate dietary iron intake- will recheck  Other orders -     Cancel: Tdap vaccine greater than or equal to 7yo IM -     Cancel: Flu Vaccine QUAD 36+ mos IM     [name] [name]

## 2017-03-09 LAB — BASIC METABOLIC PANEL
BUN/Creatinine Ratio: 13 (ref 9–23)
BUN: 10 mg/dL (ref 6–24)
CALCIUM: 9 mg/dL (ref 8.7–10.2)
CO2: 18 mmol/L — ABNORMAL LOW (ref 20–29)
Chloride: 104 mmol/L (ref 96–106)
Creatinine, Ser: 0.77 mg/dL (ref 0.57–1.00)
GFR calc non Af Amer: 92 mL/min/{1.73_m2} (ref >59)
GFR, EST AFRICAN AMERICAN: 106 mL/min/{1.73_m2} (ref >59)
Glucose: 84 mg/dL (ref 65–99)
POTASSIUM: 4 mmol/L (ref 3.5–5.2)
SODIUM: 137 mmol/L (ref 134–144)

## 2017-03-09 LAB — CBC
Hematocrit: 33.3 % — ABNORMAL LOW (ref 34.0–46.6)
Hemoglobin: 10.4 g/dL — ABNORMAL LOW (ref 11.1–15.9)
MCH: 27.2 pg (ref 26.6–33.0)
MCHC: 31.2 g/dL — AB (ref 31.5–35.7)
MCV: 87 fL (ref 79–97)
PLATELETS: 196 10*3/uL (ref 150–379)
RBC: 3.82 x10E6/uL (ref 3.77–5.28)
RDW: 14.1 % (ref 12.3–15.4)
WBC: 2.3 10*3/uL — CL (ref 3.4–10.8)

## 2017-03-12 LAB — PAP IG, CT-NG NAA, HPV HIGH-RISK
Chlamydia, Nuc. Acid Amp: NEGATIVE
GONOCOCCUS BY NUCLEIC ACID AMP: NEGATIVE
HPV, high-risk: POSITIVE — AB
PAP SMEAR COMMENT: 0

## 2017-03-14 ENCOUNTER — Telehealth: Payer: Self-pay | Admitting: Family Medicine

## 2017-03-14 NOTE — Telephone Encounter (Signed)
Spoke with pt advised referral sent over and in process of scheduling appt.  She will hear from [facility] with appt date and time.  Pt agreeable. Dgaddy, CMA

## 2017-03-14 NOTE — Telephone Encounter (Signed)
Spoke to patient's husband to tell him to call the office.  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 This needs to be followed up with gynecology to do a biopsy to see what the actual issue is. This is commonly called dysplasia.  She also has low iron and I really think she has a fibroid or a concerning cyst. I sent the referral to the referral pool.  Thank you.

## 2017-03-14 NOTE — Telephone Encounter (Signed)
Pt calling back stating that Dr Nolon Rod had call her please give pt message that Dr Bridget Hartshorn left for her

## 2017-03-14 NOTE — Telephone Encounter (Signed)
Referral has been sent to [facility] on 9/12. They are in the process of working to get pt scheduled. Thanks!

## 2017-03-14 NOTE — Addendum Note (Signed)
Addended by: Delia Chimes A on: 03/14/2017 11:34 AM   Modules accepted: Orders

## 2017-03-15 ENCOUNTER — Encounter: Payer: Self-pay | Admitting: Family Medicine

## 2017-03-19 ENCOUNTER — Telehealth: Payer: Self-pay | Admitting: Obstetrics & Gynecology

## 2017-03-19 NOTE — Telephone Encounter (Signed)
Recall placed for 04/2017. Will close encounter.

## 2017-03-19 NOTE — Telephone Encounter (Signed)
Spoke with patient and transferring call to Tania Ade for scheduling.

## 2017-03-19 NOTE — Telephone Encounter (Signed)
Yes, place recall for 10/18 to make sure she comes for this appt.. Thanks.  Ok to close encounter.

## 2017-03-19 NOTE — Telephone Encounter (Signed)
Spoke with patient. Referral from PCP for colposcopy with Dr. 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   LMP 03/11/17. Currently SA, denies condom use, contraceptives.   Advised patient will need to return call to office first day of menses to schedule colposcopy. Advised patient would review with Dr. Sabra Heck and return call with any additional recommendations. Patient verbalizes understanding and is agreeable.  Last OV 11/26/13 -Dr. Sabra Heck  Dr. Sabra Heck -routing [place], would you like to place patient in recall for 04/2017?

## 2017-04-10 ENCOUNTER — Ambulatory Visit (INDEPENDENT_AMBULATORY_CARE_PROVIDER_SITE_OTHER): Payer: 59 | Admitting: Obstetrics & Gynecology

## 2017-04-10 ENCOUNTER — Other Ambulatory Visit: Payer: Self-pay | Admitting: Obstetrics & Gynecology

## 2017-04-10 ENCOUNTER — Encounter: Payer: Self-pay | Admitting: Obstetrics & Gynecology

## 2017-04-10 VITALS — BP 140/92 | HR 78 | Resp 16 | Ht 64.0 in | Wt 220.0 lb

## 2017-04-10 DIAGNOSIS — Z01812 Encounter for preprocedural laboratory examination: Secondary | ICD-10-CM | POA: Diagnosis not present

## 2017-04-10 DIAGNOSIS — R87612 Low grade squamous intraepithelial lesion on cytologic smear of cervix (LGSIL): Secondary | ICD-10-CM

## 2017-04-10 DIAGNOSIS — N870 Mild cervical dysplasia: Secondary | ICD-10-CM | POA: Diagnosis not present

## 2017-04-10 DIAGNOSIS — N920 Excessive and frequent menstruation with regular cycle: Secondary | ICD-10-CM | POA: Diagnosis not present

## 2017-04-10 LAB — POCT URINE PREGNANCY: PREG TEST UR: NEGATIVE

## 2017-04-10 NOTE — Patient Instructions (Signed)

## 2017-04-10 NOTE — Progress Notes (Signed)
46 y.o. Married AA female here for further evaluation with colposcopy with possible biopsis and/or ECC due to LGSIL Pap with +HR HPV obtained 03/08/17 with Dr. Delia Chimes.  Pt was referred for additional evaluation.  I have seen pt in the past, >3 years ago, due new onset menorrhagia (at that time).  Pt and I discussed treatment options but she never follow-up.  She reports [facility]'s had several ultrasound and a biopsy but "nothing has been done" so she would like to discuss this again today.  Pt feels endometrial biopsy and ultrasound has been done within the last few months.    Patient's last menstrual period was 04/03/2017.          Sexually active: Yes.    The current method of family planning is none.     Patient has been counseled about results and procedure.  Risks and benefits have bene reviewed including immediate and/or delayed bleeding, infection, cervical scaring from procedure, possibility of needing additional follow up as well as treatment.  rare risks of missing a lesion discussed as well.  All questions answered.  Pt ready to proceed.  BP (!) 140/92 (BP Location: Right Arm, Patient Position: Sitting, Cuff Size: Large)   Pulse 78   Resp 16   Ht 5\' 4"  (1.626 m)   Wt 220 lb (99.8 kg)   LMP 04/03/2017   BMI 37.76 kg/m   Physical Exam  Constitutional: She appears well-developed and well-nourished.  Genitourinary: Vagina normal. There is no rash, tenderness, lesion or injury on the right labia. There is no rash, tenderness, lesion or injury on the left labia.    Lymphadenopathy:       Right: No inguinal adenopathy present.       Left: No inguinal adenopathy present.    Speculum placed.  3% acetic acid applied to cervix for >45 seconds.  Cervix visualized with both 7.5X and 15X magnification.  Green filter also used.  Lugols solution was used.  Findings:  No AWE but small area of decreased staining located at 11 o'clock position.   Biopsy:  11 o'clock.  ECC:  was performed.  Monsel's was needed.  Excellent hemostasis was present.  Pt tolerated procedure well and all instruments were removed.  Findings noted above on picture of cervix.  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 Release of records signed as I hope to not repeat any tests already done.  Will proceed with recommendations for bleeding once I get copies of her results/tests.

## 2017-04-10 NOTE — Progress Notes (Deleted)
GYNECOLOGY  VISIT  CC:   ***  HPI: 46 y.o. [id number] Married [place] American female here for ***.  GYNECOLOGIC HISTORY: Patient's last menstrual period was 04/03/2017. Contraception: none Menopausal hormone therapy: none  Patient Active Problem List   Diagnosis Date Noted  . Lung nodule 08/26/2016  . PVC (premature ventricular contraction) 05/26/2014  . Diverticulosis 04/14/2014  . GERD (gastroesophageal reflux disease) 11/19/2012  . Chest pain 05/21/2012  . Obesity, morbid (more than 100 lbs over ideal weight or BMI > 40) ([place]) 05/30/2011  . Essential hypertension 04/29/2009    Past Medical History:  Diagnosis Date  . Anemia   . Fatty liver   . H. pylori infection   . HTN (hypertension)   . Hx of cardiovascular stress test    a. ETT-Myoview 05/28/12: Low risk study with a small, mild fixed basal inferior perfusion defect most likely representing soft tissue attenuation with inferolateral HK (wall motion abnormality does not correlate with inferior perfusion defect), EF 49%.  Marland Kitchen Hx of echocardiogram    a. Echocardiogram 06/11/12: EF 50-55%, normal wall motion, grade 1 diastolic dysfunction, trivial AI, mild MR, mild LAE, mild RAE.  . RUQ pain     Past Surgical History:  Procedure Laterality Date  . NO PAST SURGERIES      MEDS:   Current Outpatient Prescriptions on File Prior to Visit  Medication Sig Dispense Refill  . ferrous sulfate 325 (65 FE) MG tablet Take 1 tablet (325 mg total) by mouth daily with breakfast. 90 tablet 3  . losartan (COZAAR) 100 MG tablet Take 1 tablet (100 mg total) by mouth daily. 90 tablet 3  . omeprazole (PRILOSEC) 40 MG capsule Take 1 capsule (40 mg total) by mouth daily. 30 capsule 3   No current facility-administered medications on file prior to visit.     ALLERGIES: Hctz [hydrochlorothiazide]  Family History  Problem Relation Age of Onset  . Coronary artery disease Mother   . Other Brother        fluid around heart  . Other Other         hepatic issues-? maternal side  . Stomach cancer Neg Hx   . Colon cancer Neg Hx     SH:  ***  Review of Systems  Gastrointestinal: Positive for abdominal pain.  All other systems reviewed and are negative.   PHYSICAL EXAMINATION:    BP (!) 140/92 (BP Location: Right Arm, Patient Position: Sitting, Cuff Size: Large)   Pulse 78   Resp 16   Ht 5\' 4"  (1.626 m)   Wt 220 lb (99.8 kg)   LMP 04/03/2017   BMI 37.76 kg/m     General appearance: alert, cooperative and appears stated age Neck: no adenopathy, supple, symmetrical, trachea midline and thyroid {CHL AMB PHY EX THYROID NORM [phone]::"normal to inspection and palpation"} CV:  {Exam; heart brief:31539} Lungs:  {pe lungs ob:314451::"clear to auscultation, no wheezes, rales or rhonchi, symmetric air entry"} Breasts: {Exam; breast:13139::"normal appearance, no masses or tenderness"} Abdomen: soft, non-tender; bowel sounds normal; no masses,  no organomegaly  Pelvic: External genitalia:  no lesions              Urethra:  normal appearing urethra with no masses, tenderness or lesions              Bartholins and Skenes: normal                 Vagina: normal appearing vagina with normal color and discharge, no lesions  Cervix: {CHL AMB PHY EX CERVIX NORM DEFAULT:[phone]::"no lesions"}              Bimanual Exam:  Uterus:  {CHL AMB PHY EX UTERUS NORM DEFAULT:[phone]::"normal size, contour, position, consistency, mobility, non-tender"}              Adnexa: {CHL AMB PHY EX ADNEXA NO MASS [phone]::"no mass, fullness, tenderness"}              Rectovaginal: {yes no:314532}.  Confirms.              Anus:  normal sphincter tone, no lesions  Chaperone was present for exam.  Assessment: ***  Plan: ***   ~{NUMBERS; -10-45 JOINT ROM:10287} minutes spent with patient >50% of time was in face to face discussion of above.

## 2017-04-13 ENCOUNTER — Encounter: Payer: Self-pay | Admitting: Obstetrics & Gynecology

## 2017-04-13 NOTE — Addendum Note (Signed)
Addended by: Megan Salon on: 04/13/2017 05:16 PM   Modules accepted: Level of Service

## 2017-05-08 ENCOUNTER — Other Ambulatory Visit: Payer: Self-pay | Admitting: Family Medicine

## 2017-05-08 ENCOUNTER — Ambulatory Visit (HOSPITAL_COMMUNITY): Payer: 59

## 2017-05-08 ENCOUNTER — Ambulatory Visit (HOSPITAL_COMMUNITY)
Admission: RE | Admit: 2017-05-08 | Discharge: 2017-05-08 | Disposition: A | Discharge status: Home / Self Care | Payer: 59 | Source: Ambulatory Visit | Attending: Family Medicine | Admitting: Family Medicine

## 2017-05-08 ENCOUNTER — Telehealth: Payer: Self-pay

## 2017-05-08 ENCOUNTER — Other Ambulatory Visit: Payer: Self-pay

## 2017-05-08 DIAGNOSIS — N83201 Unspecified ovarian cyst, right side: Secondary | ICD-10-CM | POA: Diagnosis not present

## 2017-05-08 DIAGNOSIS — R1031 Right lower quadrant pain: Secondary | ICD-10-CM

## 2017-05-08 NOTE — Telephone Encounter (Signed)
Error

## 2017-05-14 ENCOUNTER — Ambulatory Visit: Payer: 59 | Admitting: Family Medicine

## 2017-05-16 ENCOUNTER — Ambulatory Visit: Payer: 59 | Admitting: Family Medicine

## 2017-06-12 ENCOUNTER — Encounter: Payer: Self-pay | Admitting: Urgent Care

## 2017-06-12 ENCOUNTER — Ambulatory Visit: Payer: 59 | Admitting: Urgent Care

## 2017-06-12 VITALS — BP 150/92 | HR 78 | Temp 99.1°F | Ht 64.0 in | Wt 218.0 lb

## 2017-06-12 DIAGNOSIS — M25561 Pain in right knee: Secondary | ICD-10-CM | POA: Diagnosis not present

## 2017-06-12 DIAGNOSIS — M62838 Other muscle spasm: Secondary | ICD-10-CM | POA: Diagnosis not present

## 2017-06-12 DIAGNOSIS — M542 Cervicalgia: Secondary | ICD-10-CM

## 2017-06-12 DIAGNOSIS — M5412 Radiculopathy, cervical region: Secondary | ICD-10-CM

## 2017-06-12 MED ORDER — CYCLOBENZAPRINE HCL 5 MG PO TABS: 5.0000 mg | ORAL_TABLET | Freq: Three times a day (TID) | ORAL | 1 refills | Status: DC | PRN

## 2017-06-12 MED ORDER — PREDNISONE 20 MG PO TABS
ORAL_TABLET | ORAL | 0 refills | Status: DC
Start: 2017-06-12 — End: 2017-09-08

## 2017-06-12 NOTE — Progress Notes (Signed)
  MRN: [id number] DOB: October 05, 1970  Subjective:   [name] [name] is a 46 y.o. female presenting for reports 4 day history of right upper back pain between shoulder and neck. Pain is constant, like a tightness, shoots down into her right hand and right ear. Has tried ibuprofen every day. Patient works in housekeeping. Denies falls, trauma, history of arthritis. Has also had 2 day history of right knee pain. Her knee pain is posterior, feels like a cramp, is intermittent. Denies redness, swelling, warmth, fever.   Vernelle has a current medication list which includes the following prescription(s): ferrous sulfate, losartan, and omeprazole. Also is allergic to hctz [hydrochlorothiazide].  [name]  has a past medical history of Anemia, Fatty liver, H. pylori infection, HTN (hypertension), cardiovascular stress test, echocardiogram, and RUQ pain. Also  has a past surgical history that includes No past surgeries.  Objective:   Vitals: BP (!) 150/92 (BP Location: Left Arm, Patient Position: Sitting, Cuff Size: Normal)   Pulse 78   Temp 99.1 F (37.3 C)   Ht 5\' 4"  (1.626 m)   Wt 218 lb (98.9 kg)   SpO2 100%   BMI 37.42 kg/m   Physical Exam  Constitutional: She is oriented to person, place, and time. She appears well-developed and well-nourished.  Cardiovascular: Normal rate.  Pulmonary/Chest: Effort normal.  Musculoskeletal:       Right shoulder: She exhibits normal range of motion, no tenderness, no bony tenderness, no swelling, no effusion, no crepitus, no deformity, no laceration, no pain, no spasm and normal strength.       Right knee: She exhibits normal range of motion, no swelling, no effusion, no ecchymosis, no deformity, no laceration, no erythema, normal alignment, normal patellar mobility and no bony tenderness. No tenderness found.       Cervical back: She exhibits decreased range of motion (flexion, extension), tenderness (over paraspinal muscles, positive radicular pain to the  right) and spasm (over right trapezius). She exhibits no bony tenderness, no swelling, no edema, no deformity and no laceration.       Thoracic back: She exhibits normal range of motion, no tenderness, no bony tenderness, no swelling, no edema, no deformity, no laceration, no pain, no spasm and normal pulse.  Neurological: She is alert and oriented to person, place, and time.    Assessment and Plan :   1. Neck pain 2. Cervical radiculopathy 3. Trapezius muscle spasm 4. Acute pain of right knee - Start short steroid course. Return-to-clinic precautions discussed, patient verbalized understanding. Will obtain cervical x-ray at follow up.   Jaynee Eagles, PA-C [facility] [phone] 06/12/2017  3:59 PM

## 2017-06-12 NOTE — Patient Instructions (Signed)
Cervical Radiculopathy Cervical radiculopathy happens when a nerve in the neck (cervical nerve) is pinched or bruised. This condition can develop because of an injury or as part of the normal aging process. Pressure on the cervical nerves can cause pain or numbness that runs from the neck all the way down into the arm and fingers. Usually, this condition gets better with rest. Treatment may be needed if the condition does not improve. What are the causes? This condition may be caused by:  Injury.  Slipped (herniated) disk.  Muscle tightness in the neck because of overuse.  Arthritis.  Breakdown or degeneration in the bones and joints of the spine (spondylosis) due to aging.  Bone spurs that may develop near the cervical nerves.  What are the signs or symptoms? Symptoms of this condition include:  Pain that runs from the neck to the arm and hand. The pain can be severe or irritating. It may be worse when the neck is moved.  Numbness or weakness in the affected arm and hand.  How is this diagnosed? This condition may be diagnosed based on symptoms, medical history, and a physical exam. You may also have tests, including:  X-rays.  CT scan.  MRI.  Electromyogram (EMG).  Nerve conduction tests.  How is this treated? In many cases, treatment is not needed for this condition. With rest, the condition usually gets better over time. If treatment is needed, options may include:  Wearing a soft neck collar for short periods of time.  Physical therapy to strengthen your neck muscles.  Medicines, such as NSAIDs, oral corticosteroids, or spinal injections.  Surgery. This may be needed if other treatments do not help. Various types of surgery may be done depending on the cause of your problems.  Follow these instructions at home: Managing pain  Take over-the-counter and prescription medicines only as told by your health care provider.  If directed, apply ice to the affected  area. ? Put ice in a plastic bag. ? Place a towel between your skin and the bag. ? Leave the ice on for 20 minutes, 2-3 times per day.  If ice does not help, you can try using heat. Take a warm shower or warm bath, or use a heat pack as told by your health care provider.  Try a gentle neck and shoulder massage to help relieve symptoms. Activity  Rest as needed. Follow instructions from your health care provider about any restrictions on activities.  Do stretching and strengthening exercises as told by your health care provider or physical therapist. General instructions  If you were given a soft collar, wear it as told by your health care provider.  Use a flat pillow when you sleep.  Keep all follow-up visits as told by your health care provider. This is important. Contact a health care provider if:  Your condition does not improve with treatment. Get help right away if:  Your pain gets much worse and cannot be controlled with medicines.  You have weakness or numbness in your hand, arm, face, or leg.  You have a high fever.  You have a stiff, rigid neck.  You lose control of your bowels or your bladder (have incontinence).  You have trouble with walking, balance, or speaking. This information is not intended to replace advice given to you by your health care provider. Make sure you discuss any questions you have with your health care provider. Document Released: 03/14/2001 Document Revised: 11/25/2015 Document Reviewed: 08/13/2014 Elsevier Interactive Patient Education    2018 Elsevier Inc.  

## 2017-06-22 ENCOUNTER — Encounter: Payer: 59 | Admitting: Obstetrics & Gynecology

## 2017-09-08 ENCOUNTER — Ambulatory Visit: Payer: 59 | Admitting: Family Medicine

## 2017-09-08 ENCOUNTER — Encounter: Payer: Self-pay | Admitting: Family Medicine

## 2017-09-08 VITALS — BP 164/101 | HR 73 | Temp 99.0°F | Resp 18 | Ht 64.0 in | Wt 213.6 lb

## 2017-09-08 DIAGNOSIS — R300 Dysuria: Secondary | ICD-10-CM | POA: Diagnosis not present

## 2017-09-08 DIAGNOSIS — R1031 Right lower quadrant pain: Secondary | ICD-10-CM

## 2017-09-08 DIAGNOSIS — I10 Essential (primary) hypertension: Secondary | ICD-10-CM

## 2017-09-08 LAB — POCT CBC
Granulocyte percent: 57.2 %G (ref 37–80)
HCT, POC: 30.6 % — AB (ref 37.7–47.9)
Hemoglobin: 9.5 g/dL — AB (ref 12.2–16.2)
Lymph, poc: 1.7 (ref 0.6–3.4)
MCH, POC: 26.3 pg — AB (ref 27–31.2)
MCHC: 31.2 g/dL — AB (ref 31.8–35.4)
MCV: 84.3 fL (ref 80–97)
MID (cbc): 0.1 (ref 0–0.9)
MPV: 9.6 fL (ref 0–99.8)
POC Granulocyte: 2.4 (ref 2–6.9)
POC LYMPH PERCENT: 41.2 %L (ref 10–50)
POC MID %: 1.6 %M (ref 0–12)
Platelet Count, POC: 221 10*3/uL (ref 142–424)
RBC: 3.63 M/uL — AB (ref 4.04–5.48)
RDW, POC: 15.2 %
WBC: 4.2 10*3/uL — AB (ref 4.6–10.2)

## 2017-09-08 LAB — POC MICROSCOPIC URINALYSIS (UMFC): Mucus: ABSENT

## 2017-09-08 LAB — POCT URINALYSIS DIP (MANUAL ENTRY)
Bilirubin, UA: NEGATIVE
Blood, UA: NEGATIVE
Glucose, UA: NEGATIVE mg/dL
Ketones, POC UA: NEGATIVE mg/dL
Nitrite, UA: NEGATIVE
Protein Ur, POC: NEGATIVE mg/dL
Spec Grav, UA: 1.015 (ref 1.010–1.025)
Urobilinogen, UA: 0.2 E.U./dL
pH, UA: 7 (ref 5.0–8.0)

## 2017-09-08 MED ORDER — AMLODIPINE BESYLATE 5 MG PO TABS: 5.0000 mg | ORAL_TABLET | Freq: Every day | ORAL | 2 refills | Status: DC

## 2017-09-08 NOTE — Progress Notes (Signed)
3/9/20193:08 PM  [name] [name] 10/15/1970, 47 y.o. female [id number]  Chief Complaint  Patient presents with  . Abdominal Pain    Right lower quadrant, radiates to back  . Dysuria    HPI:   Patient is a 47 y.o. female with past medical history significant for HTN and chronic RLQ pain of unclear etiology who presents today for new onset dysuria and pain that is radiating to the back . She denies any blood in her urine. She denies colicky nature of pain. She denies any h/o kidney stones. She does suffer from some constipation. She denies any fever,chills, nausea or vomiting. She denies any abnormal vaginal discharge.   Pelvic US in 05/2017 - normal Ct abd/pelvis 08/2016 -  1. There is no evidence of acute inflammatory process within abdomen. Mild fatty infiltration of the liver. No calcified gallstones are noted within gallbladder. 2. Normal appendix. No pericecal inflammation. Moderate stool and some contrast material noted within right colon and transverse colon. No small bowel or colonic obstruction. No distal colitis or diverticulitis. 3. Probable mild hemorrhagic cyst within right ovary measures 4.1 cm. Follow-up pelvic ultrasound is recommended in 6-8 weeks to assure resolution or stability. No pelvic free fluid. Uterus is anteflexed. 4. No hydronephrosis or hydroureter.  No nephrolithiasis. 5. Degenerative changes lower thoracic spine.  Depression screen [facility] 2/9 09/08/2017 03/08/2017 11/21/2016  Decreased Interest 0 0 0  Down, Depressed, Hopeless 0 0 0  PHQ - 2 Score 0 0 0    Allergies  Allergen Reactions  . Hctz [Hydrochlorothiazide]     Heart palpitations    Prior to Admission medications   Medication Sig Start Date End Date Taking? Authorizing Provider  losartan (COZAAR) 100 MG tablet Take 1 tablet (100 mg total) by mouth daily. 03/08/17  Yes Forrest Moron, MD  ferrous sulfate 325 (65 FE) MG tablet Take 1 tablet (325 mg total) by mouth daily with  breakfast. Patient not taking: Reported on 09/08/2017 08/22/16   Scot Jun, FNP  omeprazole (PRILOSEC) 40 MG capsule Take 1 capsule (40 mg total) by mouth daily. Patient not taking: Reported on 09/08/2017 09/13/16   Scot Jun, FNP    Past Medical History:  Diagnosis Date  . Anemia   . Fatty liver   . H. pylori infection   . HTN (hypertension)   . Hx of cardiovascular stress test    a. ETT-Myoview 05/28/12: Low risk study with a small, mild fixed basal inferior perfusion defect most likely representing soft tissue attenuation with inferolateral HK (wall motion abnormality does not correlate with inferior perfusion defect), EF 49%.  Marland Kitchen Hx of echocardiogram    a. Echocardiogram 06/11/12: EF 50-55%, normal wall motion, grade 1 diastolic dysfunction, trivial AI, mild MR, mild LAE, mild RAE.  . RUQ pain     Past Surgical History:  Procedure Laterality Date  . NO PAST SURGERIES      Social History   Tobacco Use  . Smoking status: Never Smoker  . Smokeless tobacco: Never Used  Substance Use Topics  . Alcohol use: No    Family History  Problem Relation Age of Onset  . Coronary artery disease Mother   . Other Brother        fluid around heart  . Other Other        hepatic issues-? maternal side  . Stomach cancer Neg Hx   . Colon cancer Neg Hx     ROS Per hpi  OBJECTIVE:  Blood pressure Marland Kitchen)  164/101, pulse 73, temperature 99 F (37.2 C), temperature source Oral, resp. rate 18, height 5\' 4"  (1.626 m), weight 213 lb 9.6 oz (96.9 kg), last menstrual period 08/27/2017, SpO2 99 %.  BP Readings from Last 3 Encounters:  09/08/17 (!) 164/101  06/12/17 (!) 150/92  04/10/17 (!) 140/92    Physical Exam  Constitutional: She is oriented to person, place, and time and well-developed, well-nourished, and in no distress.  HENT:  Head: Normocephalic and atraumatic.  Mouth/Throat: Oropharynx is clear and moist. No oropharyngeal exudate.  Eyes: EOM are normal. Pupils are  equal, round, and reactive to light. No scleral icterus.  Neck: Neck supple.  Cardiovascular: Normal rate, regular rhythm and normal heart sounds. Exam reveals no gallop and no friction rub.  No murmur heard. Pulmonary/Chest: Effort normal and breath sounds normal. She has no wheezes. She has no rales.  Abdominal: Soft. Bowel sounds are normal. There is no hepatosplenomegaly. There is tenderness in the right lower quadrant. There is no rebound, no guarding and no CVA tenderness.  Musculoskeletal: She exhibits no edema.       Lumbar back: She exhibits no tenderness, no bony tenderness and no spasm.  Neurological: She is alert and oriented to person, place, and time. Gait normal.  Skin: Skin is warm and dry.     Results for orders placed or performed in visit on 09/08/17 (from the past 24 hour(s))  POCT urinalysis dipstick     Status: Abnormal   Collection Time: 09/08/17  3:19 PM  Result Value Ref Range   Color, UA yellow yellow   Clarity, UA clear clear   Glucose, UA negative negative mg/dL   Bilirubin, UA negative negative   Ketones, POC UA negative negative mg/dL   Spec Grav, UA 1.015 1.010 - 1.025   Blood, UA negative negative   pH, UA 7.0 5.0 - 8.0   Protein Ur, POC negative negative mg/dL   Urobilinogen, UA 0.2 0.2 or 1.0 E.U./dL   Nitrite, UA Negative Negative   Leukocytes, UA Trace (A) Negative  POCT Microscopic Urinalysis ([facility])     Status: None   Collection Time: 09/08/17  3:29 PM  Result Value Ref Range   WBC,UR,HPF,POC None None WBC/hpf   RBC,UR,HPF,POC None None RBC/hpf   Bacteria None None, Too numerous to count   Mucus Absent Absent   Epithelial Cells, UR Per Microscopy None None, Too numerous to count cells/hpf  POCT CBC     Status: Abnormal   Collection Time: 09/08/17  3:32 PM  Result Value Ref Range   WBC 4.2 (A) 4.6 - 10.2 K/uL   Lymph, poc 1.7 0.6 - 3.4   POC LYMPH PERCENT 41.2 10 - 50 %L   MID (cbc) 0.1 0 - 0.9   POC MID % 1.6 0 - 12 %M   POC Granulocyte  2.4 2 - 6.9   Granulocyte percent 57.2 37 - 80 %G   RBC 3.63 (A) 4.04 - 5.48 M/uL   Hemoglobin 9.5 (A) 12.2 - 16.2 g/dL   HCT, POC 30.6 (A) 37.7 - 47.9 %   MCV 84.3 80 - 97 fL   MCH, POC 26.3 (A) 27 - 31.2 pg   MCHC 31.2 (A) 31.8 - 35.4 g/dL   RDW, POC 15.2 %   Platelet Count, POC 221 142 - 424 K/uL   MPV 9.6 0 - 99.8 fL    ASSESSMENT and PLAN  1. Dysuria ua normal. Discussed increasing hydration and RTC precautions.  - POCT urinalysis  dipstick - POCT Microscopic Urinalysis ([facility])  2. RLQ abdominal pain Chronic, negative workup so far, fu with PCP - POCT CBC - Comprehensive metabolic panel  3. Essential hypertension, benign Uncontrolled, adding amlodipine, new med r/se/b discussed - Comprehensive metabolic panel - TSH  Other orders - amLODipine (NORVASC) 5 MG tablet; Take 1 tablet (5 mg total) by mouth daily. - Comprehensive metabolic panel  Return in about 4 weeks (around 10/06/2017).    Rutherford Guys, MD [facility] at [place], [place] [address] Ph.  [phone] Fax [phone]

## 2017-09-08 NOTE — Patient Instructions (Signed)
     IF you received an x-ray today, you will receive an invoice from [facility]. Please contact [facility] at [phone] with questions or concerns regarding your invoice.   IF you received labwork today, you will receive an invoice from [facility]. Please contact [facility] at [phone] with questions or concerns regarding your invoice.   Our billing staff will not be able to assist you with questions regarding bills from these companies.  You will be contacted with the lab results as soon as they are available. The fastest way to get your results is to activate your My Chart account. Instructions are located on the last page of this paperwork. If you have not heard from us regarding the results in 2 weeks, please contact this office.     

## 2017-09-09 LAB — COMPREHENSIVE METABOLIC PANEL
ALT: 20 IU/L (ref 0–32)
AST: 20 IU/L (ref 0–40)
Albumin/Globulin Ratio: 1.3 (ref 1.2–2.2)
Albumin: 4 g/dL (ref 3.5–5.5)
Alkaline Phosphatase: 56 IU/L (ref 39–117)
BUN/Creatinine Ratio: 11 (ref 9–23)
BUN: 8 mg/dL (ref 6–24)
Bilirubin Total: <0.2 mg/dL (ref 0.0–1.2)
CO2: 22 mmol/L (ref 20–29)
Calcium: 9.1 mg/dL (ref 8.7–10.2)
Chloride: 105 mmol/L (ref 96–106)
Creatinine, Ser: 0.75 mg/dL (ref 0.57–1.00)
GFR calc Af Amer: 109 mL/min/{1.73_m2} (ref >59)
GFR calc non Af Amer: 95 mL/min/{1.73_m2} (ref >59)
Globulin, Total: 3 g/dL (ref 1.5–4.5)
Glucose: 79 mg/dL (ref 65–99)
Potassium: 4.2 mmol/L (ref 3.5–5.2)
Sodium: 140 mmol/L (ref 134–144)
Total Protein: 7 g/dL (ref 6.0–8.5)

## 2017-09-09 LAB — TSH: TSH: 1.17 u[IU]/mL (ref 0.450–4.500)

## 2017-09-12 ENCOUNTER — Encounter: Payer: Self-pay | Admitting: Family Medicine

## 2017-09-18 ENCOUNTER — Encounter: Payer: Self-pay | Admitting: Emergency Medicine

## 2017-09-18 ENCOUNTER — Other Ambulatory Visit: Payer: Self-pay | Admitting: Family Medicine

## 2017-09-18 ENCOUNTER — Ambulatory Visit: Payer: 59 | Admitting: Emergency Medicine

## 2017-09-18 ENCOUNTER — Other Ambulatory Visit: Payer: Self-pay

## 2017-09-18 ENCOUNTER — Other Ambulatory Visit: Payer: Self-pay | Admitting: Emergency Medicine

## 2017-09-18 VITALS — BP 147/88 | HR 68 | Temp 99.0°F | Resp 16 | Ht 64.0 in | Wt 206.6 lb

## 2017-09-18 DIAGNOSIS — M545 Low back pain, unspecified: Secondary | ICD-10-CM

## 2017-09-18 DIAGNOSIS — S39012A Strain of muscle, fascia and tendon of lower back, initial encounter: Secondary | ICD-10-CM

## 2017-09-18 MED ORDER — DICLOFENAC SODIUM 75 MG PO TBEC: 75.0000 mg | DELAYED_RELEASE_TABLET | Freq: Two times a day (BID) | ORAL | 0 refills | Status: DC

## 2017-09-18 NOTE — Patient Instructions (Addendum)
     IF you received an x-ray today, you will receive an invoice from [facility]. Please contact [facility] at [phone] with questions or concerns regarding your invoice.   IF you received labwork today, you will receive an invoice from [facility]. Please contact [facility] at [phone] with questions or concerns regarding your invoice.   Our billing staff will not be able to assist you with questions regarding bills from these companies.  You will be contacted with the lab results as soon as they are available. The fastest way to get your results is to activate your My Chart account. Instructions are located on the last page of this paperwork. If you have not heard from us regarding the results in 2 weeks, please contact this office.      Back Pain, Adult Back pain is very common. The pain often gets better over time. The cause of back pain is usually not dangerous. Most people can learn to manage their back pain on their own. Follow these instructions at home: Watch your back pain for any changes. The following actions may help to lessen any pain you are feeling:  Stay active. Start with short walks on flat ground if you can. Try to walk farther each day.  Exercise regularly as told by your doctor. Exercise helps your back heal faster. It also helps avoid future injury by keeping your muscles strong and flexible.  Do not sit, drive, or stand in one place for more than 30 minutes.  Do not stay in bed. Resting more than 1-2 days can slow down your recovery.  Be careful when you bend or lift an object. Use good form when lifting: ? Bend at your knees. ? Keep the object close to your body. ? Do not twist.  Sleep on a firm mattress. Lie on your side, and bend your knees. If you lie on your back, put a pillow under your knees.  Take medicines only as told by your doctor.  Put ice on the injured area. ? Put ice in a plastic bag. ? Place a towel between your  skin and the bag. ? Leave the ice on for 20 minutes, 2-3 times a day for the first 2-3 days. After that, you can switch between ice and heat packs.  Avoid feeling anxious or stressed. Find good ways to deal with stress, such as exercise.  Maintain a healthy weight. Extra weight puts stress on your back.  Contact a doctor if:  You have pain that does not go away with rest or medicine.  You have worsening pain that goes down into your legs or buttocks.  You have pain that does not get better in one week.  You have pain at night.  You lose weight.  You have a fever or chills. Get help right away if:  You cannot control when you poop (bowel movement) or pee (urinate).  Your arms or legs feel weak.  Your arms or legs lose feeling (numbness).  You feel sick to your stomach (nauseous) or throw up (vomit).  You have belly (abdominal) pain.  You feel like you may pass out (faint). This information is not intended to replace advice given to you by your health care provider. Make sure you discuss any questions you have with your health care provider. Document Released: 12/06/2007 Document Revised: 11/25/2015 Document Reviewed: 10/21/2013 Elsevier Interactive Patient Education  2018 [employer]vier Inc.  

## 2017-09-18 NOTE — Progress Notes (Signed)
[name] [name] 47 y.o.   Chief Complaint  Patient presents with  . Abdominal Pain    RIGHT side and back and hip with weakness had WORSEN since office visit 09/08/2017    HISTORY OF PRESENT ILLNESS: This is a 47 y.o. female with a long history of chronic intermittent pains to right side of her abdomen for many years.  Has had multiple workups.  Has seen multiple physicians.  Pelvic ultrasound last November looked within normal limits.  Recent labs unremarkable.  Works in [employer].  Physical work.  Today complaining of pain to the right lumbar area for the past 2-3 weeks.  Denies urinary symptoms.  Eating and drinking well.  Denies nausea or vomiting.  No other significant symptoms.  Denies hip pain, states pain is to the right lumbar area.  Denies focal weakness.  Able to ambulate well.  Denies neurological deficits or symptoms.    HPI   Prior to Admission medications   Medication Sig Start Date End Date Taking? Authorizing Provider  amLODipine (NORVASC) 5 MG tablet Take 1 tablet (5 mg total) by mouth daily. 09/08/17  Yes Rutherford Guys, MD  ferrous sulfate 325 (65 FE) MG tablet Take 1 tablet (325 mg total) by mouth daily with breakfast. Patient not taking: Reported on 09/08/2017 08/22/16   Scot Jun, FNP  losartan (COZAAR) 100 MG tablet Take 1 tablet (100 mg total) by mouth daily. Patient not taking: Reported on 09/18/2017 03/08/17   Forrest Moron, MD  omeprazole (PRILOSEC) 40 MG capsule Take 1 capsule (40 mg total) by mouth daily. Patient not taking: Reported on 09/08/2017 09/13/16   Scot Jun, FNP    Allergies  Allergen Reactions  . Hctz [Hydrochlorothiazide]     Heart palpitations    Patient Active Problem List   Diagnosis Date Noted  . Lung nodule 08/26/2016  . PVC (premature ventricular contraction) 05/26/2014  . Diverticulosis 04/14/2014  . GERD (gastroesophageal reflux disease) 11/19/2012  . Chest pain 05/21/2012  . Obesity, morbid (more than 100 lbs  over ideal weight or BMI > 40) ([place]) 05/30/2011  . Essential hypertension 04/29/2009    Past Medical History:  Diagnosis Date  . Anemia   . Fatty liver   . H. pylori infection   . HTN (hypertension)   . Hx of cardiovascular stress test    a. ETT-Myoview 05/28/12: Low risk study with a small, mild fixed basal inferior perfusion defect most likely representing soft tissue attenuation with inferolateral HK (wall motion abnormality does not correlate with inferior perfusion defect), EF 49%.  Marland Kitchen Hx of echocardiogram    a. Echocardiogram 06/11/12: EF 50-55%, normal wall motion, grade 1 diastolic dysfunction, trivial AI, mild MR, mild LAE, mild RAE.  . RUQ pain     Past Surgical History:  Procedure Laterality Date  . NO PAST SURGERIES      Social History   Socioeconomic History  . Marital status: Married    Spouse name: Not on file  . Number of children: 4  . Years of education: Not on file  . Highest education level: Not on file  Social Needs  . Financial resource strain: Not on file  . Food insecurity - worry: Not on file  . Food insecurity - inability: Not on file  . Transportation needs - medical: Not on file  . Transportation needs - non-medical: Not on file  Occupational History  . 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 Smoking status: Never Smoker  . Smokeless tobacco: Never Used  Substance and Sexual Activity  . Alcohol use: No  . Drug use: No  . Sexual activity: Yes    Birth control/protection: None  Other Topics Concern  . Not on file  Social History Narrative  . Not on file    Family History  Problem Relation Age of Onset  . Coronary artery disease Mother   . Other Brother        fluid around heart  . Other Other        hepatic issues-? maternal side  . Stomach cancer Neg Hx   . Colon cancer Neg Hx      Review of Systems  Constitutional: Negative.  Negative for chills, fever and weight loss.    HENT: Negative.  Negative for sore throat.   Eyes: Negative.   Respiratory: Negative.  Negative for cough and shortness of breath.   Cardiovascular: Negative.  Negative for chest pain and palpitations.  Gastrointestinal: Negative.  Negative for abdominal pain, diarrhea, nausea and vomiting.  Genitourinary: Negative.  Negative for dysuria and hematuria.  Musculoskeletal: Positive for back pain. Negative for joint pain and neck pain.  Skin: Negative.  Negative for rash.  Neurological: Negative.  Negative for dizziness, sensory change, focal weakness and headaches.  Endo/Heme/Allergies: Negative.   All other systems reviewed and are negative.   Vitals:   09/18/17 1151  BP: (!) 147/88  Pulse: 68  Resp: 16  Temp: 99 F (37.2 C)  SpO2: 100%    Physical Exam  Constitutional: She is oriented to person, place, and time. She appears well-developed and well-nourished.  HENT:  Head: Normocephalic and atraumatic.  Nose: Nose normal.  Mouth/Throat: Oropharynx is clear and moist.  Eyes: Conjunctivae and EOM are normal. Pupils are equal, round, and reactive to light.  Neck: Normal range of motion. Neck supple. No JVD present. No thyromegaly present.  Cardiovascular: Normal rate, regular rhythm and normal heart sounds.  Pulmonary/Chest: Effort normal and breath sounds normal.  Abdominal: Soft. Bowel sounds are normal. She exhibits no distension and no mass. There is no tenderness. There is no rebound and no guarding.  Musculoskeletal:       Lumbar back: She exhibits decreased range of motion and tenderness. She exhibits no bony tenderness, no spasm and normal pulse.       Back:  Lymphadenopathy:    She has no cervical adenopathy.  Neurological: She is alert and oriented to person, place, and time.  Skin: Skin is warm and dry.  Vitals reviewed.    ASSESSMENT & PLAN: [name] was seen today for abdominal pain.  Diagnoses and all orders for this visit:  Lumbar strain, initial  encounter -     Discontinue: diclofenac (VOLTAREN) 75 MG EC tablet; Take 1 tablet (75 mg total) by mouth 2 (two) times daily for 5 days.  Lumbar pain -     Discontinue: diclofenac (VOLTAREN) 75 MG EC tablet; Take 1 tablet (75 mg total) by mouth 2 (two) times daily for 5 days.    Patient Instructions       IF you received an x-ray today, you will receive an invoice from [facility]. Please contact [facility] at [phone] with questions or concerns regarding your invoice.   IF you received labwork today, you will receive an invoice from [facility]. Please contact [facility] at [phone] with questions or concerns regarding your invoice.   Our billing staff will not be able  to assist you with questions regarding bills from these companies.  You will be contacted with the lab results as soon as they are available. The fastest way to get your results is to activate your My Chart account. Instructions are located on the last page of this paperwork. If you have not heard from [place] regarding the results in 2 weeks, please contact this office.     Back Pain, Adult Back pain is very common. The pain often gets better over time. The cause of back pain is usually not dangerous. Most people can learn to manage their back pain on their own. Follow these instructions at home: Watch your back pain for any changes. The following actions may help to lessen any pain you are feeling:  Stay active. Start with short walks on flat ground if you can. Try to walk farther each day.  Exercise regularly as told by your doctor. Exercise helps your back heal faster. It also helps avoid future injury by keeping your muscles strong and flexible.  Do not sit, drive, or stand in one place for more than 30 minutes.  Do not stay in bed. Resting more than 1-2 days can slow down your recovery.  Be careful when you bend or lift an object. Use good form when lifting: ? Bend at your knees. ? Keep  the object close to your body. ? Do not twist.  Sleep on a firm mattress. Lie on your side, and bend your knees. If you lie on your back, put a pillow under your knees.  Take medicines only as told by your doctor.  Put ice on the injured area. ? Put ice in a plastic bag. ? Place a towel between your skin and the bag. ? Leave the ice on for 20 minutes, 2-3 times a day for the first 2-3 days. After that, you can switch between ice and heat packs.  Avoid feeling anxious or stressed. Find good ways to deal with stress, such as exercise.  Maintain a healthy weight. Extra weight puts stress on your back.  Contact a doctor if:  You have pain that does not go away with rest or medicine.  You have worsening pain that goes down into your legs or buttocks.  You have pain that does not get better in one week.  You have pain at night.  You lose weight.  You have a fever or chills. Get help right away if:  You cannot control when you poop (bowel movement) or pee (urinate).  Your arms or legs feel weak.  Your arms or legs lose feeling (numbness).  You feel sick to your stomach (nauseous) or throw up (vomit).  You have belly (abdominal) pain.  You feel like you may pass out (faint). This information is not intended to replace advice given to you by your health care provider. Make sure you discuss any questions you have with your health care provider. Document Released: 12/06/2007 Document Revised: 11/25/2015 Document Reviewed: 10/21/2013 Elsevier Interactive Patient Education  2018 Elsevier Inc.      Agustina Caroli, MD [facility]

## 2017-09-26 ENCOUNTER — Encounter: Payer: Self-pay | Admitting: Internal Medicine

## 2017-10-03 ENCOUNTER — Ambulatory Visit: Payer: 59 | Admitting: Family Medicine

## 2017-11-06 DIAGNOSIS — Z01419 Encounter for gynecological examination (general) (routine) without abnormal findings: Secondary | ICD-10-CM | POA: Diagnosis not present

## 2017-11-06 DIAGNOSIS — D649 Anemia, unspecified: Secondary | ICD-10-CM | POA: Diagnosis not present

## 2017-11-20 DIAGNOSIS — N879 Dysplasia of cervix uteri, unspecified: Secondary | ICD-10-CM | POA: Diagnosis not present

## 2017-11-20 DIAGNOSIS — N870 Mild cervical dysplasia: Secondary | ICD-10-CM | POA: Diagnosis not present

## 2017-12-07 ENCOUNTER — Telehealth: Payer: Self-pay | Admitting: Family Medicine

## 2017-12-07 NOTE — Telephone Encounter (Signed)
Patient needs appointment for new medications. Please call patient to schedule.

## 2017-12-07 NOTE — Telephone Encounter (Addendum)
Copied from [place] [id number]. Topic: General - Other >> Dec 07, 2017  8:52 AM Lennox Solders wrote: Reason for CRM: pt is going to [place] 12-16-17 and would like to know if md will call malaria pill into [facility]. Pt will be in [place] for 2 month

## 2017-12-13 ENCOUNTER — Ambulatory Visit: Payer: 59 | Admitting: Family Medicine

## 2017-12-13 ENCOUNTER — Other Ambulatory Visit: Payer: Self-pay

## 2017-12-13 ENCOUNTER — Encounter: Payer: Self-pay | Admitting: Family Medicine

## 2017-12-13 VITALS — BP 141/91 | HR 87 | Temp 98.6°F | Resp 16 | Ht 64.0 in | Wt 207.2 lb

## 2017-12-13 DIAGNOSIS — Z7184 Encounter for health counseling related to travel: Secondary | ICD-10-CM

## 2017-12-13 DIAGNOSIS — Z5181 Encounter for therapeutic drug level monitoring: Secondary | ICD-10-CM

## 2017-12-13 DIAGNOSIS — Z7189 Other specified counseling: Secondary | ICD-10-CM | POA: Diagnosis not present

## 2017-12-13 DIAGNOSIS — I10 Essential (primary) hypertension: Secondary | ICD-10-CM | POA: Diagnosis not present

## 2017-12-13 MED ORDER — ATOVAQUONE-PROGUANIL HCL 250-100 MG PO TABS: ORAL_TABLET | ORAL | 0 refills | Status: DC

## 2017-12-13 MED ORDER — AMLODIPINE BESYLATE 5 MG PO TABS: 5.0000 mg | ORAL_TABLET | Freq: Every day | ORAL | 1 refills | Status: DC

## 2017-12-13 NOTE — Patient Instructions (Addendum)
IF you received an x-ray today, you will receive an invoice from [facility]. Please contact [facility] at [phone] with questions or concerns regarding your invoice.   IF you received labwork today, you will receive an invoice from [facility]. Please contact [facility] at [phone] with questions or concerns regarding your invoice.   Our billing staff will not be able to assist you with questions regarding bills from these companies.  You will be contacted with the lab results as soon as they are available. The fastest way to get your results is to activate your My Chart account. Instructions are located on the last page of this paperwork. If you have not heard from [place] regarding the results in 2 weeks, please contact this office.     Malaria Malariais a disease that is caused by a type of single-celled germ that can live inside a person's body (parasite). The malaria parasite is from the Plasmodium family of parasites. This parasite can get into a person's blood when he or she is bitten by a certain type of mosquito (Anopheles mosquito). These mosquitoes are most common in tropical areas of the world. Usually, they are not found in the [place]. If you are bitten by an infected mosquito, the parasites can travel through your blood to your liver. The parasites mature in your liver, then they are released into your blood. They can then invade red blood cells. The parasites multiply inside red blood cells and cause the cells to break open (rupture). This infects more red blood cells. Losing red blood cells may cause you to have a low red blood cell count (anemia). What are the causes? This condition is caused by a Plasmodium parasite. Four different types of the parasite can cause disease in humans. Most cases of malaria come from a mosquito bite, but the disease can also be passed from person to person through blood. What increases the risk? This condition is more  likely to develop in:  People who live or travel in an area of the world where malaria is common.  People who receive donated blood (transfusion) or an organ transplant that is contaminated with infected blood cells.  People who share needles with a person who is infected with malaria.  Children.  Pregnant women.  People who have never been exposed to malaria parasites before. These people have not built up protection (immunity) against the parasites.  What are the signs or symptoms? Symptoms can vary depending on which parasite caused the infection. Symptoms usually come and go or occur in cycles. The first symptoms of this condition usually start 10 days to 4 weeks after the mosquito bite. Symptoms for all types of malaria usually happen in this order:  Chills, along with headache, muscle aches, fatigue, and nausea.  Fever, along with hot and dry skin.  Drenching sweats, along with weakness and exhaustion.  Other symptoms include:  Diarrhea or bloody stools (feces).  Yellowing of the skin and the whites of the eyes (jaundice).  Enlarged spleen or liver.  Severe symptoms include:  Trouble breathing.  Seizures.  Loss of consciousness (coma).  Bleeding.  How is this diagnosed? This condition may be diagnosed based on:  Your symptoms and medical history. Your health care provider may suspect malaria if you have been living or traveling in an area where the disease is common.  A physical exam.  Blood tests to confirm the diagnosis. These may include: ? Examining a blood sample under a microscope.  This is the most common way to diagnose malaria. Each type of parasite looks different under the microscope. Identifying which parasite is causing your infection will help your health care provider to determine which medicines will work best. ? Complete blood count (CBC) to check for anemia.  How is this treated? This condition may be treated with many different medicines  and combinations of medicines, often requiring treatment in the hospital. The best treatment for you will depend on:  Which type of parasite is causing your infection.  Whether various medicines are effective against it.  How you got the infection.  How severe your infection is.  Your age and your general health.  Follow these instructions at home:  Take over-the-counter and prescription medicines only as told by your health care provider.  Rest at home until your symptoms are under control.  Drink enough fluid to keep your urine clear or pale yellow.  Keep all follow-up visits as told by your health care provider. This is important. How is this prevented?  If you will be traveling to an area where malaria is common: ? Visit the website of the Centers for Disease Control and Prevention (CDC) to check your risk: DiscoSites.com.ee ? Make an appointment with your health care provider at least 2 weeks before you leave. You may be given a medicine to help prevent malaria.  You can also prevent malaria by: ? Using insect repellent. ? Staying indoors after daytime starts to turn to night or dusk. ? Wearing protective clothing that covers your arms and legs. ? Hanging a mosquito net over your bed. Contact a health care provider if:  You have an attack of malaria symptoms.  You develop jaundice. Get help right away if:  You have a seizure.  You have bleeding.  You have trouble breathing. This information is not intended to replace advice given to you by your health care provider. Make sure you discuss any questions you have with your health care provider. Document Released: 02/01/2004 Document Revised: 01/09/2016 Document Reviewed: 11/26/2015 Elsevier Interactive Patient Education  2018 [employer].

## 2017-12-13 NOTE — Progress Notes (Signed)
Chief Complaint  Patient presents with  . Medication Refill    needs malaria pill an amlodipine and losartan- needs 2 month supply.  Pt is traveling to [place]    HPI   Hypertension: Patient here for follow-up of elevated blood pressure. She is exercising and is adherent to low salt diet.  Blood pressure is well controlled at home. Cardiac symptoms none. Patient denies chest pain, chest pressure/discomfort, dyspnea, exertional chest pressure/discomfort, irregular heart beat, lower extremity edema, near-syncope and orthopnea.  Cardiovascular risk factors: dyslipidemia, hypertension and obesity (BMI >= 30 kg/m2). Use of agents associated with hypertension: none. History of target organ damage: none.  BP Readings from Last 3 Encounters:  12/13/17 (!) 141/91  09/18/17 (!) 147/88  09/08/17 (!) 164/101   Lab Results  Component Value Date   CREATININE 0.77 12/13/2017    She had amlodipine added to her losartan as she was still having uncontrolled bp in March 2019 Today she is taking her meds - She is going home to [place] for 2 months  Travel Advice Encounter She is also requestion Malaria chemoprophylaxis In the past she took Malarone but her last time going to [place] her home country was 19 years ago. She will be staying for 2 months She currently denies RUQ pain, fevers, chills or dysuria.    Past Medical History:  Diagnosis Date  . Anemia   . Fatty liver   . H. pylori infection   . HTN (hypertension)   . Hx of cardiovascular stress test    a. ETT-Myoview 05/28/12: Low risk study with a small, mild fixed basal inferior perfusion defect most likely representing soft tissue attenuation with inferolateral HK (wall motion abnormality does not correlate with inferior perfusion defect), EF 49%.  Marland Kitchen Hx of echocardiogram    a. Echocardiogram 06/11/12: EF 50-55%, normal wall motion, grade 1 diastolic dysfunction, trivial AI, mild MR, mild LAE, mild RAE.  . RUQ pain     Current Outpatient  Medications  Medication Sig Dispense Refill  . diclofenac (VOLTAREN) 75 MG EC tablet TAKE 1 TABLET(75 MG) BY MOUTH TWICE DAILY FOR 5 DAYS 180 tablet 0  . ferrous sulfate 325 (65 FE) MG tablet Take 1 tablet (325 mg total) by mouth daily with breakfast. 90 tablet 3  . losartan (COZAAR) 100 MG tablet Take 1 tablet (100 mg total) by mouth daily. 90 tablet 3  . omeprazole (PRILOSEC) 40 MG capsule TAKE 1 CAPSULE(40 MG) BY MOUTH DAILY 90 capsule 0  . amLODipine (NORVASC) 5 MG tablet Take 1 tablet (5 mg total) by mouth daily. 90 tablet 1  . atovaquone-proguanil (MALARONE) 250-100 MG TABS tablet Start 2 days before departure.  Take one tablet daily until a week after returning to the [place]. Take by mouth. 90 tablet 0   No current facility-administered medications for this visit.     Allergies:  Allergies  Allergen Reactions  . Hctz [Hydrochlorothiazide]     Heart palpitations    Past Surgical History:  Procedure Laterality Date  . NO PAST SURGERIES      Social History   Socioeconomic History  . Marital status: Married    Spouse name: Not on file  . Number of children: 4  . Years of education: Not on file  . Highest education level: Not on file  Occupational History  . Occupation: Housekeeper    Comment: at hotel    Employer: [employer]  Social Needs  . Financial resource strain: Not on file  . Food insecurity:  Worry: Not on file    Inability: Not on file  . Transportation needs:    Medical: Not on file    Non-medical: Not on file  Tobacco Use  . Smoking status: Never Smoker  . Smokeless tobacco: Never Used  Substance and Sexual Activity  . Alcohol use: No  . Drug use: No  . Sexual activity: Yes    Birth control/protection: None  Lifestyle  . Physical activity:    Days per week: Not on file    Minutes per session: Not on file  . Stress: Not on file  Relationships  . Social connections:    Talks on phone: Not on file    Gets together: Not on file    Attends  religious service: Not on file    Active member of club or organization: Not on file    Attends meetings of clubs or organizations: Not on file    Relationship status: Not on file  Other Topics Concern  . Not on file  Social History Narrative  . Not on file    Family History  Problem Relation Age of Onset  . Coronary artery disease Mother   . Other Brother        fluid around heart  . Other Other        hepatic issues-? maternal side  . Stomach cancer Neg Hx   . Colon cancer Neg Hx      ROS Review of Systems See HPI Constitution: No fevers or chills No malaise No diaphoresis Skin: No rash or itching Eyes: no blurry vision, no double vision GU: no dysuria or hematuria Neuro: no dizziness or headaches  all others reviewed and negative   Objective: Vitals:   12/13/17 1152  BP: (!) 141/91  Pulse: 87  Resp: 16  Temp: 98.6 F (37 C)  TempSrc: Oral  SpO2: 100%  Weight: 207 lb 3.2 oz (94 kg)  Height: 5\' 4"  (1.626 m)    Physical Exam  Physical Exam  Constitutional: She is oriented to person, place, and time. She appears well-developed and well-nourished.  HENT:  Head: Normocephalic and atraumatic.  Eyes: Conjunctivae and EOM are normal.  Cardiovascular: Normal rate, regular rhythm and normal heart sounds.   Pulmonary/Chest: Effort normal and breath sounds normal. No respiratory distress. She has no wheezes.  Abdominal: Normal appearance and bowel sounds are normal. There is no tenderness. There is no CVA tenderness.  Neurological: She is alert and oriented to person, place, and time.  Extremities: no edema, normal cap refills  Assessment and Plan Anquanette was seen today for medication refill.  Diagnoses and all orders for this visit:  Travel advice encounter- discussed chemoprophylaxis with external sprays like DEET and taking Malarone Discussed common side effects and risks  Discussed how to take  -     atovaquone-proguanil (MALARONE) 250-100 MG TABS  tablet; Start 2 days before departure.  Take one tablet daily until a week after returning to the [place]. Take by mouth.  Essential hypertension, benign- bp improved with addition of second agent in March -  continue current medications - DASH diet reviewed - discussed monitoring schedule for renal function - advised ways to prevent cardiovascular disease    -     amLODipine (NORVASC) 5 MG tablet; Take 1 tablet (5 mg total) by mouth daily. -     Comprehensive metabolic panel  Encounter for medication monitoring -     Comprehensive metabolic panel     Jannell Franta A Nolon Rod

## 2017-12-14 LAB — COMPREHENSIVE METABOLIC PANEL
A/G RATIO: 1.3 (ref 1.2–2.2)
ALT: 17 IU/L (ref 0–32)
AST: 16 IU/L (ref 0–40)
Albumin: 4 g/dL (ref 3.5–5.5)
Alkaline Phosphatase: 67 IU/L (ref 39–117)
BUN/Creatinine Ratio: 12 (ref 9–23)
BUN: 9 mg/dL (ref 6–24)
CALCIUM: 9.2 mg/dL (ref 8.7–10.2)
CO2: 24 mmol/L (ref 20–29)
Chloride: 104 mmol/L (ref 96–106)
Creatinine, Ser: 0.77 mg/dL (ref 0.57–1.00)
GFR, EST AFRICAN AMERICAN: 106 mL/min/{1.73_m2} (ref >59)
GFR, EST NON AFRICAN AMERICAN: 92 mL/min/{1.73_m2} (ref >59)
GLOBULIN, TOTAL: 3 g/dL (ref 1.5–4.5)
Glucose: 87 mg/dL (ref 65–99)
POTASSIUM: 4.5 mmol/L (ref 3.5–5.2)
SODIUM: 140 mmol/L (ref 134–144)
Total Protein: 7 g/dL (ref 6.0–8.5)

## 2017-12-15 MED ORDER — OMEPRAZOLE 40 MG PO CPDR: DELAYED_RELEASE_CAPSULE | ORAL | 3 refills | Status: DC

## 2017-12-15 MED ORDER — FERROUS SULFATE 325 (65 FE) MG PO TABS
325.0000 mg | ORAL_TABLET | Freq: Every day | ORAL | 3 refills | Status: DC
Start: 2017-12-15 — End: 2018-06-10

## 2017-12-15 MED ORDER — LOSARTAN POTASSIUM 100 MG PO TABS: 100.0000 mg | ORAL_TABLET | Freq: Every day | ORAL | 3 refills | Status: DC

## 2018-03-18 ENCOUNTER — Other Ambulatory Visit: Payer: Self-pay | Admitting: Family Medicine

## 2018-03-18 DIAGNOSIS — I10 Essential (primary) hypertension: Secondary | ICD-10-CM

## 2018-04-16 ENCOUNTER — Telehealth: Payer: Self-pay

## 2018-04-16 NOTE — Telephone Encounter (Signed)
Left message reminding patient it was time for annual exam.

## 2018-06-10 ENCOUNTER — Other Ambulatory Visit: Payer: Self-pay | Admitting: Emergency Medicine

## 2018-06-10 ENCOUNTER — Ambulatory Visit: Payer: 59 | Admitting: Emergency Medicine

## 2018-06-10 ENCOUNTER — Other Ambulatory Visit: Payer: Self-pay

## 2018-06-10 ENCOUNTER — Encounter: Payer: Self-pay | Admitting: Emergency Medicine

## 2018-06-10 ENCOUNTER — Telehealth: Payer: Self-pay | Admitting: *Deleted

## 2018-06-10 VITALS — BP 148/88 | HR 87 | Temp 99.1°F | Ht 64.0 in | Wt 217.8 lb

## 2018-06-10 DIAGNOSIS — I10 Essential (primary) hypertension: Secondary | ICD-10-CM

## 2018-06-10 DIAGNOSIS — M25521 Pain in right elbow: Secondary | ICD-10-CM

## 2018-06-10 DIAGNOSIS — S5001XA Contusion of right elbow, initial encounter: Secondary | ICD-10-CM | POA: Diagnosis not present

## 2018-06-10 DIAGNOSIS — Z23 Encounter for immunization: Secondary | ICD-10-CM

## 2018-06-10 MED ORDER — DICLOFENAC SODIUM 75 MG PO TBEC: 75.0000 mg | DELAYED_RELEASE_TABLET | Freq: Two times a day (BID) | ORAL | 0 refills | Status: DC

## 2018-06-10 MED ORDER — LOSARTAN POTASSIUM 100 MG PO TABS: 100.0000 mg | ORAL_TABLET | Freq: Every day | ORAL | 3 refills | Status: DC

## 2018-06-10 MED ORDER — DICLOFENAC SODIUM 75 MG PO TBEC: 75.0000 mg | DELAYED_RELEASE_TABLET | Freq: Two times a day (BID) | ORAL | 0 refills | Status: AC

## 2018-06-10 NOTE — Progress Notes (Signed)
BP Readings from Last 3 Encounters:  06/10/18 (!) 148/88  12/13/17 (!) 141/91  09/18/17 (!) 147/88   [name] [name] 47 y.o.   Chief Complaint  Patient presents with  . right elbow pain    hit her elbow 1 month ago. and pain goes up the arm   . Medication Refill    losartin     HISTORY OF PRESENT ILLNESS: This is a 47 y.o. female complaining of some pain to her right elbow since 1 month ago when she bumped it against a corner at work.  Works in [employer].  Pain is sharp and steady with radiation up and down the arm.  Better with rest worse with movement.  No other significant symptoms. Also has a history of hypertension and needs medication refill.  HPI   Prior to Admission medications   Medication Sig Start Date End Date Taking? Authorizing Provider  losartan (COZAAR) 100 MG tablet Take 1 tablet (100 mg total) by mouth daily. 06/10/18  Yes Horald Pollen, MD    Allergies  Allergen Reactions  . Hctz [Hydrochlorothiazide]     Heart palpitations    Patient Active Problem List   Diagnosis Date Noted  . Contusion of right elbow 06/10/2018  . Right elbow pain 06/10/2018  . Lumbar strain, initial encounter 09/18/2017  . Lumbar pain 09/18/2017  . Lung nodule 08/26/2016  . PVC (premature ventricular contraction) 05/26/2014  . Diverticulosis 04/14/2014  . GERD (gastroesophageal reflux disease) 11/19/2012  . Chest pain 05/21/2012  . Obesity, morbid (more than 100 lbs over ideal weight or BMI > 40) ([place]) 05/30/2011  . Essential hypertension 04/29/2009    Past Medical History:  Diagnosis Date  . Anemia   . Fatty liver   . H. pylori infection   . HTN (hypertension)   . Hx of cardiovascular stress test    a. ETT-Myoview 05/28/12: Low risk study with a small, mild fixed basal inferior perfusion defect most likely representing soft tissue attenuation with inferolateral HK (wall motion abnormality does not correlate with inferior perfusion defect), EF 49%.  Marland Kitchen Hx of  echocardiogram    a. Echocardiogram 06/11/12: EF 50-55%, normal wall motion, grade 1 diastolic dysfunction, trivial AI, mild MR, mild LAE, mild RAE.  . RUQ pain     Past Surgical History:  Procedure Laterality Date  . NO PAST SURGERIES      Social History   Socioeconomic History  . Marital status: Married    Spouse name: Not on file  . Number of children: 4  . Years of education: Not on file  . Highest education level: Not on file  Occupational History  . Occupation: Housekeeper    Comment: at hotel    Employer: [employer]  Social Needs  . Financial resource strain: Not on file  . Food insecurity:    Worry: Not on file    Inability: Not on file  . Transportation needs:    Medical: Not on file    Non-medical: Not on file  Tobacco Use  . Smoking status: Never Smoker  . Smokeless tobacco: Never Used  Substance and Sexual Activity  . Alcohol use: No  . Drug use: No  . Sexual activity: Yes    Birth control/protection: None  Lifestyle  . Physical activity:    Days per week: Not on file    Minutes per session: Not on file  . Stress: Not on file  Relationships  . Social connections:    Talks on phone: Not  on file    Gets together: Not on file    Attends religious service: Not on file    Active member of club or organization: Not on file    Attends meetings of clubs or organizations: Not on file    Relationship status: Not on file  . Intimate partner violence:    Fear of current or ex partner: Not on file    Emotionally abused: Not on file    Physically abused: Not on file    Forced sexual activity: Not on file  Other Topics Concern  . Not on file  Social History Narrative  . Not on file    Family History  Problem Relation Age of Onset  . Coronary artery disease Mother   . Other Brother        fluid around heart  . Other Other        hepatic issues-? maternal side  . Stomach cancer Neg Hx   . Colon cancer Neg Hx      Review of Systems    Constitutional: Negative.  Negative for chills and fever.  HENT: Negative.   Eyes: Negative.   Respiratory: Negative.  Negative for cough and shortness of breath.   Cardiovascular: Negative.  Negative for chest pain and palpitations.  Gastrointestinal: Negative.  Negative for abdominal pain, nausea and vomiting.  Genitourinary: Negative.   Musculoskeletal: Positive for joint pain (Right elbow).  Skin: Negative.  Negative for rash.  Neurological: Negative.  Negative for dizziness and headaches.  Endo/Heme/Allergies: Negative.   All other systems reviewed and are negative.  Vitals:   06/10/18 1530 06/10/18 1533  BP: (!) 163/98 (!) 148/88  Pulse: 87   Temp: 99.1 F (37.3 C)   SpO2: 100%      Physical Exam  Constitutional: She is oriented to person, place, and time. She appears well-developed and well-nourished.  HENT:  Head: Normocephalic and atraumatic.  Eyes: Pupils are equal, round, and reactive to light. Conjunctivae are normal.  Neck: Normal range of motion. Neck supple.  Cardiovascular: Normal rate and regular rhythm.  Pulmonary/Chest: Effort normal and breath sounds normal.  Abdominal: There is no tenderness.  Musculoskeletal:  Right elbow: Full range of motion.  No erythema or bruising.  No swelling.  Some lateral tenderness.  Neurological: She is alert and oriented to person, place, and time. No sensory deficit. She exhibits normal muscle tone.  Skin: Skin is warm and dry. Capillary refill takes less than 2 seconds.  Psychiatric: She has a normal mood and affect. Her behavior is normal.  Vitals reviewed.    ASSESSMENT & PLAN:  Patric was seen today for right elbow pain and medication refill.  Diagnoses and all orders for this visit:  Right elbow pain -     diclofenac (VOLTAREN) 75 MG EC tablet; Take 1 tablet (75 mg total) by mouth 2 (two) times daily for 5 days. After 5 days take as needed.  Need for influenza vaccination -     Flu Vaccine QUAD 36+ mos  IM  Essential hypertension -     losartan (COZAAR) 100 MG tablet; Take 1 tablet (100 mg total) by mouth daily.  Contusion of right elbow, initial encounter    Patient Instructions       If you have lab work done today you will be contacted with your lab results within the next 2 weeks.  If you have not heard from [place] then please contact us. The fastest way to get your results is  to register for My Chart.   IF you received an x-ray today, you will receive an invoice from [facility]. Please contact [facility] at [phone] with questions or concerns regarding your invoice.   IF you received labwork today, you will receive an invoice from [facility]. Please contact [facility] at [phone] with questions or concerns regarding your invoice.   Our billing staff will not be able to assist you with questions regarding bills from these companies.  You will be contacted with the lab results as soon as they are available. The fastest way to get your results is to activate your My Chart account. Instructions are located on the last page of this paperwork. If you have not heard from [place] regarding the results in 2 weeks, please contact this office.      Contusion A contusion is a deep bruise. Contusions happen when an injury causes bleeding under the skin. Symptoms of bruising include pain, swelling, and discolored skin. The skin may turn blue, purple, or yellow. Follow these instructions at home:  Rest the injured area.  If told, put ice on the injured area. ? Put ice in a plastic bag. ? Place a towel between your skin and the bag. ? Leave the ice on for 20 minutes, 2-3 times per day.  If told, put light pressure (compression) on the injured area using an elastic bandage. Make sure the bandage is not too tight. Remove it and put it back on as told by your doctor.  If possible, raise (elevate) the injured area above the level of your heart while you are sitting or lying  down.  Take over-the-counter and prescription medicines only as told by your doctor. Contact a doctor if:  Your symptoms do not get better after several days of treatment.  Your symptoms get worse.  You have trouble moving the injured area. Get help right away if:  You have very bad pain.  You have a loss of feeling (numbness) in a hand or foot.  Your hand or foot turns pale or cold. This information is not intended to replace advice given to you by your health care provider. Make sure you discuss any questions you have with your health care provider. Document Released: 12/06/2007 Document Revised: 11/25/2015 Document Reviewed: 11/04/2014 Elsevier Interactive Patient Education  2018 Elsevier Inc.     Agustina Caroli, MD [facility]

## 2018-06-10 NOTE — Patient Instructions (Addendum)
     If you have lab work done today you will be contacted with your lab results within the next 2 weeks.  If you have not heard from [place] then please contact us. The fastest way to get your results is to register for My Chart.   IF you received an x-ray today, you will receive an invoice from [facility]. Please contact [facility] at [phone] with questions or concerns regarding your invoice.   IF you received labwork today, you will receive an invoice from [place]. Please contact [facility] at [phone] with questions or concerns regarding your invoice.   Our billing staff will not be able to assist you with questions regarding bills from these companies.  You will be contacted with the lab results as soon as they are available. The fastest way to get your results is to activate your My Chart account. Instructions are located on the last page of this paperwork. If you have not heard from [place] regarding the results in 2 weeks, please contact this office.      Contusion A contusion is a deep bruise. Contusions happen when an injury causes bleeding under the skin. Symptoms of bruising include pain, swelling, and discolored skin. The skin may turn blue, purple, or yellow. Follow these instructions at home:  Rest the injured area.  If told, put ice on the injured area. ? Put ice in a plastic bag. ? Place a towel between your skin and the bag. ? Leave the ice on for 20 minutes, 2-3 times per day.  If told, put light pressure (compression) on the injured area using an elastic bandage. Make sure the bandage is not too tight. Remove it and put it back on as told by your doctor.  If possible, raise (elevate) the injured area above the level of your heart while you are sitting or lying down.  Take over-the-counter and prescription medicines only as told by your doctor. Contact a doctor if:  Your symptoms do not get better after several days of treatment.  Your  symptoms get worse.  You have trouble moving the injured area. Get help right away if:  You have very bad pain.  You have a loss of feeling (numbness) in a hand or foot.  Your hand or foot turns pale or cold. This information is not intended to replace advice given to you by your health care provider. Make sure you discuss any questions you have with your health care provider. Document Released: 12/06/2007 Document Revised: 11/25/2015 Document Reviewed: 11/04/2014 Elsevier Interactive Patient Education  2018 [employer].

## 2018-06-10 NOTE — Telephone Encounter (Signed)
Spoke to [employer] manager) at [facility] [employer] to cancel diclofenac and losartan because patient changed pharmacy. Correct pharmacy patient wanted is [facility] [address], prescription sent there.

## 2018-06-12 ENCOUNTER — Telehealth: Payer: Self-pay

## 2018-06-12 NOTE — Telephone Encounter (Signed)
Patient is requesting a 90 day supply of medication- pharmacy has sent over the request. Change sent for provider review.

## 2018-06-12 NOTE — Telephone Encounter (Signed)
Patient is in 08 recall. Due for AEX and pap smear. Please contact to schedule. Hx of LGSIL/+HR HPV. Colpo showed CIN 1

## 2018-06-12 NOTE — Telephone Encounter (Signed)
Requested medication (s) are due for refill today: no  Requested medication (s) are on the active medication list: yes  Last refill:  06/10/18  Future visit scheduled: no  Notes to clinic:  Verified pt has picked up this medication ([place])  Pt has had multiple request for this medication and using multiple [facility] pharmacies. Please review.     Requested Prescriptions  Pending Prescriptions Disp Refills   diclofenac (VOLTAREN) 75 MG EC tablet [Pharmacy Med Name: DICLOFENAC SODIUM 75MG  DR TABLETS] 60 tablet 0    Sig: TAKE 1 TABLET BY MOUTH TWICE DAILY FOR 5 DAYS, THEN AS NEEDED     Analgesics:  NSAIDS Failed - 06/10/2018  5:46 PM      Failed - HGB in normal range and within 360 days    Hemoglobin  Date Value Ref Range Status  09/08/2017 9.5 (A) 12.2 - 16.2 g/dL Final  03/08/2017 10.4 (L) 11.1 - 15.9 g/dL Final         Passed - Cr in normal range and within 360 days    Creat  Date Value Ref Range Status  12/07/2015 0.82 0.50 - 1.10 mg/dL Final   Creatinine, Ser  Date Value Ref Range Status  12/13/2017 0.77 0.57 - 1.00 mg/dL Final         Passed - Patient is not pregnant      Passed - Valid encounter within last 12 months    Recent Outpatient Visits          2 days ago Right elbow pain   [facility], Ines Bloomer, MD   6 months ago Travel advice encounter   [facility] at [facility], Arlie Solomons, MD   8 months ago Lumbar strain, initial encounter   [facility] at [facility], Ines Bloomer, MD   9 months ago Dysuria   [facility], Lilia Argue, MD   1 year ago Neck pain   Primary Care at [place], [place]

## 2018-06-13 NOTE — Telephone Encounter (Signed)
Left voicemail to call back to schedule AEX.

## 2018-06-17 NOTE — Telephone Encounter (Signed)
Routed to [place], Therapist, sports

## 2018-06-17 NOTE — Telephone Encounter (Signed)
Patient returned call and said she is scheduled to have a pap smear and mammogram with another doctor next Monday, 06/24/18. She was not sure of the doctors name and will call back with that information.

## 2018-06-17 NOTE — Telephone Encounter (Signed)
Called patient. Spoke with husband. Ok per [employer]. He will have patient call back to schedule appt.

## 2018-06-17 NOTE — Telephone Encounter (Signed)
Patient called back stating she was going to see Dr.James Tomblin.

## 2018-06-17 NOTE — Telephone Encounter (Signed)
Recalls removed. Routing to Dr Sabra Heck. Encounter closed.

## 2018-06-24 DIAGNOSIS — R87612 Low grade squamous intraepithelial lesion on cytologic smear of cervix (LGSIL): Secondary | ICD-10-CM | POA: Diagnosis not present

## 2018-06-24 DIAGNOSIS — N870 Mild cervical dysplasia: Secondary | ICD-10-CM | POA: Diagnosis not present

## 2018-08-16 DIAGNOSIS — H00025 Hordeolum internum left lower eyelid: Secondary | ICD-10-CM | POA: Diagnosis not present

## 2018-12-26 ENCOUNTER — Other Ambulatory Visit: Payer: Self-pay

## 2018-12-26 ENCOUNTER — Encounter: Payer: Self-pay | Admitting: Registered Nurse

## 2018-12-26 ENCOUNTER — Ambulatory Visit (INDEPENDENT_AMBULATORY_CARE_PROVIDER_SITE_OTHER): Payer: 59 | Admitting: Registered Nurse

## 2018-12-26 VITALS — BP 140/89 | HR 87 | Temp 98.7°F | Resp 14 | Wt 237.0 lb

## 2018-12-26 DIAGNOSIS — E785 Hyperlipidemia, unspecified: Secondary | ICD-10-CM | POA: Diagnosis not present

## 2018-12-26 DIAGNOSIS — Z0001 Encounter for general adult medical examination with abnormal findings: Secondary | ICD-10-CM

## 2018-12-26 DIAGNOSIS — D508 Other iron deficiency anemias: Secondary | ICD-10-CM

## 2018-12-26 DIAGNOSIS — I10 Essential (primary) hypertension: Secondary | ICD-10-CM

## 2018-12-26 LAB — LIPID PANEL
Chol/HDL Ratio: 2.2 ratio (ref 0.0–4.4)
Cholesterol, Total: 192 mg/dL (ref 100–199)
HDL: 88 mg/dL (ref >39)
LDL Calculated: 94 mg/dL (ref 0–99)
Triglycerides: 50 mg/dL (ref 0–149)
VLDL Cholesterol Cal: 10 mg/dL (ref 5–40)

## 2018-12-26 LAB — COMPREHENSIVE METABOLIC PANEL
ALT: 15 IU/L (ref 0–32)
AST: 21 IU/L (ref 0–40)
Albumin/Globulin Ratio: 1.5 (ref 1.2–2.2)
Albumin: 4.3 g/dL (ref 3.8–4.8)
Alkaline Phosphatase: 66 IU/L (ref 39–117)
BUN/Creatinine Ratio: 12 (ref 9–23)
BUN: 9 mg/dL (ref 6–24)
Bilirubin Total: 0.3 mg/dL (ref 0.0–1.2)
CO2: 19 mmol/L — ABNORMAL LOW (ref 20–29)
Calcium: 9.1 mg/dL (ref 8.7–10.2)
Chloride: 103 mmol/L (ref 96–106)
Creatinine, Ser: 0.75 mg/dL (ref 0.57–1.00)
GFR calc Af Amer: 108 mL/min/{1.73_m2} (ref >59)
GFR calc non Af Amer: 94 mL/min/{1.73_m2} (ref >59)
Globulin, Total: 2.8 g/dL (ref 1.5–4.5)
Glucose: 93 mg/dL (ref 65–99)
Potassium: 4.1 mmol/L (ref 3.5–5.2)
Sodium: 138 mmol/L (ref 134–144)
Total Protein: 7.1 g/dL (ref 6.0–8.5)

## 2018-12-26 LAB — CBC WITH DIFFERENTIAL/PLATELET
Basophils Absolute: 0 10*3/uL (ref 0.0–0.2)
Basos: 1 %
EOS (ABSOLUTE): 0.1 10*3/uL (ref 0.0–0.4)
Eos: 3 %
Hematocrit: 31.7 % — ABNORMAL LOW (ref 34.0–46.6)
Hemoglobin: 8.8 g/dL — ABNORMAL LOW (ref 11.1–15.9)
Immature Grans (Abs): 0 10*3/uL (ref 0.0–0.1)
Immature Granulocytes: 0 %
Lymphocytes Absolute: 1 10*3/uL (ref 0.7–3.1)
Lymphs: 33 %
MCH: 21.1 pg — ABNORMAL LOW (ref 26.6–33.0)
MCHC: 27.8 g/dL — ABNORMAL LOW (ref 31.5–35.7)
MCV: 76 fL — ABNORMAL LOW (ref 79–97)
Monocytes Absolute: 0.4 10*3/uL (ref 0.1–0.9)
Monocytes: 12 %
Neutrophils Absolute: 1.6 10*3/uL (ref 1.4–7.0)
Neutrophils: 51 %
Platelets: 287 10*3/uL (ref 150–450)
RBC: 4.17 x10E6/uL (ref 3.77–5.28)
RDW: 17.1 % — ABNORMAL HIGH (ref 11.7–15.4)
WBC: 3.2 10*3/uL — ABNORMAL LOW (ref 3.4–10.8)

## 2018-12-26 MED ORDER — AMLODIPINE BESYLATE 5 MG PO TABS: 5.0000 mg | ORAL_TABLET | Freq: Every day | ORAL | 0 refills | Status: DC

## 2018-12-26 MED ORDER — LOSARTAN POTASSIUM 100 MG PO TABS: 100.0000 mg | ORAL_TABLET | Freq: Every day | ORAL | 3 refills | Status: DC

## 2018-12-26 NOTE — Progress Notes (Signed)
Established Patient Office Visit  Subjective:  Patient ID: [name] [name], female    DOB: 1971-06-25  Age: 48 y.o. MRN: [id number]  CC:  Chief Complaint  Patient presents with  . Annual Exam    Patient is here today for her annual physical. Patient also states she has been havuing problems with her right hip. Patient was informed the ins may not cover the hip as they would for the physical.. Patient also stated she do not think the med losartan is doing her any good and wants to know if she can go on something else    HPI [name] [name] presents for visit to establish care, CPE, and medication refills.  She has a medical history significant for HTN and IDA.  HTN is managed by Losartan 100mg  PO qd. She states that this has not been effective lately - her home BP is giving her readings of 160s/90s. She is interested in starting another agent. Her BP in the office today was borderline high. She states that she has gained weight lately as she has not been working. She is hopeful to lose weight with diet and exercise.  Otherwise, she is feeling well today overall. She is seeing her gynecologist early next week - she suspects she is perimenopausal given her intermittent and infrequent menses. She wants to discuss this with her GYN provider.  Routine labs will be ordered today - following up on a hx of anemia, CMP and Lipids also checked.  Past Medical History:  Diagnosis Date  . Anemia   . Fatty liver   . H. pylori infection   . HTN (hypertension)   . Hx of cardiovascular stress test    a. ETT-Myoview 05/28/12: Low risk study with a small, mild fixed basal inferior perfusion defect most likely representing soft tissue attenuation with inferolateral HK (wall motion abnormality does not correlate with inferior perfusion defect), EF 49%.  Marland Kitchen Hx of echocardiogram    a. Echocardiogram 06/11/12: EF 50-55%, normal wall motion, grade 1 diastolic dysfunction, trivial AI, mild MR, mild LAE, mild  RAE.  . RUQ pain     Past Surgical History:  Procedure Laterality Date  . NO PAST SURGERIES      Family History  Problem Relation Age of Onset  . Coronary artery disease Mother   . Other Brother        fluid around heart  . Other Other        hepatic issues-? maternal side  . Stomach cancer Neg Hx   . Colon cancer Neg Hx     Social History   Socioeconomic History  . Marital status: Married    Spouse name: Not on file  . Number of children: 4  . Years of education: Not on file  . Highest education level: Not on file  Occupational History  . Occupation: Housekeeper    Comment: at hotel    Employer: [employer]  Social Needs  . Financial resource strain: Not hard at all  . Food insecurity    Worry: Never true    Inability: Never true  . Transportation needs    Medical: No    Non-medical: No  Tobacco Use  . Smoking status: Never Smoker  . Smokeless tobacco: Never Used  Substance and Sexual Activity  . Alcohol use: No  . Drug use: No  . Sexual activity: Yes    Birth control/protection: None  Lifestyle  . Physical activity    Days per week: 0 days  Minutes per session: 0 min  . Stress: Not at all  Relationships  . Social Herbalist on phone: Three times a week    Gets together: Twice a week    Attends religious service: More than 4 times per year    Active member of club or organization: No    Attends meetings of clubs or organizations: Never    Relationship status: Married  . Intimate partner violence    Fear of current or ex partner: No    Emotionally abused: No    Physically abused: No    Forced sexual activity: No  Other Topics Concern  . Not on file  Social History Narrative  . Not on file    Outpatient Medications Prior to Visit  Medication Sig Dispense Refill  . losartan (COZAAR) 100 MG tablet Take 1 tablet (100 mg total) by mouth daily. 90 tablet 3   No facility-administered medications prior to visit.     Allergies   Allergen Reactions  . Hctz [Hydrochlorothiazide]     Heart palpitations    ROS Review of Systems  Constitutional: Negative.   HENT: Negative.   Eyes: Negative.   Respiratory: Negative.   Cardiovascular: Negative.   Gastrointestinal: Negative.   Endocrine: Negative.   Genitourinary: Positive for menstrual problem.  Musculoskeletal: Negative.   Skin: Negative.   Allergic/Immunologic: Negative.   Neurological: Negative.   Hematological: Negative.   Psychiatric/Behavioral: Negative.       Objective:    Physical Exam  Constitutional: She is oriented to person, place, and time. She appears well-developed and well-nourished. No distress.  HENT:  Head: Normocephalic and atraumatic.  Right Ear: External ear normal.  Left Ear: External ear normal.  Nose: Nose normal.  Mouth/Throat: Oropharynx is clear and moist. No oropharyngeal exudate.  Eyes: Pupils are equal, round, and reactive to light. Conjunctivae and EOM are normal. Right eye exhibits no discharge. Left eye exhibits no discharge. No scleral icterus.  Neck: Normal range of motion. Neck supple. No thyromegaly present.  Cardiovascular: Normal rate, regular rhythm, normal heart sounds and intact distal pulses. Exam reveals no gallop and no friction rub.  No murmur heard. Pulmonary/Chest: Effort normal and breath sounds normal. No respiratory distress. She has no wheezes. She has no rales. She exhibits no tenderness.  Abdominal: Soft. Bowel sounds are normal. She exhibits no distension and no mass. There is no abdominal tenderness. There is no rebound and no guarding.  Genitourinary:    Genitourinary Comments: Deferred by pt   Musculoskeletal: Normal range of motion.        General: No tenderness, deformity or edema.  Lymphadenopathy:    She has no cervical adenopathy.  Neurological: She is alert and oriented to person, place, and time. No cranial nerve deficit.  Skin: Skin is warm and dry. No rash noted. She is not  diaphoretic. No erythema. No pallor.  Psychiatric: She has a normal mood and affect. Her behavior is normal. Judgment and thought content normal.  Nursing note and vitals reviewed.   BP 140/89 (BP Location: Right Arm, Patient Position: Sitting, Cuff Size: Large)   Pulse 87   Temp 98.7 F (37.1 C) (Oral)   Resp 14   Wt 237 lb (107.5 kg)   SpO2 99%   BMI 40.68 kg/m  Wt Readings from Last 3 Encounters:  12/26/18 237 lb (107.5 kg)  06/10/18 217 lb 12.8 oz (98.8 kg)  12/13/17 207 lb 3.2 oz (94 kg)  There are no preventive care reminders to display for this patient.  There are no preventive care reminders to display for this patient.  Lab Results  Component Value Date   TSH 1.170 09/08/2017   Lab Results  Component Value Date   WBC 4.2 (A) 09/08/2017   HGB 9.5 (A) 09/08/2017   HCT 30.6 (A) 09/08/2017   MCV 84.3 09/08/2017   PLT 196 03/08/2017   Lab Results  Component Value Date   NA 140 12/13/2017   K 4.5 12/13/2017   CO2 24 12/13/2017   GLUCOSE 87 12/13/2017   BUN 9 12/13/2017   CREATININE 0.77 12/13/2017   BILITOT <0.2 12/13/2017   ALKPHOS 67 12/13/2017   AST 16 12/13/2017   ALT 17 12/13/2017   PROT 7.0 12/13/2017   ALBUMIN 4.0 12/13/2017   CALCIUM 9.2 12/13/2017   GFR 100.64 07/02/2012   Lab Results  Component Value Date   CHOL 212 (H) 05/05/2012   Lab Results  Component Value Date   HDL 81 05/05/2012   Lab Results  Component Value Date   LDLCALC 119 (H) 05/05/2012   Lab Results  Component Value Date   TRIG 59 05/05/2012   Lab Results  Component Value Date   CHOLHDL 2.6 05/05/2012   Lab Results  Component Value Date   HGBA1C 5.0 08/19/2016      Assessment & Plan:   Problem List Items Addressed This Visit      Cardiovascular and Mediastinum   Essential hypertension - Primary   Relevant Medications   amLODipine (NORVASC) 5 MG tablet   losartan (COZAAR) 100 MG tablet   Other Relevant Orders   Comprehensive metabolic panel     Other Visit Diagnoses    Hyperlipidemia, unspecified hyperlipidemia type       Relevant Medications   amLODipine (NORVASC) 5 MG tablet   losartan (COZAAR) 100 MG tablet   Other Relevant Orders   Lipid Panel   Iron deficiency anemia secondary to inadequate dietary iron intake       Relevant Orders   CBC with Differential/Platelet      Meds ordered this encounter  Medications  . amLODipine (NORVASC) 5 MG tablet    Sig: Take 1 tablet (5 mg total) by mouth daily.    Dispense:  90 tablet    Refill:  0    Order Specific Question:   Supervising Provider    Answer:   Delia Chimes A [id number]  . losartan (COZAAR) 100 MG tablet    Sig: Take 1 tablet (100 mg total) by mouth daily.    Dispense:  90 tablet    Refill:  3    Order Specific Question:   Supervising Provider    Answer:   Forrest Moron [id number]    Follow-up: Return in 3 months (on 03/28/2019) for Med check - BP check.   PLAN:  Past labs reviewed. Labs ordered today, will follow up as warranted.  Normal findings on exam. She will follow with GYN provider for exam on Monday and discussion of menopause.  Start Amlodipine 5mg  PO qd. She will return in 3 mos for med check - ideally bringing her home monitor to calibrate vs our monitors in office.  Patient encouraged to call clinic with any questions, comments, or concerns.    Maximiano Coss, NP

## 2018-12-26 NOTE — Patient Instructions (Addendum)
   If you have lab work done today you will be contacted with your lab results within the next 2 weeks.  If you have not heard from us then please contact us. The fastest way to get your results is to register for My Chart.   IF you received an x-ray today, you will receive an invoice from [facility]. Please contact [facility] at [phone] with questions or concerns regarding your invoice.   IF you received labwork today, you will receive an invoice from [facility]. Please contact [facility] at [phone] with questions or concerns regarding your invoice.   Our billing staff will not be able to assist you with questions regarding bills from these companies.  You will be contacted with the lab results as soon as they are available. The fastest way to get your results is to activate your My Chart account. Instructions are located on the last page of this paperwork. If you have not heard from us regarding the results in 2 weeks, please contact this office.       Hypertension Hypertension, commonly called high blood pressure, is when the force of blood pumping through the arteries is too strong. The arteries are the blood vessels that carry blood from the heart throughout the body. Hypertension forces the heart to work harder to pump blood and may cause arteries to become narrow or stiff. Having untreated or uncontrolled hypertension can cause heart attacks, strokes, kidney disease, and other problems. A blood pressure reading consists of a higher number over a lower number. Ideally, your blood pressure should be below 120/80. The first ("top") number is called the systolic pressure. It is a measure of the pressure in your arteries as your heart beats. The second ("bottom") number is called the diastolic pressure. It is a measure of the pressure in your arteries as the heart relaxes. What are the causes? The cause of this condition is not known. What increases the  risk? Some risk factors for high blood pressure are under your control. Others are not. Factors you can change  Smoking.  Having type 2 diabetes mellitus, high cholesterol, or both.  Not getting enough exercise or physical activity.  Being overweight.  Having too much fat, sugar, calories, or salt (sodium) in your diet.  Drinking too much alcohol. Factors that are difficult or impossible to change  Having chronic kidney disease.  Having a family history of high blood pressure.  Age. Risk increases with age.  Race. You may be at higher risk if you are African-American.  Gender. Men are at higher risk than women before age 45. After age 65, women are at higher risk than men.  Having obstructive sleep apnea.  Stress. What are the signs or symptoms? Extremely high blood pressure (hypertensive crisis) may cause:  Headache.  Anxiety.  Shortness of breath.  Nosebleed.  Nausea and vomiting.  Severe chest pain.  Jerky movements you cannot control (seizures). How is this diagnosed? This condition is diagnosed by measuring your blood pressure while you are seated, with your arm resting on a surface. The cuff of the blood pressure monitor will be placed directly against the skin of your upper arm at the level of your heart. It should be measured at least twice using the same arm. Certain conditions can cause a difference in blood pressure between your right and left arms. Certain factors can cause blood pressure readings to be lower or higher than normal (elevated) for a short period of time:    When your blood pressure is higher when you are in a health care provider's office than when you are at home, this is called white coat hypertension. Most people with this condition do not need medicines.  When your blood pressure is higher at home than when you are in a health care provider's office, this is called masked hypertension. Most people with this condition may need medicines  to control blood pressure. If you have a high blood pressure reading during one visit or you have normal blood pressure with other risk factors:  You may be asked to return on a different day to have your blood pressure checked again.  You may be asked to monitor your blood pressure at home for 1 week or longer. If you are diagnosed with hypertension, you may have other blood or imaging tests to help your health care provider understand your overall risk for other conditions. How is this treated? This condition is treated by making healthy lifestyle changes, such as eating healthy foods, exercising more, and reducing your alcohol intake. Your health care provider may prescribe medicine if lifestyle changes are not enough to get your blood pressure under control, and if:  Your systolic blood pressure is above 130.  Your diastolic blood pressure is above 80. Your personal target blood pressure may vary depending on your medical conditions, your age, and other factors. Follow these instructions at home: Eating and drinking   Eat a diet that is high in fiber and potassium, and low in sodium, added sugar, and fat. An example eating plan is called the DASH (Dietary Approaches to Stop Hypertension) diet. To eat this way: ? Eat plenty of fresh fruits and vegetables. Try to fill half of your plate at each meal with fruits and vegetables. ? Eat whole grains, such as whole wheat pasta, [place] rice, or whole grain bread. Fill about one quarter of your plate with whole grains. ? Eat or drink low-fat dairy products, such as skim milk or low-fat yogurt. ? Avoid fatty cuts of meat, processed or cured meats, and poultry with skin. Fill about one quarter of your plate with lean proteins, such as fish, chicken without skin, beans, eggs, and tofu. ? Avoid premade and processed foods. These tend to be higher in sodium, added sugar, and fat.  Reduce your daily sodium intake. Most people with hypertension should  eat less than 1,500 mg of sodium a day.  Limit alcohol intake to no more than 1 drink a day for nonpregnant women and 2 drinks a day for men. One drink equals 12 oz of beer, 5 oz of wine, or 1 oz of hard liquor. Lifestyle   Work with your health care provider to maintain a healthy body weight or to lose weight. Ask what an ideal weight is for you.  Get at least 30 minutes of exercise that causes your heart to beat faster (aerobic exercise) most days of the week. Activities may include walking, swimming, or biking.  Include exercise to strengthen your muscles (resistance exercise), such as pilates or lifting weights, as part of your weekly exercise routine. Try to do these types of exercises for 30 minutes at least 3 days a week.  Do not use any products that contain nicotine or tobacco, such as cigarettes and e-cigarettes. If you need help quitting, ask your health care provider.  Monitor your blood pressure at home as told by your health care provider.  Keep all follow-up visits as told by your health care provider.   This is important. Medicines  Take over-the-counter and prescription medicines only as told by your health care provider. Follow directions carefully. Blood pressure medicines must be taken as prescribed.  Do not skip doses of blood pressure medicine. Doing this puts you at risk for problems and can make the medicine less effective.  Ask your health care provider about side effects or reactions to medicines that you should watch for. Contact a health care provider if:  You think you are having a reaction to a medicine you are taking.  You have headaches that keep coming back (recurring).  You feel dizzy.  You have swelling in your ankles.  You have trouble with your vision. Get help right away if:  You develop a severe headache or confusion.  You have unusual weakness or numbness.  You feel faint.  You have severe pain in your chest or abdomen.  You vomit  repeatedly.  You have trouble breathing. Summary  Hypertension is when the force of blood pumping through your arteries is too strong. If this condition is not controlled, it may put you at risk for serious complications.  Your personal target blood pressure may vary depending on your medical conditions, your age, and other factors. For most people, a normal blood pressure is less than 120/80.  Hypertension is treated with lifestyle changes, medicines, or a combination of both. Lifestyle changes include weight loss, eating a healthy, low-sodium diet, exercising more, and limiting alcohol. This information is not intended to replace advice given to you by your health care provider. Make sure you discuss any questions you have with your health care provider. Document Released: 06/19/2005 Document Revised: 05/17/2016 Document Reviewed: 05/17/2016 Elsevier Interactive Patient Education  2019 [employer].   Managing Your Hypertension Hypertension is commonly called high blood pressure. This is when the force of your blood pressing against the walls of your arteries is too strong. Arteries are blood vessels that carry blood from your heart throughout your body. Hypertension forces the heart to work harder to pump blood, and may cause the arteries to become narrow or stiff. Having untreated or uncontrolled hypertension can cause heart attack, stroke, kidney disease, and other problems. What are blood pressure readings? A blood pressure reading consists of a higher number over a lower number. Ideally, your blood pressure should be below 120/80. The first ("top") number is called the systolic pressure. It is a measure of the pressure in your arteries as your heart beats. The second ("bottom") number is called the diastolic pressure. It is a measure of the pressure in your arteries as the heart relaxes. What does my blood pressure reading mean? Blood pressure is classified into four stages. Based on your  blood pressure reading, your health care provider may use the following stages to determine what type of treatment you need, if any. Systolic pressure and diastolic pressure are measured in a unit called mm Hg. Normal  Systolic pressure: below 330.  Diastolic pressure: below 80. Elevated  Systolic pressure: 076-226.  Diastolic pressure: below 80. Hypertension stage 1  Systolic pressure: 333-545.  Diastolic pressure: 62-56. Hypertension stage 2  Systolic pressure: 389 or above.  Diastolic pressure: 90 or above. What health risks are associated with hypertension? Managing your hypertension is an important responsibility. Uncontrolled hypertension can lead to:  A heart attack.  A stroke.  A weakened blood vessel (aneurysm).  Heart failure.  Kidney damage.  Eye damage.  Metabolic syndrome.  Memory and concentration problems. What changes can I make  to manage my hypertension? Hypertension can be managed by making lifestyle changes and possibly by taking medicines. Your health care provider will help you make a plan to bring your blood pressure within a normal range. Eating and drinking   Eat a diet that is high in fiber and potassium, and low in salt (sodium), added sugar, and fat. An example eating plan is called the DASH (Dietary Approaches to Stop Hypertension) diet. To eat this way: ? Eat plenty of fresh fruits and vegetables. Try to fill half of your plate at each meal with fruits and vegetables. ? Eat whole grains, such as whole wheat pasta, brown rice, or whole grain bread. Fill about one quarter of your plate with whole grains. ? Eat low-fat diary products. ? Avoid fatty cuts of meat, processed or cured meats, and poultry with skin. Fill about one quarter of your plate with lean proteins such as fish, chicken without skin, beans, eggs, and tofu. ? Avoid premade and processed foods. These tend to be higher in sodium, added sugar, and fat.  Reduce your daily sodium  intake. Most people with hypertension should eat less than 1,500 mg of sodium a day.  Limit alcohol intake to no more than 1 drink a day for nonpregnant women and 2 drinks a day for men. One drink equals 12 oz of beer, 5 oz of wine, or 1 oz of hard liquor. Lifestyle  Work with your health care provider to maintain a healthy body weight, or to lose weight. Ask what an ideal weight is for you.  Get at least 30 minutes of exercise that causes your heart to beat faster (aerobic exercise) most days of the week. Activities may include walking, swimming, or biking.  Include exercise to strengthen your muscles (resistance exercise), such as weight lifting, as part of your weekly exercise routine. Try to do these types of exercises for 30 minutes at least 3 days a week.  Do not use any products that contain nicotine or tobacco, such as cigarettes and e-cigarettes. If you need help quitting, ask your health care provider.  Control any long-term (chronic) conditions you have, such as high cholesterol or diabetes. Monitoring  Monitor your blood pressure at home as told by your health care provider. Your personal target blood pressure may vary depending on your medical conditions, your age, and other factors.  Have your blood pressure checked regularly, as often as told by your health care provider. Working with your health care provider  Review all the medicines you take with your health care provider because there may be side effects or interactions.  Talk with your health care provider about your diet, exercise habits, and other lifestyle factors that may be contributing to hypertension.  Visit your health care provider regularly. Your health care provider can help you create and adjust your plan for managing hypertension. Will I need medicine to control my blood pressure? Your health care provider may prescribe medicine if lifestyle changes are not enough to get your blood pressure under control,  and if:  Your systolic blood pressure is 130 or higher.  Your diastolic blood pressure is 80 or higher. Take medicines only as told by your health care provider. Follow the directions carefully. Blood pressure medicines must be taken as prescribed. The medicine does not work as well when you skip doses. Skipping doses also puts you at risk for problems. Contact a health care provider if:  You think you are having a reaction to medicines you have  taken.  You have repeated (recurrent) headaches.  You feel dizzy.  You have swelling in your ankles.  You have trouble with your vision. Get help right away if:  You develop a severe headache or confusion.  You have unusual weakness or numbness, or you feel faint.  You have severe pain in your chest or abdomen.  You vomit repeatedly.  You have trouble breathing. Summary  Hypertension is when the force of blood pumping through your arteries is too strong. If this condition is not controlled, it may put you at risk for serious complications.  Your personal target blood pressure may vary depending on your medical conditions, your age, and other factors. For most people, a normal blood pressure is less than 120/80.  Hypertension is managed by lifestyle changes, medicines, or both. Lifestyle changes include weight loss, eating a healthy, low-sodium diet, exercising more, and limiting alcohol. This information is not intended to replace advice given to you by your health care provider. Make sure you discuss any questions you have with your health care provider. Document Released: 03/13/2012 Document Revised: 05/17/2016 Document Reviewed: 05/17/2016 Elsevier Interactive Patient Education  2019 [place] DASH stands for "Dietary Approaches to Stop Hypertension." The DASH eating plan is a healthy eating plan that has been shown to reduce high blood pressure (hypertension). It may also reduce your risk for type 2  diabetes, heart disease, and stroke. The DASH eating plan may also help with weight loss. What are tips for following this plan?  General guidelines  Avoid eating more than 2,300 mg (milligrams) of salt (sodium) a day. If you have hypertension, you may need to reduce your sodium intake to 1,500 mg a day.  Limit alcohol intake to no more than 1 drink a day for nonpregnant women and 2 drinks a day for men. One drink equals 12 oz of beer, 5 oz of wine, or 1 oz of hard liquor.  Work with your health care provider to maintain a healthy body weight or to lose weight. Ask what an ideal weight is for you.  Get at least 30 minutes of exercise that causes your heart to beat faster (aerobic exercise) most days of the week. Activities may include walking, swimming, or biking.  Work with your health care provider or diet and nutrition specialist (dietitian) to adjust your eating plan to your individual calorie needs. Reading food labels   Check food labels for the amount of sodium per serving. Choose foods with less than 5 percent of the Daily Value of sodium. Generally, foods with less than 300 mg of sodium per serving fit into this eating plan.  To find whole grains, look for the word "whole" as the first word in the ingredient list. Shopping  Buy products labeled as "low-sodium" or "no salt added."  Buy fresh foods. Avoid canned foods and premade or frozen meals. Cooking  Avoid adding salt when cooking. Use salt-free seasonings or herbs instead of table salt or sea salt. Check with your health care provider or pharmacist before using salt substitutes.  Do not fry foods. Cook foods using healthy methods such as baking, boiling, grilling, and broiling instead.  Cook with heart-healthy oils, such as olive, canola, soybean, or sunflower oil. Meal planning  Eat a balanced diet that includes: ? 5 or more servings of fruits and vegetables each day. At each meal, try to fill half of your plate with  fruits and vegetables. ? Up to 6-8 servings of  whole grains each day. ? Less than 6 oz of lean meat, poultry, or fish each day. A 3-oz serving of meat is about the same size as a deck of cards. One egg equals 1 oz. ? 2 servings of low-fat dairy each day. ? A serving of nuts, seeds, or beans 5 times each week. ? Heart-healthy fats. Healthy fats called Omega-3 fatty acids are found in foods such as flaxseeds and coldwater fish, like sardines, salmon, and mackerel.  Limit how much you eat of the following: ? Canned or prepackaged foods. ? Food that is high in trans fat, such as fried foods. ? Food that is high in saturated fat, such as fatty meat. ? Sweets, desserts, sugary drinks, and other foods with added sugar. ? Full-fat dairy products.  Do not salt foods before eating.  Try to eat at least 2 vegetarian meals each week.  Eat more home-cooked food and less restaurant, buffet, and fast food.  When eating at a restaurant, ask that your food be prepared with less salt or no salt, if possible. What foods are recommended? The items listed may not be a complete list. Talk with your dietitian about what dietary choices are best for you. Grains Whole-grain or whole-wheat bread. Whole-grain or whole-wheat pasta. Brown rice. [place]. Bulgur. Whole-grain and low-sodium cereals. Pita bread. Low-fat, low-sodium crackers. Whole-wheat flour tortillas. Vegetables Fresh or frozen vegetables (raw, steamed, roasted, or grilled). Low-sodium or reduced-sodium tomato and vegetable juice. Low-sodium or reduced-sodium tomato sauce and tomato paste. Low-sodium or reduced-sodium canned vegetables. Fruits All fresh, dried, or frozen fruit. Canned fruit in natural juice (without added sugar). Meat and other protein foods Skinless chicken or [place]. Ground chicken or [place]. Pork with fat trimmed off. Fish and seafood. Egg whites. Dried beans, peas, or lentils. Unsalted nuts, nut butters, and seeds.  Unsalted canned beans. Lean cuts of beef with fat trimmed off. Low-sodium, lean deli meat. Dairy Low-fat (1%) or fat-free (skim) milk. Fat-free, low-fat, or reduced-fat cheeses. Nonfat, low-sodium ricotta or cottage cheese. Low-fat or nonfat yogurt. Low-fat, low-sodium cheese. Fats and oils Soft margarine without trans fats. Vegetable oil. Low-fat, reduced-fat, or light mayonnaise and salad dressings (reduced-sodium). Canola, safflower, olive, soybean, and sunflower oils. Avocado. Seasoning and other foods Herbs. Spices. Seasoning mixes without salt. Unsalted popcorn and pretzels. Fat-free sweets. What foods are not recommended? The items listed may not be a complete list. Talk with your dietitian about what dietary choices are best for you. Grains Baked goods made with fat, such as croissants, muffins, or some breads. Dry pasta or rice meal packs. Vegetables Creamed or fried vegetables. Vegetables in a cheese sauce. Regular canned vegetables (not low-sodium or reduced-sodium). Regular canned tomato sauce and paste (not low-sodium or reduced-sodium). Regular tomato and vegetable juice (not low-sodium or reduced-sodium). Angie Fava. Olives. Fruits Canned fruit in a light or heavy syrup. Fried fruit. Fruit in cream or butter sauce. Meat and other protein foods Fatty cuts of meat. Ribs. Fried meat. Berniece Salines. Sausage. Bologna and other processed lunch meats. Salami. Fatback. Hotdogs. Bratwurst. Salted nuts and seeds. Canned beans with added salt. Canned or smoked fish. Whole eggs or egg yolks. Chicken or [place] with skin. Dairy Whole or 2% milk, cream, and half-and-half. Whole or full-fat cream cheese. Whole-fat or sweetened yogurt. Full-fat cheese. Nondairy creamers. Whipped toppings. Processed cheese and cheese spreads. Fats and oils Butter. Stick margarine. Lard. Shortening. Ghee. Bacon fat. Tropical oils, such as coconut, palm kernel, or palm oil. Seasoning and other foods Salted popcorn  and  pretzels. Onion salt, garlic salt, seasoned salt, table salt, and sea salt. Worcestershire sauce. Tartar sauce. Barbecue sauce. Teriyaki sauce. Soy sauce, including reduced-sodium. Steak sauce. Canned and packaged gravies. Fish sauce. Oyster sauce. Cocktail sauce. Horseradish that you find on the shelf. Ketchup. Mustard. Meat flavorings and tenderizers. Bouillon cubes. Hot sauce and Tabasco sauce. Premade or packaged marinades. Premade or packaged taco seasonings. Relishes. Regular salad dressings. Where to find more information:  National Heart, Lung, and [place]: https://wilson-eaton.com/  American Heart Association: www.heart.org Summary  The DASH eating plan is a healthy eating plan that has been shown to reduce high blood pressure (hypertension). It may also reduce your risk for type 2 diabetes, heart disease, and stroke.  With the DASH eating plan, you should limit salt (sodium) intake to 2,300 mg a day. If you have hypertension, you may need to reduce your sodium intake to 1,500 mg a day.  When on the DASH eating plan, aim to eat more fresh fruits and vegetables, whole grains, lean proteins, low-fat dairy, and heart-healthy fats.  Work with your health care provider or diet and nutrition specialist (dietitian) to adjust your eating plan to your individual calorie needs. This information is not intended to replace advice given to you by your health care provider. Make sure you discuss any questions you have with your health care provider. Document Released: 06/08/2011 Document Revised: 06/12/2016 Document Reviewed: 06/12/2016 Elsevier Interactive Patient Education  2019 [employer].   Low-Sodium Eating Plan Sodium, which is an element that makes up salt, helps you maintain a healthy balance of fluids in your body. Too much sodium can increase your blood pressure and cause fluid and waste to be held in your body. Your health care provider or dietitian may recommend following this plan if  you have high blood pressure (hypertension), kidney disease, liver disease, or heart failure. Eating less sodium can help lower your blood pressure, reduce swelling, and protect your heart, liver, and kidneys. What are tips for following this plan? General guidelines  Most people on this plan should limit their sodium intake to 1,500-2,000 mg (milligrams) of sodium each day. Reading food labels   The Nutrition Facts label lists the amount of sodium in one serving of the food. If you eat more than one serving, you must multiply the listed amount of sodium by the number of servings.  Choose foods with less than 140 mg of sodium per serving.  Avoid foods with 300 mg of sodium or more per serving. Shopping  Look for lower-sodium products, often labeled as "low-sodium" or "no salt added."  Always check the sodium content even if foods are labeled as "unsalted" or "no salt added".  Buy fresh foods. ? Avoid canned foods and premade or frozen meals. ? Avoid canned, cured, or processed meats  Buy breads that have less than 80 mg of sodium per slice. Cooking  Eat more home-cooked food and less restaurant, buffet, and fast food.  Avoid adding salt when cooking. Use salt-free seasonings or herbs instead of table salt or sea salt. Check with your health care provider or pharmacist before using salt substitutes.  Cook with plant-based oils, such as canola, sunflower, or olive oil. Meal planning  When eating at a restaurant, ask that your food be prepared with less salt or no salt, if possible.  Avoid foods that contain MSG (monosodium glutamate). MSG is sometimes added to [place] food, bouillon, and some canned foods. What foods are recommended? The items listed may  not be a complete list. Talk with your dietitian about what dietary choices are best for you. Grains Low-sodium cereals, including oats, puffed wheat and rice, and shredded wheat. Low-sodium crackers. Unsalted rice. Unsalted  pasta. Low-sodium bread. Whole-grain breads and whole-grain pasta. Vegetables Fresh or frozen vegetables. "No salt added" canned vegetables. "No salt added" tomato sauce and paste. Low-sodium or reduced-sodium tomato and vegetable juice. Fruits Fresh, frozen, or canned fruit. Fruit juice. Meats and other protein foods Fresh or frozen (no salt added) meat, poultry, seafood, and fish. Low-sodium canned tuna and salmon. Unsalted nuts. Dried peas, beans, and lentils without added salt. Unsalted canned beans. Eggs. Unsalted nut butters. Dairy Milk. Soy milk. Cheese that is naturally low in sodium, such as ricotta cheese, fresh mozzarella, or Swiss cheese Low-sodium or reduced-sodium cheese. Cream cheese. Yogurt. Fats and oils Unsalted butter. Unsalted margarine with no trans fat. Vegetable oils such as canola or olive oils. Seasonings and other foods Fresh and dried herbs and spices. Salt-free seasonings. Low-sodium mustard and ketchup. Sodium-free salad dressing. Sodium-free light mayonnaise. Fresh or refrigerated horseradish. Lemon juice. Vinegar. Homemade, reduced-sodium, or low-sodium soups. Unsalted popcorn and pretzels. Low-salt or salt-free chips. What foods are not recommended? The items listed may not be a complete list. Talk with your dietitian about what dietary choices are best for you. Grains Instant hot cereals. Bread stuffing, pancake, and biscuit mixes. Croutons. Seasoned rice or pasta mixes. Noodle soup cups. Boxed or frozen macaroni and cheese. Regular salted crackers. Self-rising flour. Vegetables Sauerkraut, pickled vegetables, and relishes. Olives. [place] fries. Onion rings. Regular canned vegetables (not low-sodium or reduced-sodium). Regular canned tomato sauce and paste (not low-sodium or reduced-sodium). Regular tomato and vegetable juice (not low-sodium or reduced-sodium). Frozen vegetables in sauces. Meats and other protein foods Meat or fish that is salted, canned, smoked,  spiced, or pickled. Bacon, ham, sausage, hotdogs, corned beef, chipped beef, packaged lunch meats, salt pork, jerky, pickled herring, anchovies, regular canned tuna, sardines, salted nuts. Dairy Processed cheese and cheese spreads. Cheese curds. Blue cheese. Feta cheese. String cheese. Regular cottage cheese. Buttermilk. Canned milk. Fats and oils Salted butter. Regular margarine. Ghee. Bacon fat. Seasonings and other foods Onion salt, garlic salt, seasoned salt, table salt, and sea salt. Canned and packaged gravies. Worcestershire sauce. Tartar sauce. Barbecue sauce. Teriyaki sauce. Soy sauce, including reduced-sodium. Steak sauce. Fish sauce. Oyster sauce. Cocktail sauce. Horseradish that you find on the shelf. Regular ketchup and mustard. Meat flavorings and tenderizers. Bouillon cubes. Hot sauce and Tabasco sauce. Premade or packaged marinades. Premade or packaged taco seasonings. Relishes. Regular salad dressings. Salsa. Potato and tortilla chips. Corn chips and puffs. Salted popcorn and pretzels. Canned or dried soups. Pizza. Frozen entrees and pot pies. Summary  Eating less sodium can help lower your blood pressure, reduce swelling, and protect your heart, liver, and kidneys.  Most people on this plan should limit their sodium intake to 1,500-2,000 mg (milligrams) of sodium each day.  Canned, boxed, and frozen foods are high in sodium. Restaurant foods, fast foods, and pizza are also very high in sodium. You also get sodium by adding salt to food.  Try to cook at home, eat more fresh fruits and vegetables, and eat less fast food, canned, processed, or prepared foods. This information is not intended to replace advice given to you by your health care provider. Make sure you discuss any questions you have with your health care provider. Document Released: 12/09/2001 Document Revised: 06/12/2016 Document Reviewed: 06/12/2016 Elsevier Interactive Patient Education  2019 [place].     Why  follow it? Research shows. . Those who follow the Mediterranean diet have a reduced risk of heart disease  . The diet is associated with a reduced incidence of Parkinson's and Alzheimer's diseases . People following the diet may have longer life expectancies and lower rates of chronic diseases  . The Dietary Guidelines for Americans recommends the Mediterranean diet as an eating plan to promote health and prevent disease  What Is the Mediterranean Diet?  . Healthy eating plan based on typical foods and recipes of Mediterranean-style cooking . The diet is primarily a plant based diet; these foods should make up a majority of meals   Starches - Plant based foods should make up a majority of meals - They are an important sources of vitamins, minerals, energy, antioxidants, and fiber - Choose whole grains, foods high in fiber and minimally processed items  - Typical grain sources include wheat, oats, barley, corn, brown rice, bulgar, farro, millet, polenta, couscous  - Various types of beans include chickpeas, lentils, fava beans, black beans, white beans   Fruits  Veggies - Large quantities of antioxidant rich fruits & veggies; 6 or more servings  - Vegetables can be eaten raw or lightly drizzled with oil and cooked  - Vegetables common to the traditional Mediterranean Diet include: artichokes, arugula, beets, broccoli, brussel sprouts, cabbage, carrots, celery, collard greens, cucumbers, eggplant, kale, leeks, lemons, lettuce, mushrooms, okra, onions, peas, peppers, potatoes, pumpkin, radishes, rutabaga, shallots, spinach, sweet potatoes, turnips, zucchini - Fruits common to the Mediterranean Diet include: apples, apricots, avocados, cherries, clementines, dates, figs, grapefruits, grapes, melons, nectarines, oranges, peaches, pears, pomegranates, strawberries, tangerines  Fats - Replace butter and margarine with healthy oils, such as olive oil, canola oil, and tahini  - Limit nuts to no more than  a handful a day  - Nuts include walnuts, almonds, pecans, pistachios, pine nuts  - Limit or avoid candied, honey roasted or heavily salted nuts - Olives are central to the [employer] - can be eaten whole or used in a variety of dishes   Meats Protein - Limiting red meat: no more than a few times a month - When eating red meat: choose lean cuts and keep the portion to the size of deck of cards - Eggs: approx. 0 to 4 times a week  - Fish and lean poultry: at least 2 a week  - Healthy protein sources include, chicken, [place], lean beef, lamb - Increase intake of seafood such as tuna, salmon, trout, mackerel, shrimp, scallops - Avoid or limit high fat processed meats such as sausage and bacon  Dairy - Include moderate amounts of low fat dairy products  - Focus on healthy dairy such as fat free yogurt, skim milk, low or reduced fat cheese - Limit dairy products higher in fat such as whole or 2% milk, cheese, ice cream  Alcohol - Moderate amounts of red wine is ok  - No more than 5 oz daily for women (all ages) and men older than age 37  - No more than 10 oz of wine daily for men younger than 15  Other - Limit sweets and other desserts  - Use herbs and spices instead of salt to flavor foods  - Herbs and spices common to the traditional Mediterranean Diet include: basil, bay leaves, chives, cloves, cumin, fennel, garlic, lavender, marjoram, mint, oregano, parsley, pepper, rosemary, sage, savory, sumac, tarragon, thyme   It's not just a diet, it's  a lifestyle:  . The Mediterranean diet includes lifestyle factors typical of those in the region  . Foods, drinks and meals are best eaten with others and savored . Daily physical activity is important for overall good health . This could be strenuous exercise like running and aerobics . This could also be more leisurely activities such as walking, housework, yard-work, or taking the stairs . Moderation is the key; a balanced and healthy diet  accommodates most foods and drinks . Consider portion sizes and frequency of consumption of certain foods   Meal Ideas & Options:  . Breakfast:  o Whole wheat toast or whole wheat English muffins with peanut butter & hard boiled egg o Steel cut oats topped with apples & cinnamon and skim milk  o Fresh fruit: banana, strawberries, melon, berries, peaches  o Smoothies: strawberries, bananas, greek yogurt, peanut butter o Low fat greek yogurt with blueberries and granola  o Egg white omelet with spinach and mushrooms o Breakfast couscous: whole wheat couscous, apricots, skim milk, cranberries  . Sandwiches:  o Hummus and grilled vegetables (peppers, zucchini, squash) on whole wheat bread   o Grilled chicken on whole wheat pita with lettuce, tomatoes, cucumbers or tzatziki  o Tuna salad on whole wheat bread: tuna salad made with greek yogurt, olives, red peppers, capers, green onions o Garlic rosemary lamb pita: lamb sauted with garlic, rosemary, salt & pepper; add lettuce, cucumber, greek yogurt to pita - flavor with lemon juice and black pepper  . Seafood:  o Mediterranean grilled salmon, seasoned with garlic, basil, parsley, lemon juice and black pepper o Shrimp, lemon, and spinach whole-grain pasta salad made with low fat greek yogurt  o Seared scallops with lemon orzo  o Seared tuna steaks seasoned salt, pepper, coriander topped with tomato mixture of olives, tomatoes, olive oil, minced garlic, parsley, green onions and cappers  . Meats:  o Herbed greek chicken salad with kalamata olives, cucumber, feta  o Red bell peppers stuffed with spinach, bulgur, lean ground beef (or lentils) & topped with feta   o Kebabs: skewers of chicken, tomatoes, onions, zucchini, squash  o [place] burgers: made with red onions, mint, dill, lemon juice, feta cheese topped with roasted red peppers . Vegetarian o Cucumber salad: cucumbers, artichoke hearts, celery, red onion, feta cheese, tossed in olive oil &  lemon juice  o Hummus and whole grain pita points with a greek salad (lettuce, tomato, feta, olives, cucumbers, red onion) o Lentil soup with celery, carrots made with vegetable broth, garlic, salt and pepper  o Tabouli salad: parsley, bulgur, mint, scallions, cucumbers, tomato, radishes, lemon juice, olive oil, salt and pepper.

## 2018-12-30 NOTE — Progress Notes (Signed)
Called patient to discuss results - she was seen in ED for dizzy spells, at which time she was assumed for follow up for anemia at that location. They drew labs, likely an iron panel, and will follow up there. I requested that she have the labs faxed to me and she could resume follow up in our office.  Kathrin Ruddy, NP

## 2019-01-10 ENCOUNTER — Other Ambulatory Visit (HOSPITAL_COMMUNITY): Payer: Self-pay | Admitting: *Deleted

## 2019-01-13 ENCOUNTER — Other Ambulatory Visit: Payer: Self-pay

## 2019-01-13 ENCOUNTER — Ambulatory Visit (HOSPITAL_COMMUNITY)
Admission: RE | Admit: 2019-01-13 | Discharge: 2019-01-13 | Disposition: A | Discharge status: Home / Self Care | Payer: 59 | Source: Ambulatory Visit | Attending: Obstetrics and Gynecology | Admitting: Obstetrics and Gynecology

## 2019-01-13 DIAGNOSIS — D649 Anemia, unspecified: Secondary | ICD-10-CM | POA: Diagnosis not present

## 2019-01-13 MED ORDER — SODIUM CHLORIDE 0.9 % IV SOLN
510.0000 mg | INTRAVENOUS | Status: DC
  Administered 2019-01-13: 13:00:00 510 mg via INTRAVENOUS
  Filled 2019-01-13: qty 17

## 2019-01-13 NOTE — Discharge Instructions (Signed)

## 2019-01-16 ENCOUNTER — Ambulatory Visit (HOSPITAL_COMMUNITY)
Admission: RE | Admit: 2019-01-16 | Discharge: 2019-01-16 | Disposition: A | Discharge status: Home / Self Care | Payer: 59 | Source: Ambulatory Visit | Attending: Obstetrics and Gynecology | Admitting: Obstetrics and Gynecology

## 2019-01-16 ENCOUNTER — Other Ambulatory Visit: Payer: Self-pay

## 2019-01-16 DIAGNOSIS — D509 Iron deficiency anemia, unspecified: Secondary | ICD-10-CM | POA: Insufficient documentation

## 2019-01-16 MED ORDER — SODIUM CHLORIDE 0.9 % IV SOLN
510.0000 mg | INTRAVENOUS | Status: DC
  Administered 2019-01-16: 510 mg via INTRAVENOUS
  Filled 2019-01-16: qty 17

## 2019-02-15 IMAGING — US US PELVIS COMPLETE TRANSABD/TRANSVAG
1 series · 13 of 25 positions shown · non-contrast
Comparison: None.

CLINICAL DATA: Chronic right lower quadrant pain, ovarian cyst

EXAM:
TRANSABDOMINAL AND TRANSVAGINAL ULTRASOUND OF PELVIS
DOPPLER ULTRASOUND OF OVARIES
TECHNIQUE: Both transabdominal and transvaginal ultrasound examinations of the
pelvis were performed. Transabdominal technique was performed for
global imaging of the pelvis including uterus, ovaries, adnexal
regions, and pelvic cul-de-sac.
It was necessary to proceed with endovaginal exam following the
transabdominal exam to visualize the endometrium. Color and duplex
Doppler ultrasound was utilized to evaluate blood flow to the
ovaries.

[Series 1: us pelvis complete transabd/transvag · 0.20mm/px · 292 acquisitions, 13 frames shown]
[im 1/292]
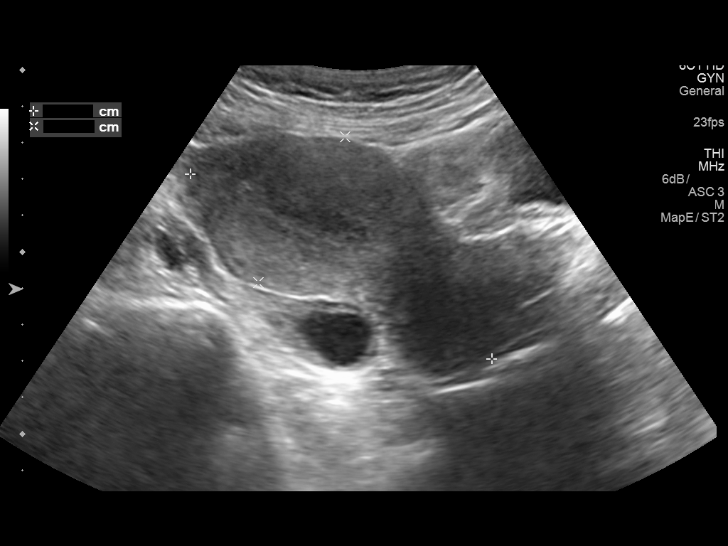
[im 25/292]
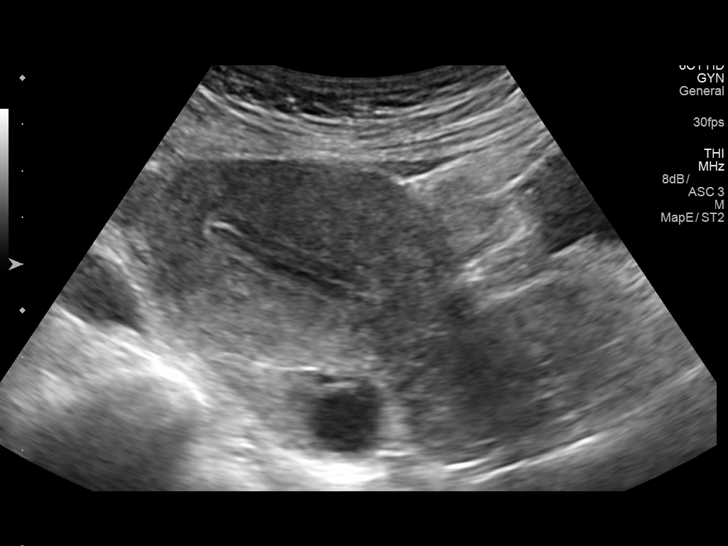
[im 49/292]
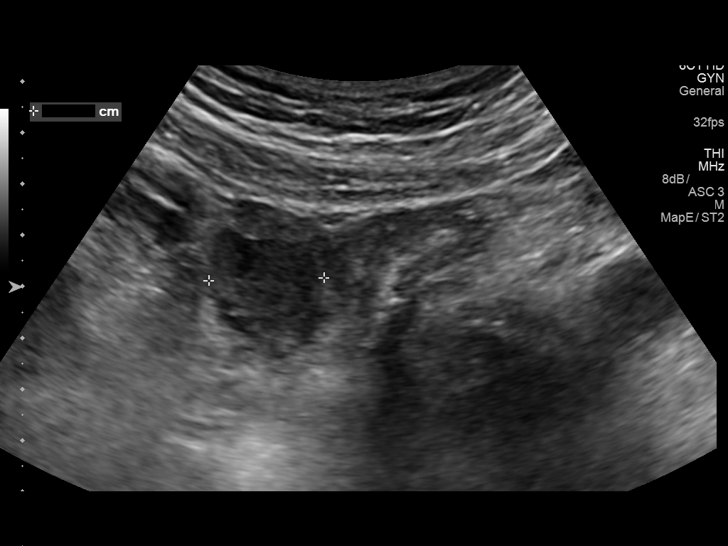
[im 73/292]
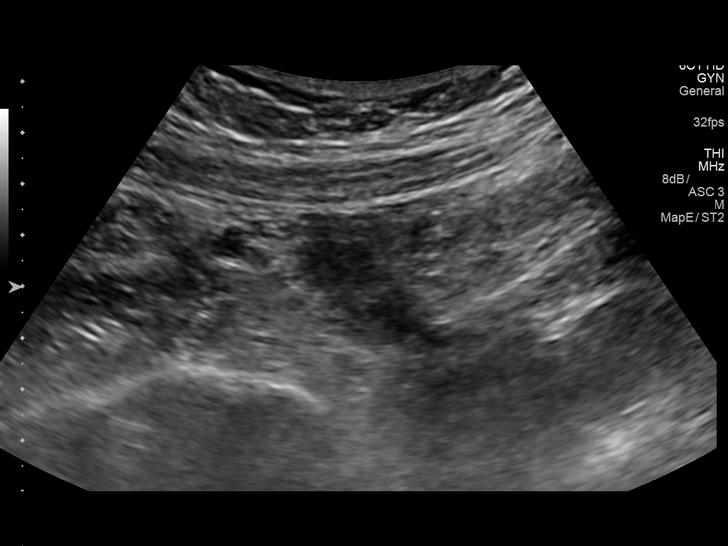
[im 98/292]
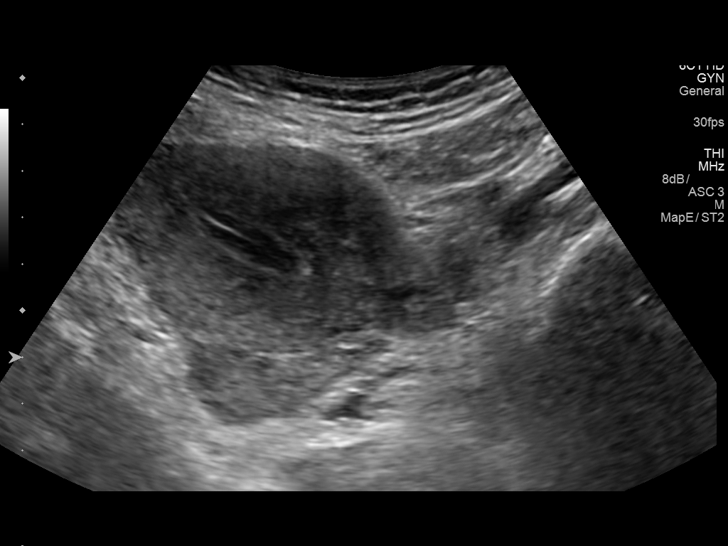
[im 122/292]
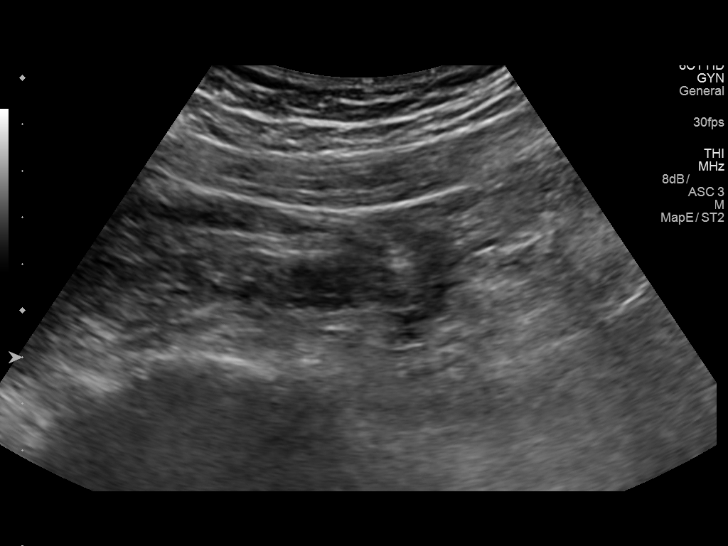
[im 146/292]
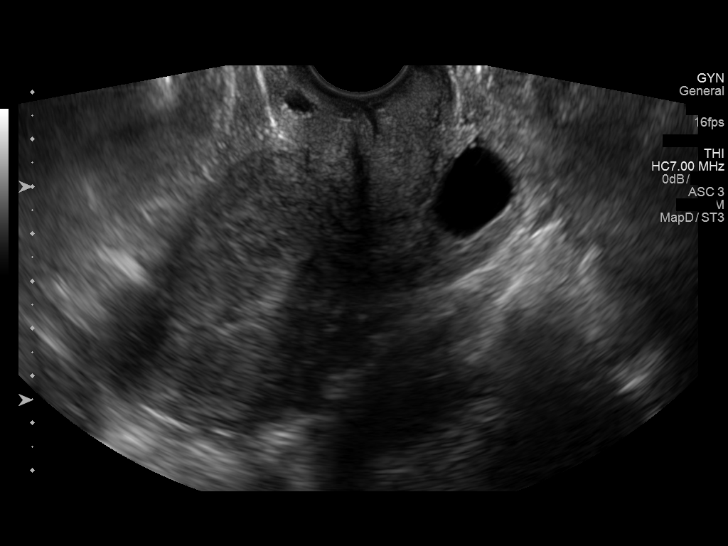
[im 170/292]
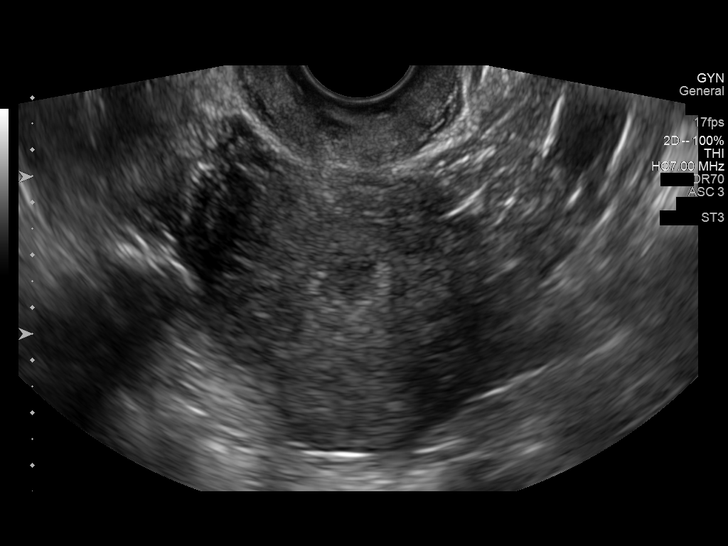
[im 195/292]
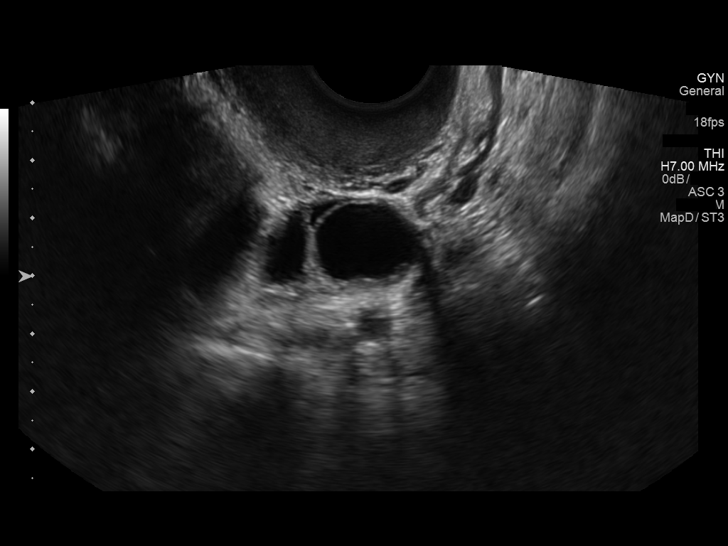
[im 219/292]
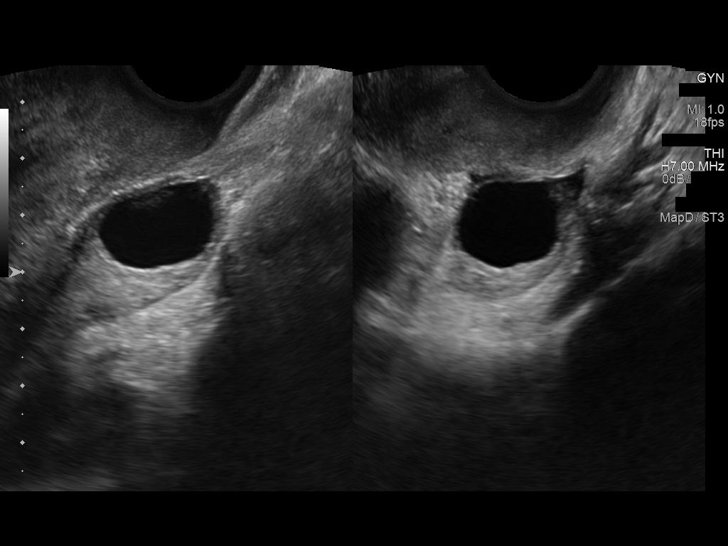
[im 243/292]
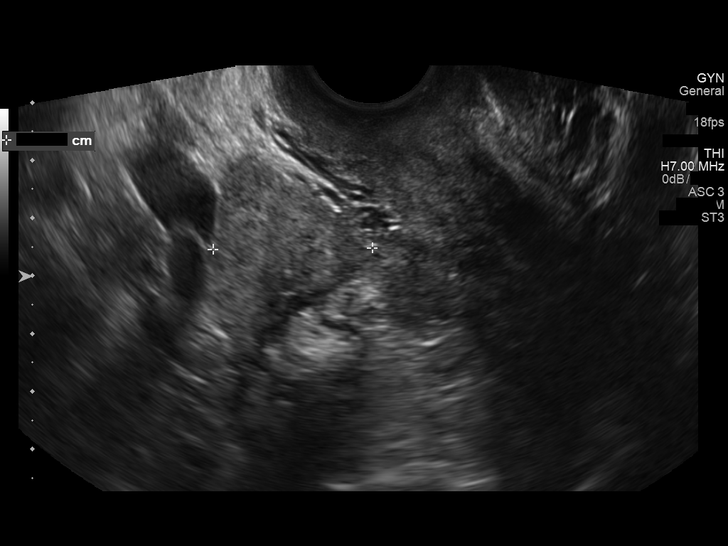
[im 267/292]
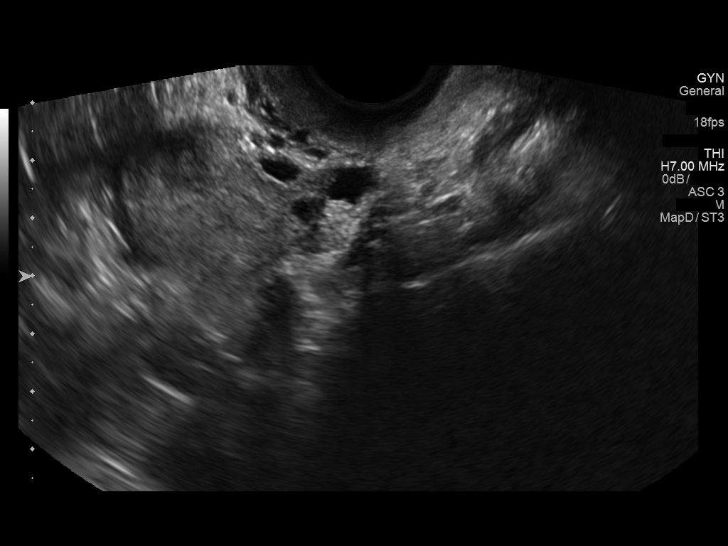
[im 292/292]
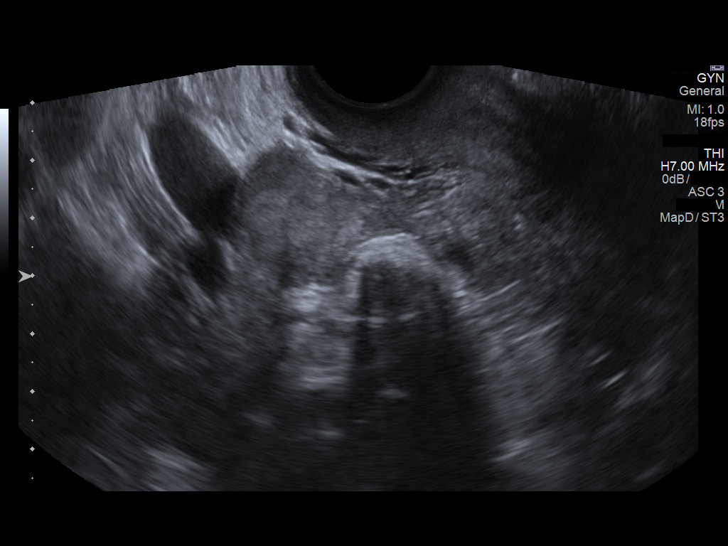

[13 of 25 positions shown; findings below may reference images not displayed]

FINDINGS: Uterus

Measurements: 9.7 x 4.7 x 5.8 cm. No fibroids or other mass
visualized.

Endometrium

Thickness: 14 mm.  No focal abnormality visualized.

Right ovary

Measurements: 4.1 x 2.7 x 2.8 cm. Normal appearance/no adnexal mass.

Left ovary

Measurements: 4.5 x 2.1 x 3.7 cm. Two ovarian follicles measuring up
to 2.2 cm, physiologic.

Pulsed Doppler evaluation of both ovaries demonstrates normal
low-resistance arterial and venous waveforms.

Other findings

Trace pelvic fluid.
IMPRESSION: Negative pelvic ultrasound.

No evidence of ovarian torsion.

## 2019-03-20 ENCOUNTER — Ambulatory Visit: Payer: 59 | Admitting: Family Medicine

## 2019-04-04 ENCOUNTER — Other Ambulatory Visit: Payer: Self-pay

## 2019-04-04 ENCOUNTER — Ambulatory Visit (INDEPENDENT_AMBULATORY_CARE_PROVIDER_SITE_OTHER): Payer: 59 | Admitting: Registered Nurse

## 2019-04-04 DIAGNOSIS — Z23 Encounter for immunization: Secondary | ICD-10-CM | POA: Diagnosis not present

## 2019-04-04 NOTE — Patient Instructions (Signed)
Injection given in left arm, pt tolerated well.  

## 2019-08-07 ENCOUNTER — Ambulatory Visit: Payer: 59 | Admitting: Family Medicine

## 2019-08-07 ENCOUNTER — Encounter: Payer: Self-pay | Admitting: Family Medicine

## 2019-08-07 ENCOUNTER — Other Ambulatory Visit: Payer: Self-pay

## 2019-08-07 VITALS — BP 140/88 | HR 79 | Temp 97.9°F | Ht 66.0 in | Wt 234.4 lb

## 2019-08-07 DIAGNOSIS — D508 Other iron deficiency anemias: Secondary | ICD-10-CM

## 2019-08-07 DIAGNOSIS — E785 Hyperlipidemia, unspecified: Secondary | ICD-10-CM | POA: Diagnosis not present

## 2019-08-07 DIAGNOSIS — I10 Essential (primary) hypertension: Secondary | ICD-10-CM | POA: Diagnosis not present

## 2019-08-07 DIAGNOSIS — R1900 Intra-abdominal and pelvic swelling, mass and lump, unspecified site: Secondary | ICD-10-CM

## 2019-08-07 DIAGNOSIS — Z1211 Encounter for screening for malignant neoplasm of colon: Secondary | ICD-10-CM

## 2019-08-07 MED ORDER — LOSARTAN POTASSIUM 100 MG PO TABS: 100.0000 mg | ORAL_TABLET | Freq: Every day | ORAL | 3 refills | Status: DC

## 2019-08-07 NOTE — Patient Instructions (Addendum)
Fasting labs tomorrow They will call you from [place] to schedule your ultrasound    If you have lab work done today you will be contacted with your lab results within the next 2 weeks.  If you have not heard from [place] then please contact us. The fastest way to get your results is to register for My Chart.   IF you received an x-ray today, you will receive an invoice from [facility]. Please contact [facility] at [phone] with questions or concerns regarding your invoice.   IF you received labwork today, you will receive an invoice from [facility]. Please contact [facility] at [phone] with questions or concerns regarding your invoice.   Our billing staff will not be able to assist you with questions regarding bills from these companies.  You will be contacted with the lab results as soon as they are available. The fastest way to get your results is to activate your My Chart account. Instructions are located on the last page of this paperwork. If you have not heard from [place] regarding the results in 2 weeks, please contact this office.

## 2019-08-07 NOTE — Progress Notes (Signed)
2/4/20213:24 PM  [name] [name] [birth year], 49 y.o., female [id number]  Chief Complaint  Patient presents with  . Hypertension    bp has been high, yesterday says numbers were in 190's. She is not taking the norvasc due to feeling she gets    HPI:   Patient is a 49 y.o. female with past medical history significant for HTN and iron def anemia who presents today for routine followup  Last OV June 2020 with Orland Mustard, NP Patient reports that her anemia is from menorrhagia and diet She received IV iron in July 2020 from obgyn Does not take PO iron  Usually her BP is well controlled until last night when it was ~ 190.  She had been doing fish soup and black coffee for 3 days  Right knee and right hip Having pain for 2 months Started in her hip She has noticed a large mass right lower back that has slowly been growing  EGD in 2015 - h pylori infection Pelvic US 2018 - no fibroids Normal hemoglobinopathy eval  Depression screen [facility] 2/9 08/07/2019 12/26/2018 06/10/2018  Decreased Interest 0 0 0  Down, Depressed, Hopeless 0 0 0  PHQ - 2 Score 0 0 0    Fall Risk  08/07/2019 12/26/2018 06/10/2018 12/13/2017 09/18/2017  Falls in the past year? 0 0 0 No No  Number falls in past yr: 0 0 - - -  Injury with Fall? 0 0 - - -  Follow up - Falls evaluation completed - - -     Allergies  Allergen Reactions  . Hctz [Hydrochlorothiazide]     Heart palpitations    Prior to Admission medications   Medication Sig Start Date End Date Taking? Authorizing Provider  losartan (COZAAR) 100 MG tablet Take 1 tablet (100 mg total) by mouth daily. 12/26/18  Yes Maximiano Coss, NP    Past Medical History:  Diagnosis Date  . Anemia   . Fatty liver   . H. pylori infection   . HTN (hypertension)   . Hx of cardiovascular stress test    a. ETT-Myoview 05/28/12: Low risk study with a small, mild fixed basal inferior perfusion defect most likely representing soft tissue attenuation with inferolateral HK  (wall motion abnormality does not correlate with inferior perfusion defect), EF 49%.  Marland Kitchen Hx of echocardiogram    a. Echocardiogram 06/11/12: EF 50-55%, normal wall motion, grade 1 diastolic dysfunction, trivial AI, mild MR, mild LAE, mild RAE.  . RUQ pain     Past Surgical History:  Procedure Laterality Date  . NO PAST SURGERIES      Social History   Tobacco Use  . Smoking status: Never Smoker  . Smokeless tobacco: Never Used  Substance Use Topics  . Alcohol use: No    Family History  Problem Relation Age of Onset  . Coronary artery disease Mother   . Other Brother        fluid around heart  . Other Other        hepatic issues-? maternal side  . Stomach cancer Neg Hx   . Colon cancer Neg Hx     Review of Systems  Constitutional: Negative for chills and fever.  Respiratory: Negative for cough and shortness of breath.   Cardiovascular: Negative for chest pain, palpitations and leg swelling.  Gastrointestinal: Negative for abdominal pain, nausea and vomiting.     OBJECTIVE:  Today's Vitals   08/07/19 1517  BP: 140/88  Pulse: 79  Temp: 97.9 F (36.6  C)  SpO2: 100%  Weight: 234 lb 6.4 oz (106.3 kg)  Height: 5' 6"  (1.676 m)   Body mass index is 37.83 kg/m.   Physical Exam Vitals and nursing note reviewed.  Constitutional:      Appearance: She is well-developed.  HENT:     Head: Normocephalic and atraumatic.     Mouth/Throat:     Pharynx: No oropharyngeal exudate.  Eyes:     General: No scleral icterus.    Conjunctiva/sclera: Conjunctivae normal.     Pupils: Pupils are equal, round, and reactive to light.  Cardiovascular:     Rate and Rhythm: Normal rate and regular rhythm.     Heart sounds: Normal heart sounds. No murmur. No friction rub. No gallop.   Pulmonary:     Effort: Pulmonary effort is normal.     Breath sounds: Normal breath sounds. No wheezing or rales.  Musculoskeletal:     Cervical back: Neck supple.  Skin:    General: Skin is warm and  dry.       Neurological:     Mental Status: She is alert and oriented to person, place, and time.     No results found for this or any previous visit (from the past 24 hour(s)).  No results found.   ASSESSMENT and PLAN  1. Essential hypertension Controlled. Continue current regime.  - CMP14+EGFR; Future - TSH; Future - losartan (COZAAR) 100 MG tablet; Take 1 tablet (100 mg total) by mouth daily.  2. Hyperlipidemia, unspecified hyperlipidemia type Checking labs today, medications will be adjusted as needed.  - Lipid panel; Future  3. Iron deficiency anemia secondary to inadequate dietary iron intake Labs pending. Will start PO iron if needed - CBC; Future - Iron, TIBC and Ferritin Panel; Future  4. Colon cancer screening - Ambulatory referral to Gastroenterology  5. Right flank mass Favor lipoma, next steps pending [place] results - [place] [facility]; Future   Return in about 6 months (around 02/04/2020).    Rutherford Guys, MD [facility] at [place], [place] [address] Ph.  [phone] Fax [phone]

## 2019-08-08 ENCOUNTER — Ambulatory Visit (INDEPENDENT_AMBULATORY_CARE_PROVIDER_SITE_OTHER): Payer: 59 | Admitting: Registered Nurse

## 2019-08-08 DIAGNOSIS — I10 Essential (primary) hypertension: Secondary | ICD-10-CM

## 2019-08-08 DIAGNOSIS — E785 Hyperlipidemia, unspecified: Secondary | ICD-10-CM

## 2019-08-08 DIAGNOSIS — D508 Other iron deficiency anemias: Secondary | ICD-10-CM

## 2019-08-09 LAB — CMP14+EGFR
ALT: 16 IU/L (ref 0–32)
AST: 18 IU/L (ref 0–40)
Albumin/Globulin Ratio: 1.3 (ref 1.2–2.2)
Albumin: 4.3 g/dL (ref 3.8–4.8)
Alkaline Phosphatase: 85 IU/L (ref 39–117)
BUN/Creatinine Ratio: 15 (ref 9–23)
BUN: 10 mg/dL (ref 6–24)
Bilirubin Total: 0.3 mg/dL (ref 0.0–1.2)
CO2: 22 mmol/L (ref 20–29)
Calcium: 9.3 mg/dL (ref 8.7–10.2)
Chloride: 104 mmol/L (ref 96–106)
Creatinine, Ser: 0.66 mg/dL (ref 0.57–1.00)
GFR calc Af Amer: 119 mL/min/{1.73_m2} (ref >59)
GFR calc non Af Amer: 103 mL/min/{1.73_m2} (ref >59)
Globulin, Total: 3.2 g/dL (ref 1.5–4.5)
Glucose: 80 mg/dL (ref 65–99)
Potassium: 4.3 mmol/L (ref 3.5–5.2)
Sodium: 138 mmol/L (ref 134–144)
Total Protein: 7.5 g/dL (ref 6.0–8.5)

## 2019-08-09 LAB — CBC
Hematocrit: 37.6 % (ref 34.0–46.6)
Hemoglobin: 12 g/dL (ref 11.1–15.9)
MCH: 27.5 pg (ref 26.6–33.0)
MCHC: 31.9 g/dL (ref 31.5–35.7)
MCV: 86 fL (ref 79–97)
Platelets: 205 10*3/uL (ref 150–450)
RBC: 4.36 x10E6/uL (ref 3.77–5.28)
RDW: 13.2 % (ref 11.7–15.4)
WBC: 3.7 10*3/uL (ref 3.4–10.8)

## 2019-08-09 LAB — LIPID PANEL
Chol/HDL Ratio: 2 ratio (ref 0.0–4.4)
Cholesterol, Total: 199 mg/dL (ref 100–199)
HDL: 102 mg/dL (ref >39)
LDL Chol Calc (NIH): 90 mg/dL (ref 0–99)
Triglycerides: 37 mg/dL (ref 0–149)
VLDL Cholesterol Cal: 7 mg/dL (ref 5–40)

## 2019-08-09 LAB — IRON,TIBC AND FERRITIN PANEL
Ferritin: 12 ng/mL — ABNORMAL LOW (ref 15–150)
Iron Saturation: 12 % — ABNORMAL LOW (ref 15–55)
Iron: 46 ug/dL (ref 27–159)
Total Iron Binding Capacity: 398 ug/dL (ref 250–450)
UIBC: 352 ug/dL (ref 131–425)

## 2019-08-09 LAB — TSH: TSH: 1.8 u[IU]/mL (ref 0.450–4.500)

## 2019-08-18 ENCOUNTER — Ambulatory Visit
Admission: RE | Admit: 2019-08-18 | Discharge: 2019-08-18 | Disposition: A | Discharge status: Home / Self Care | Payer: 59 | Source: Ambulatory Visit | Attending: Family Medicine | Admitting: Family Medicine

## 2019-08-18 DIAGNOSIS — R1900 Intra-abdominal and pelvic swelling, mass and lump, unspecified site: Secondary | ICD-10-CM

## 2019-08-22 ENCOUNTER — Other Ambulatory Visit: Payer: Self-pay | Admitting: Registered Nurse

## 2019-08-22 DIAGNOSIS — D179 Benign lipomatous neoplasm, unspecified: Secondary | ICD-10-CM

## 2019-08-22 NOTE — Progress Notes (Signed)
Called patient to discuss ultrasound results - spoke w husband, ok per pt signoff. Discussed that a mass was noted, likely benign, but MRI warranted to follow up. He demonstrated understanding and will call back with patient should she have any further questions, comments, or concerns. I will place MRI order this afternoon.  Thank you,  Kathrin Ruddy, NP

## 2019-08-29 ENCOUNTER — Telehealth: Payer: Self-pay | Admitting: Registered Nurse

## 2019-08-29 NOTE — Telephone Encounter (Signed)
For pts Mri - case is in review   Case number [id number] Peer to peer is required per insurance  Phone for p2p is [phone] option 3  [id number] -[id number]  Thank you

## 2019-08-29 NOTE — Telephone Encounter (Signed)
P2p completed  auth not: [id number] Valid: feb 26 - Oct 13, 2019   Thank you!  Kathrin Ruddy, NP

## 2019-09-08 ENCOUNTER — Encounter: Payer: Self-pay | Admitting: Internal Medicine

## 2019-09-10 ENCOUNTER — Other Ambulatory Visit: Payer: Self-pay | Admitting: Registered Nurse

## 2019-09-10 DIAGNOSIS — R1900 Intra-abdominal and pelvic swelling, mass and lump, unspecified site: Secondary | ICD-10-CM

## 2019-09-15 ENCOUNTER — Telehealth: Payer: Self-pay | Admitting: Registered Nurse

## 2019-09-15 NOTE — Telephone Encounter (Signed)
Good afternoon,  The mri needs peer to peer Please call [phone] opt 3 Case number [id number] Id number [id number] Thank you!

## 2019-09-23 NOTE — Telephone Encounter (Signed)
P2P done  -  [phone] Exp: 11/07/19  Let me know if there's anything else I can do!  Thank you,  Kathrin Ruddy, NP

## 2019-10-08 ENCOUNTER — Other Ambulatory Visit: Payer: Self-pay

## 2019-10-08 ENCOUNTER — Ambulatory Visit
Admission: RE | Admit: 2019-10-08 | Discharge: 2019-10-08 | Disposition: A | Discharge status: Home / Self Care | Payer: 59 | Source: Ambulatory Visit | Attending: Registered Nurse | Admitting: Registered Nurse

## 2019-10-08 DIAGNOSIS — R1900 Intra-abdominal and pelvic swelling, mass and lump, unspecified site: Secondary | ICD-10-CM

## 2019-10-08 MED ORDER — GADOBENATE DIMEGLUMINE 529 MG/ML IV SOLN
20.0000 mL | Freq: Once | INTRAVENOUS | Status: AC | PRN
  Administered 2019-10-08: 20 mL via INTRAVENOUS

## 2019-10-10 ENCOUNTER — Other Ambulatory Visit: Payer: Self-pay

## 2019-10-10 ENCOUNTER — Ambulatory Visit (AMBULATORY_SURGERY_CENTER): Payer: Self-pay | Admitting: *Deleted

## 2019-10-10 VITALS — Temp 97.1°F | Ht 66.0 in | Wt 236.0 lb

## 2019-10-10 DIAGNOSIS — Z01818 Encounter for other preprocedural examination: Secondary | ICD-10-CM

## 2019-10-10 DIAGNOSIS — Z1211 Encounter for screening for malignant neoplasm of colon: Secondary | ICD-10-CM

## 2019-10-10 MED ORDER — SUPREP BOWEL PREP KIT 17.5-3.13-1.6 GM/177ML PO SOLN: ORAL | 0 refills | Status: DC

## 2019-10-10 NOTE — Progress Notes (Signed)
covid test 10-21-19 at 12:30 pm  Pt is aware that care partner will wait in the car during procedure; if they feel like they will be too hot or cold to wait in the car; they may wait in the 4 th floor lobby. Patient is aware to bring only one care partner. We want them to wear a mask (we do not have any that we can provide them), practice social distancing, and we will check their temperatures when they get here.  I did remind the patient that their care partner needs to stay in the parking lot the entire time and have a cell phone available, we will call them when the pt is ready for discharge. Patient will wear mask into building.   No egg or soy allergy  No home oxygen use   No medications for weight loss taken  emmi information given  Pt denies constipation issues  No trouble moving neck per pt  suprep coupon given and code put into RX

## 2019-10-21 ENCOUNTER — Ambulatory Visit (INDEPENDENT_AMBULATORY_CARE_PROVIDER_SITE_OTHER): Payer: 59

## 2019-10-21 ENCOUNTER — Other Ambulatory Visit: Payer: Self-pay | Admitting: Internal Medicine

## 2019-10-21 DIAGNOSIS — Z1159 Encounter for screening for other viral diseases: Secondary | ICD-10-CM

## 2019-10-21 LAB — SARS CORONAVIRUS 2 (TAT 6-24 HRS): SARS Coronavirus 2: NEGATIVE

## 2019-10-22 ENCOUNTER — Encounter: Payer: Self-pay | Admitting: Internal Medicine

## 2019-10-24 ENCOUNTER — Other Ambulatory Visit: Payer: Self-pay

## 2019-10-24 ENCOUNTER — Encounter: Payer: Self-pay | Admitting: Internal Medicine

## 2019-10-24 ENCOUNTER — Ambulatory Visit (AMBULATORY_SURGERY_CENTER): Payer: 59 | Admitting: Internal Medicine

## 2019-10-24 VITALS — BP 136/82 | HR 70 | Temp 97.3°F | Resp 18 | Ht 66.0 in | Wt 236.0 lb

## 2019-10-24 DIAGNOSIS — Z1211 Encounter for screening for malignant neoplasm of colon: Secondary | ICD-10-CM | POA: Diagnosis not present

## 2019-10-24 MED ORDER — SODIUM CHLORIDE 0.9 % IV SOLN: 500.0000 mL | Freq: Once | INTRAVENOUS | Status: DC

## 2019-10-24 NOTE — Progress Notes (Signed)
VS-[name] [name] Temp-LS Pt's states no medical or surgical changes since previsit or office visit.

## 2019-10-24 NOTE — Op Note (Signed)
[place] Patient Name: [name] [name] Procedure Date: 10/24/2019 11:22 AM MRN: [id number] Endoscopist: Jerene Bears , MD Age: 49 Referring MD:  Date of Birth: 13-Aug-1970 Gender: Female Account #: [id number] Procedure:                Colonoscopy Indications:              Screening for colorectal malignant neoplasm, This                            is the patient's first colonoscopy Medicines:                Monitored Anesthesia Care Procedure:                Pre-Anesthesia Assessment:                           - Prior to the procedure, a History and Physical                            was performed, and patient medications and                            allergies were reviewed. The patient's tolerance of                            previous anesthesia was also reviewed. The risks                            and benefits of the procedure and the sedation                            options and risks were discussed with the patient.                            All questions were answered, and informed consent                            was obtained. Prior Anticoagulants: The patient has                            taken no previous anticoagulant or antiplatelet                            agents. ASA Grade Assessment: II - A patient with                            mild systemic disease. After reviewing the risks                            and benefits, the patient was deemed in                            satisfactory condition to undergo the procedure.  After obtaining informed consent, the colonoscope                            was passed under direct vision. Throughout the                            procedure, the patient's blood pressure, pulse, and                            oxygen saturations were monitored continuously. The                            Colonoscope was introduced through the anus and                            advanced to the cecum,  identified by appendiceal                            orifice and ileocecal valve. The colonoscopy was                            performed without difficulty. The patient tolerated                            the procedure well. The quality of the bowel                            preparation was excellent. The ileocecal valve,                            appendiceal orifice, and rectum were photographed. Scope In: 11:25:46 AM Scope Out: 11:36:22 AM Scope Withdrawal Time: 0 hours 8 minutes 26 seconds  Total Procedure Duration: 0 hours 10 minutes 36 seconds  Findings:                 The digital rectal exam was normal.                           The colon (entire examined portion) appeared normal.                           Internal hemorrhoids were found during                            retroflexion. The hemorrhoids were small. Complications:            No immediate complications. Estimated Blood Loss:     Estimated blood loss: none. Impression:               - The entire examined colon is normal.                           - Small internal hemorrhoids.                           - No specimens collected. Recommendation:           -  Patient has a contact number available for                            emergencies. The signs and symptoms of potential                            delayed complications were discussed with the                            patient. Return to normal activities tomorrow.                            Written discharge instructions were provided to the                            patient.                           - Resume previous diet.                           - Continue present medications.                           - Repeat colonoscopy in 10 years for screening                            purposes. Jerene Bears, MD 10/24/2019 11:38:35 AM This report has been signed electronically.

## 2019-10-24 NOTE — Patient Instructions (Signed)
YOU HAD AN ENDOSCOPIC PROCEDURE TODAY AT THE [facility]:   Refer to the procedure report that was given to you for any specific questions about what was found during the examination.  If the procedure report does not answer your questions, please call your gastroenterologist to clarify.  If you requested that your care partner not be given the details of your procedure findings, then the procedure report has been included in a sealed envelope for you to review at your convenience later.  YOU SHOULD EXPECT: Some feelings of bloating in the abdomen. Passage of more gas than usual.  Walking can help get rid of the air that was put into your GI tract during the procedure and reduce the bloating. If you had a lower endoscopy (such as a colonoscopy or flexible sigmoidoscopy) you may notice spotting of blood in your stool or on the toilet paper. If you underwent a bowel prep for your procedure, you may not have a normal bowel movement for a few days.  Please Note:  You might notice some irritation and congestion in your nose or some drainage.  This is from the oxygen used during your procedure.  There is no need for concern and it should clear up in a day or so.  SYMPTOMS TO REPORT IMMEDIATELY:   Following lower endoscopy (colonoscopy or flexible sigmoidoscopy):  Excessive amounts of blood in the stool  Significant tenderness or worsening of abdominal pains  Swelling of the abdomen that is new, acute  Fever of 100F or higher  For urgent or emergent issues, a gastroenterologist can be reached at any hour by calling [phone]. Do not use MyChart messaging for urgent concerns.    DIET:  We do recommend a small meal at first, but then you may proceed to your regular diet.  Drink plenty of fluids but you should avoid alcoholic beverages for 24 hours.  ACTIVITY:  You should plan to take it easy for the rest of today and you should NOT DRIVE or use heavy machinery until tomorrow (because  of the sedation medicines used during the test).    FOLLOW UP: Our staff will call the number listed on your records 48-72 hours following your procedure to check on you and address any questions or concerns that you may have regarding the information given to you following your procedure. If we do not reach you, we will leave a message.  We will attempt to reach you two times.  During this call, we will ask if you have developed any symptoms of COVID 19. If you develop any symptoms (ie: fever, flu-like symptoms, shortness of breath, cough etc.) before then, please call [phone].  If you test positive for Covid 19 in the 2 weeks post procedure, please call and report this information to us.    If any biopsies were taken you will be contacted by phone or by letter within the next 1-3 weeks.  Please call us at [phone] if you have not heard about the biopsies in 3 weeks.    SIGNATURES/CONFIDENTIALITY: You and/or your care partner have signed paperwork which will be entered into your electronic medical record.  These signatures attest to the fact that that the information above on your After Visit Summary has been reviewed and is understood.  Full responsibility of the confidentiality of this discharge information lies with you and/or your care-partner. 

## 2019-10-24 NOTE — Progress Notes (Signed)
To PACU, VSS. Report to [email or web address] 

## 2019-10-28 ENCOUNTER — Telehealth: Payer: Self-pay

## 2019-10-28 NOTE — Telephone Encounter (Signed)
  Follow up Call-  Call back number 10/24/2019  Post procedure Call Back phone  # [phone]  Permission to leave phone message Yes  Some recent data might be hidden     Patient questions:  Do you have a fever, pain , or abdominal swelling? No. Pain Score  0 *  Have you tolerated food without any problems? Yes.    Have you been able to return to your normal activities? Yes.    Do you have any questions about your discharge instructions: Diet   No. Medications  No. Follow up visit  No.  Do you have questions or concerns about your Care? No.  Actions: * If pain score is 4 or above: No action needed, pain <4. Have you developed a fever since your procedure? *no 2.   Have you had an respiratory symptoms (SOB or cough) since your procedure? no  3.   Have you tested positive for COVID 19 since your procedure no  4.   Have you had any family members/close contacts diagnosed with the COVID 19 since your procedure?  no   If yes to any of these questions please route to Joylene John, RN and Erenest Rasher, RN

## 2019-12-10 ENCOUNTER — Other Ambulatory Visit: Payer: Self-pay

## 2019-12-10 ENCOUNTER — Ambulatory Visit: Payer: 59 | Admitting: Registered Nurse

## 2019-12-10 VITALS — BP 150/81 | HR 76 | Temp 98.0°F | Ht 65.0 in | Wt 231.0 lb

## 2019-12-10 DIAGNOSIS — H8112 Benign paroxysmal vertigo, left ear: Secondary | ICD-10-CM

## 2019-12-10 DIAGNOSIS — Z130 Encounter for screening for diseases of the blood and blood-forming organs and certain disorders involving the immune mechanism: Secondary | ICD-10-CM

## 2019-12-10 DIAGNOSIS — R0789 Other chest pain: Secondary | ICD-10-CM

## 2019-12-10 DIAGNOSIS — Z1329 Encounter for screening for other suspected endocrine disorder: Secondary | ICD-10-CM

## 2019-12-10 DIAGNOSIS — Z1159 Encounter for screening for other viral diseases: Secondary | ICD-10-CM | POA: Diagnosis not present

## 2019-12-10 DIAGNOSIS — Z13228 Encounter for screening for other metabolic disorders: Secondary | ICD-10-CM

## 2019-12-10 DIAGNOSIS — J302 Other seasonal allergic rhinitis: Secondary | ICD-10-CM

## 2019-12-10 DIAGNOSIS — I10 Essential (primary) hypertension: Secondary | ICD-10-CM

## 2019-12-10 DIAGNOSIS — Z1231 Encounter for screening mammogram for malignant neoplasm of breast: Secondary | ICD-10-CM

## 2019-12-10 DIAGNOSIS — Z862 Personal history of diseases of the blood and blood-forming organs and certain disorders involving the immune mechanism: Secondary | ICD-10-CM

## 2019-12-10 DIAGNOSIS — Z13220 Encounter for screening for lipoid disorders: Secondary | ICD-10-CM

## 2019-12-10 DIAGNOSIS — G43009 Migraine without aura, not intractable, without status migrainosus: Secondary | ICD-10-CM

## 2019-12-10 MED ORDER — SUMATRIPTAN SUCCINATE 50 MG PO TABS: 50.0000 mg | ORAL_TABLET | ORAL | 0 refills | Status: DC | PRN

## 2019-12-10 MED ORDER — MECLIZINE HCL 12.5 MG PO TABS: 12.5000 mg | ORAL_TABLET | Freq: Three times a day (TID) | ORAL | 0 refills | Status: DC | PRN

## 2019-12-10 MED ORDER — AMLODIPINE BESYLATE 5 MG PO TABS: 5.0000 mg | ORAL_TABLET | Freq: Every day | ORAL | 3 refills | Status: DC

## 2019-12-10 MED ORDER — CETIRIZINE HCL 10 MG PO TABS: 10.0000 mg | ORAL_TABLET | Freq: Every day | ORAL | 11 refills | Status: DC

## 2019-12-10 MED ORDER — FLUTICASONE PROPIONATE 50 MCG/ACT NA SUSP: 2.0000 | Freq: Every day | NASAL | 6 refills | Status: DC

## 2019-12-10 NOTE — Progress Notes (Signed)
Acute Office Visit  Subjective:    Patient ID: [name] [name], female    DOB: 1971-01-14, 49 y.o.   MRN: [id number]  Chief Complaint  Patient presents with   Migraine    Pt stated that she has been having some headaches along with neck pain and some dizzness for the pasr 2 weeks.    HPI Patient is in today for headache  Onset 2 weeks ago. Worsening. Mostly L side. Pain in sphenoid and frontal sinuses, headache is left sided down towards neck. No change to ROM in neck. No acute injury. No visual changes. No rash. Some discomfort in ear but no changes to hearing. Does note some symptoms consistent with Vertigo - states that when she lies down or stands up, feels like the room is spinning around her. Has not fallen or lost consciousness  Has a hx of htn. Takes bp at home, notes that some readings as high as 170/100. Reports good compliance with losartan 110m PO qd. However, does note some chest tightness that has come along with this   Past Medical History:  Diagnosis Date   Anemia    Fatty liver    H. pylori infection    HTN (hypertension)    Hx of cardiovascular stress test    a. ETT-Myoview 05/28/12: Low risk study with a small, mild fixed basal inferior perfusion defect most likely representing soft tissue attenuation with inferolateral HK (wall motion abnormality does not correlate with inferior perfusion defect), EF 49%.   Hx of echocardiogram    a. Echocardiogram 06/11/12: EF 50-55%, normal wall motion, grade 1 diastolic dysfunction, trivial AI, mild MR, mild LAE, mild RAE.   RUQ pain     Past Surgical History:  Procedure Laterality Date   NO PAST SURGERIES     UPPER GASTROINTESTINAL ENDOSCOPY      Family History  Problem Relation Age of Onset   Coronary artery disease Mother    Other Brother        fluid around heart   Other Other        hepatic issues-? maternal side   Stomach cancer Neg Hx    Colon cancer Neg Hx    Esophageal cancer Neg Hx      Rectal cancer Neg Hx     Social History   Socioeconomic History   Marital status: Married    Spouse name: Not on file   Number of children: 4   Years of education: Not on file   Highest education level: Not on file  Occupational History   Occupation: Housekeeper    Comment: at hotel    Employer: [employer]  Tobacco Use   Smoking status: Never Smoker   Smokeless tobacco: Never Used  Substance and Sexual Activity   Alcohol use: No   Drug use: No   Sexual activity: Yes    Birth control/protection: None  Other Topics Concern   Not on file  Social History Narrative   Not on file   Social Determinants of Health   Financial Resource Strain: Low Risk    Difficulty of Paying Living Expenses: Not hard at all  Food Insecurity: No Food Insecurity   Worried About RCharity fundraiserin the Last Year: Never true   Ran Out of Food in the Last Year: Never true  Transportation Needs: No Transportation Needs   Lack of Transportation (Medical): No   Lack of Transportation (Non-Medical): No  Physical Activity: Inactive   Days of Exercise per  Week: 0 days   Minutes of Exercise per Session: 0 min  Stress: No Stress Concern Present   Feeling of Stress : Not at all  Social Connections: Slightly Isolated   Frequency of Communication with Friends and Family: Three times a week   Frequency of Social Gatherings with Friends and Family: Twice a week   Attends Religious Services: More than 4 times per year   Active Member of [employer] or Organizations: No   Attends Music therapist: Never   Marital Status: Married  Human resources officer Violence: Not At Risk   Fear of Current or Ex-Partner: No   Emotionally Abused: No   Physically Abused: No   Sexually Abused: No    Outpatient Medications Prior to Visit  Medication Sig Dispense Refill   losartan (COZAAR) 100 MG tablet Take 1 tablet (100 mg total) by mouth daily. 90 tablet 3   No  facility-administered medications prior to visit.    Allergies  Allergen Reactions   Hctz [Hydrochlorothiazide]     Heart palpitations- pt is unsure of this    Review of Systems  Constitutional: Negative.   HENT: Negative.   Eyes: Negative.   Respiratory: Negative.   Cardiovascular: Negative.   Gastrointestinal: Negative.   Endocrine: Negative.   Genitourinary: Negative.   Musculoskeletal: Negative.   Skin: Negative.   Allergic/Immunologic: Negative.   Neurological: Negative.   Hematological: Negative.   Psychiatric/Behavioral: Negative.   All other systems reviewed and are negative.      Objective:    Physical Exam Vitals and nursing note reviewed.  Constitutional:      General: She is not in acute distress.    Appearance: She is obese. She is not ill-appearing, toxic-appearing or diaphoretic.  HENT:     Head: Normocephalic and atraumatic.     Right Ear: Hearing, tympanic membrane, ear canal and external ear normal. There is no impacted cerumen.     Left Ear: Hearing, tympanic membrane and external ear normal. No decreased hearing noted. Drainage, swelling and tenderness present. There is no impacted cerumen.     Ears:     Comments: L canal appears irritated, swollen, erythematic     Nose: Mucosal edema present. No nasal deformity, septal deviation, signs of injury, laceration, nasal tenderness, congestion or rhinorrhea.     Right Nostril: No foreign body, epistaxis, septal hematoma or occlusion.     Left Nostril: No foreign body, epistaxis, septal hematoma or occlusion.     Right Turbinates: Swollen. Not enlarged or pale.     Left Turbinates: Swollen. Not enlarged or pale.     Right Sinus: Frontal sinus tenderness present. No maxillary sinus tenderness.     Left Sinus: Frontal sinus tenderness present. No maxillary sinus tenderness.     Mouth/Throat:     Mouth: Mucous membranes are moist.     Pharynx: Oropharynx is clear. Posterior oropharyngeal erythema present.  No oropharyngeal exudate.     Tonsils: No tonsillar exudate or tonsillar abscesses.  Eyes:     General: No visual field deficit or scleral icterus.       Right eye: No discharge.        Left eye: No discharge.     Extraocular Movements: Extraocular movements intact.     Conjunctiva/sclera: Conjunctivae normal.     Pupils: Pupils are equal, round, and reactive to light.  Skin:    General: Skin is warm and dry.     Capillary Refill: Capillary refill takes less than 2 seconds.  Coloration: Skin is not jaundiced or pale.     Findings: No bruising, erythema, lesion or rash.  Neurological:     General: No focal deficit present.     Mental Status: She is alert and oriented to person, place, and time. Mental status is at baseline.     GCS: GCS eye subscore is 4. GCS verbal subscore is 5. GCS motor subscore is 6.     Cranial Nerves: Cranial nerves are intact. No cranial nerve deficit, dysarthria or facial asymmetry.     Sensory: No sensory deficit.     Motor: Motor function is intact. No weakness.     Coordination: Coordination normal.     Gait: Gait normal.     Deep Tendon Reflexes: Reflexes normal.  Psychiatric:        Mood and Affect: Mood normal.        Behavior: Behavior normal.        Thought Content: Thought content normal.        Judgment: Judgment normal.     BP (!) 150/81 (BP Location: Right Arm, Patient Position: Sitting, Cuff Size: Large)    Pulse 76    Temp 98 F (36.7 C) (Temporal)    Ht 5' 5"  (1.651 m)    Wt 231 lb (104.8 kg)    LMP 11/12/2019    SpO2 98%    BMI 38.44 kg/m  Wt Readings from Last 3 Encounters:  12/10/19 231 lb (104.8 kg)  10/24/19 236 lb (107 kg)  10/10/19 236 lb (107 kg)    Health Maintenance Due  Topic Date Due   Hepatitis C Screening  Never done   COVID-19 Vaccine (1) Never done   MAMMOGRAM  06/01/2019    There are no preventive care reminders to display for this patient.   Lab Results  Component Value Date   TSH 1.800 08/08/2019    Lab Results  Component Value Date   WBC 3.7 08/08/2019   HGB 12.0 08/08/2019   HCT 37.6 08/08/2019   MCV 86 08/08/2019   PLT 205 08/08/2019   Lab Results  Component Value Date   NA 138 08/08/2019   K 4.3 08/08/2019   CO2 22 08/08/2019   GLUCOSE 80 08/08/2019   BUN 10 08/08/2019   CREATININE 0.66 08/08/2019   BILITOT 0.3 08/08/2019   ALKPHOS 85 08/08/2019   AST 18 08/08/2019   ALT 16 08/08/2019   PROT 7.5 08/08/2019   ALBUMIN 4.3 08/08/2019   CALCIUM 9.3 08/08/2019   GFR 100.64 07/02/2012   Lab Results  Component Value Date   CHOL 199 08/08/2019   Lab Results  Component Value Date   HDL 102 08/08/2019   Lab Results  Component Value Date   LDLCALC 90 08/08/2019   Lab Results  Component Value Date   TRIG 37 08/08/2019   Lab Results  Component Value Date   CHOLHDL 2.0 08/08/2019   Lab Results  Component Value Date   HGBA1C 5.0 08/19/2016       Assessment & Plan:   Problem List Items Addressed This Visit    None    Visit Diagnoses    Screening for endocrine, metabolic and immunity disorder    -  Primary   Relevant Orders   CMP14+EGFR   Need for hepatitis C screening test       Relevant Orders   Hepatitis C antibody   History of anemia       Relevant Orders   Iron, TIBC and Ferritin  Panel   Encounter for screening mammogram for malignant neoplasm of breast       Relevant Orders   MM Digital Diagnostic Bilat   Lipid screening       Relevant Orders   Lipid panel   Essential hypertension, benign       Relevant Medications   amLODipine (NORVASC) 5 MG tablet   Chest tightness       Relevant Orders   EKG 12-Lead       Meds ordered this encounter  Medications   amLODipine (NORVASC) 5 MG tablet    Sig: Take 1 tablet (5 mg total) by mouth daily.    Dispense:  90 tablet    Refill:  3    Order Specific Question:   Supervising Provider    Answer:   Carlota Raspberry, JEFFREY R [[id number]]   PLAN  Exam shows probable URI vs seasonal allergies.  Unremarkable from a cardiac and neuro standpoint.  EKG shows rate 68 with sinus rhythm. Nonspecific T abnormality per impression not noted on review. Low voltage likely r/t pt obesity rather than pulmonary disease. Overall not concerning for acute cardiac event.  Will try cetirizine, fluticasone nasal spray, meclizine 12.34m PO tid PRN.  Return in 2 weeks for BP check  Patient encouraged to call clinic with any questions, comments, or concerns.   RMaximiano Coss NP

## 2019-12-10 NOTE — Patient Instructions (Signed)
° ° ° °  If you have lab work done today you will be contacted with your lab results within the next 2 weeks.  If you have not heard from us then please contact us. The fastest way to get your results is to register for My Chart. ° ° °IF you received an x-ray today, you will receive an invoice from [facility]. Please contact [facility] at [phone] with questions or concerns regarding your invoice.  ° °IF you received labwork today, you will receive an invoice from [facility]. Please contact [facility] at [phone] with questions or concerns regarding your invoice.  ° °Our billing staff will not be able to assist you with questions regarding bills from these companies. ° °You will be contacted with the lab results as soon as they are available. The fastest way to get your results is to activate your My Chart account. Instructions are located on the last page of this paperwork. If you have not heard from us regarding the results in 2 weeks, please contact this office. °  ° ° ° °

## 2019-12-11 LAB — LIPID PANEL
Chol/HDL Ratio: 2 ratio (ref 0.0–4.4)
Cholesterol, Total: 189 mg/dL (ref 100–199)
HDL: 95 mg/dL (ref >39)
LDL Chol Calc (NIH): 86 mg/dL (ref 0–99)
Triglycerides: 41 mg/dL (ref 0–149)
VLDL Cholesterol Cal: 8 mg/dL (ref 5–40)

## 2019-12-11 LAB — CMP14+EGFR
ALT: 17 IU/L (ref 0–32)
AST: 19 IU/L (ref 0–40)
Albumin/Globulin Ratio: 1.5 (ref 1.2–2.2)
Albumin: 4.3 g/dL (ref 3.8–4.8)
Alkaline Phosphatase: 75 IU/L (ref 48–121)
BUN/Creatinine Ratio: 9 (ref 9–23)
BUN: 6 mg/dL (ref 6–24)
Bilirubin Total: 0.4 mg/dL (ref 0.0–1.2)
CO2: 22 mmol/L (ref 20–29)
Calcium: 9.2 mg/dL (ref 8.7–10.2)
Chloride: 103 mmol/L (ref 96–106)
Creatinine, Ser: 0.67 mg/dL (ref 0.57–1.00)
GFR calc Af Amer: 119 mL/min/{1.73_m2} (ref >59)
GFR calc non Af Amer: 103 mL/min/{1.73_m2} (ref >59)
Globulin, Total: 2.8 g/dL (ref 1.5–4.5)
Glucose: 80 mg/dL (ref 65–99)
Potassium: 4.2 mmol/L (ref 3.5–5.2)
Sodium: 137 mmol/L (ref 134–144)
Total Protein: 7.1 g/dL (ref 6.0–8.5)

## 2019-12-11 LAB — IRON,TIBC AND FERRITIN PANEL
Ferritin: 10 ng/mL — ABNORMAL LOW (ref 15–150)
Iron Saturation: 6 % — CL (ref 15–55)
Iron: 26 ug/dL — ABNORMAL LOW (ref 27–159)
Total Iron Binding Capacity: 409 ug/dL (ref 250–450)
UIBC: 383 ug/dL (ref 131–425)

## 2019-12-11 LAB — HEPATITIS C ANTIBODY: Hep C Virus Ab: <0.1 s/co ratio (ref 0.0–0.9)

## 2019-12-13 ENCOUNTER — Other Ambulatory Visit: Payer: Self-pay | Admitting: Registered Nurse

## 2019-12-13 DIAGNOSIS — G43009 Migraine without aura, not intractable, without status migrainosus: Secondary | ICD-10-CM

## 2019-12-15 ENCOUNTER — Other Ambulatory Visit: Payer: Self-pay | Admitting: Registered Nurse

## 2019-12-15 DIAGNOSIS — D509 Iron deficiency anemia, unspecified: Secondary | ICD-10-CM

## 2019-12-15 MED ORDER — FERROUS SULFATE 90 (18 FE) MG PO TABS: 1.0000 | ORAL_TABLET | Freq: Three times a day (TID) | ORAL | 1 refills | Status: DC

## 2019-12-15 NOTE — Telephone Encounter (Signed)
Has the patient already finished the initial supply given on 12/13/19?  Thank you,  Kathrin Ruddy, NP

## 2019-12-15 NOTE — Progress Notes (Signed)
Pt showing low Fe - likely contributing to headaches and symptoms. Will send iron supplement, take three times daily with orange juice / other acidic food. May turn stool dark color. Return in 4 weeks for repeat labs.  Thank you,  Kathrin Ruddy, NP

## 2019-12-15 NOTE — Telephone Encounter (Signed)
Spoke with patient ,she states has completed previous sumatriptan Rx and also given her lab results. She says migraines are better, but still lingering.

## 2019-12-15 NOTE — Telephone Encounter (Signed)
Thank you - unfortunately seems like we had a miscommunication about the sumatriptan. Will not refill at this time but I think there will be significant relief with Fe supplement.  Thank you  Kathrin Ruddy, NP

## 2019-12-15 NOTE — Progress Notes (Signed)
Pt showing low iron studies. Start on iron supplement tid Return in 4 weeks for labs  Kathrin Ruddy, NP

## 2020-01-01 ENCOUNTER — Ambulatory Visit (INDEPENDENT_AMBULATORY_CARE_PROVIDER_SITE_OTHER): Payer: 59

## 2020-01-01 ENCOUNTER — Encounter: Payer: Self-pay | Admitting: Family Medicine

## 2020-01-01 ENCOUNTER — Other Ambulatory Visit: Payer: Self-pay

## 2020-01-01 ENCOUNTER — Ambulatory Visit: Payer: 59 | Admitting: Family Medicine

## 2020-01-01 VITALS — BP 142/85 | HR 69 | Temp 98.3°F | Ht 65.0 in | Wt 233.0 lb

## 2020-01-01 DIAGNOSIS — M25561 Pain in right knee: Secondary | ICD-10-CM | POA: Diagnosis not present

## 2020-01-01 DIAGNOSIS — M25461 Effusion, right knee: Secondary | ICD-10-CM

## 2020-01-01 MED ORDER — DICLOFENAC SODIUM 75 MG PO TBEC: 75.0000 mg | DELAYED_RELEASE_TABLET | Freq: Two times a day (BID) | ORAL | 0 refills | Status: DC

## 2020-01-01 MED ORDER — PREDNISONE 20 MG PO TABS: ORAL_TABLET | ORAL | 0 refills | Status: DC

## 2020-01-01 NOTE — Progress Notes (Signed)
Patient ID: Sharrie Rothman, female    DOB: [birth year]  Age: 49 y.o. MRN: [id number]  Chief Complaint  Patient presents with  . 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  This has her getting down on her knees a lot.  The last few days she has been having more swelling and pain in her right knee.  The knee gives her trouble and has crepitance anyhow.  This is the first time it really bothers her badly like this.  Current allergies, medications, problem list, past/family and social histories reviewed.  Objective:  BP (!) 142/85   Pulse 69   Temp 98.3 F (36.8 C)   Ht 5\' 5"  (1.651 m)   Wt 233 lb (105.7 kg)   LMP 12/13/2019   SpO2 100%   BMI 38.77 kg/m   Right knee visibly more swollen than the left.  Small effusion palpable.  She is very tender, especially on the medial aspect of the knee joint line.  Assessment & Plan:   Assessment: 1. Acute pain of right knee   2. Effusion of right knee       Plan: X-ray knee.  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  Orders Placed This Encounter  Procedures  . DG Knee Complete 4 Views Right    Standing Status:   Future    Number of Occurrences:   1    Standing Expiration Date:   12/31/2020    Order Specific Question:   Reason for Exam (SYMPTOM  OR DIAGNOSIS REQUIRED)    Answer:   kne pain and swelling    Order Specific Question:   Is patient pregnant?    Answer:   No    Order Specific Question:   Preferred imaging location?    Answer:   External    Order Specific Question:   Radiology Contrast Protocol - do NOT remove file path    Answer:   [email or web address]    Meds ordered this encounter  Medications  . predniSONE (DELTASONE) 20 MG tablet    Sig: Take 3 daily for 2 days, then 2 daily for 2 days, then 1 daily for 2 days.  Take with food, best taken in the  morning.    Dispense:  12 tablet    Refill:  0  . diclofenac (VOLTAREN) 75 MG EC tablet    Sig: Take 1 tablet (75 mg total) by mouth 2 (two) times daily.    Dispense:  30 tablet    Refill:  0         Patient Instructions     You have an acute arthritis of your knee causing swelling and fluid in the knee.  I am prescribing medications to try and calm down that inflammation.  This probably comes from the repeated trauma of kneeling and squatting and lifting.  We will give you a few days off work to allow it to calm down.  If you continue to have problems please return next week because you might need to have an injection in the knee.  Take prednisone 20 mg 3 pills daily for 2 days, then 2 daily for 2 days, then 1 daily for 2 days  Take diclofenac 75 mg 1 twice daily for pain and inflammation  In addition to this you can take Tylenol (acetaminophen) 500  mg 2 pills 3 times daily as needed for pain.  Apply an ice pack to the knee about 4 times daily.  Return as needed.  If you have lab work done today you will be contacted with your lab results within the next 2 weeks.  If you have not heard from [place] then please contact us. The fastest way to get your results is to register for My Chart.   IF you received an x-ray today, you will receive an invoice from [facility] Radiology. Please contact [facility] Radiology at [phone] with questions or concerns regarding your invoice.   IF you received labwork today, you will receive an invoice from [place]. Please contact [facility] at [phone] with questions or concerns regarding your invoice.   Our billing staff will not be able to assist you with questions regarding bills from these companies.  You will be contacted with the lab results as soon as they are available. The fastest way to get your results is to activate your My Chart account. Instructions are located on the last page of this paperwork. If you have not heard from [place]  regarding the results in 2 weeks, please contact this office.         Return if symptoms worsen or fail to improve.   Ruben Reason, MD 01/01/2020

## 2020-01-01 NOTE — Patient Instructions (Addendum)
   You have an acute arthritis of your knee causing swelling and fluid in the knee.  I am prescribing medications to try and calm down that inflammation.  This probably comes from the repeated trauma of kneeling and squatting and lifting.  We will give you a few days off work to allow it to calm down.  If you continue to have problems please return next week because you might need to have an injection in the knee.  Take prednisone 20 mg 3 pills daily for 2 days, then 2 daily for 2 days, then 1 daily for 2 days  Take diclofenac 75 mg 1 twice daily for pain and inflammation  In addition to this you can take Tylenol (acetaminophen) 500 mg 2 pills 3 times daily as needed for pain.  Apply an ice pack to the knee about 4 times daily.  Return as needed.  If you have lab work done today you will be contacted with your lab results within the next 2 weeks.  If you have not heard from [place] then please contact us. The fastest way to get your results is to register for My Chart.   IF you received an x-ray today, you will receive an invoice from [facility] Radiology. Please contact [facility] Radiology at [phone] with questions or concerns regarding your invoice.   IF you received labwork today, you will receive an invoice from [place]. Please contact [facility] at [phone] with questions or concerns regarding your invoice.   Our billing staff will not be able to assist you with questions regarding bills from these companies.  You will be contacted with the lab results as soon as they are available. The fastest way to get your results is to activate your My Chart account. Instructions are located on the last page of this paperwork. If you have not heard from [place] regarding the results in 2 weeks, please contact this office.

## 2020-01-13 ENCOUNTER — Other Ambulatory Visit: Payer: Self-pay | Admitting: Family Medicine

## 2020-01-13 DIAGNOSIS — M25561 Pain in right knee: Secondary | ICD-10-CM

## 2020-01-13 NOTE — Telephone Encounter (Signed)
Requested medications are due for refill today?  Yes  Requested medications are on active medication list?  Yes  Last Refill:   01/01/2020  # 30 with no refills  Future visit scheduled?  No   Notes to Clinic:  Uncertain if this is a medication patient is to be taking long term, as there were no refills provided when medication was initially prescribed.  Patient seen on 01/01/2020 for acute knee pain.

## 2020-02-11 ENCOUNTER — Other Ambulatory Visit: Payer: Self-pay | Admitting: Obstetrics and Gynecology

## 2020-02-11 DIAGNOSIS — R928 Other abnormal and inconclusive findings on diagnostic imaging of breast: Secondary | ICD-10-CM

## 2020-02-16 ENCOUNTER — Ambulatory Visit: Payer: 59 | Admitting: Family Medicine

## 2020-02-16 ENCOUNTER — Other Ambulatory Visit: Payer: Self-pay

## 2020-02-16 VITALS — BP 140/90 | HR 78 | Temp 97.9°F | Ht 65.0 in | Wt 236.0 lb

## 2020-02-16 DIAGNOSIS — M25461 Effusion, right knee: Secondary | ICD-10-CM | POA: Diagnosis not present

## 2020-02-16 DIAGNOSIS — M25561 Pain in right knee: Secondary | ICD-10-CM

## 2020-02-16 MED ORDER — DICLOFENAC SODIUM 75 MG PO TBEC: DELAYED_RELEASE_TABLET | ORAL | 1 refills | Status: DC

## 2020-02-16 MED ORDER — TRAMADOL HCL 50 MG PO TABS: 50.0000 mg | ORAL_TABLET | Freq: Three times a day (TID) | ORAL | 0 refills | Status: AC | PRN

## 2020-02-16 NOTE — Progress Notes (Signed)
8/16/20214:47 PM  [name] [name] March 26, 1971, 49 y.o., female [id number]  Chief Complaint  Patient presents with  . Pain    pain in the right knee, 1 month follow up. medication did not work for the pain    HPI:   Patient is a 49 y.o. female with past medical history significant for HTN and iron def anemia who presents today for followup on right knee pain  Seen a month ago by Dr Linna Darner - DJD - rx pred and diclofenac Xray: joint effusion Patient reports she continues to have right knee pain/swelling Pain mostly along medial aspect Has been trying to elevated and use ace wrap However ace warp seems to make swelling worse She works as housekeeper Knee was slightly better while on meds but has ran out Denies any trauma prior to knee pain Has not been able to work for past several days  Depression screen [facility] 2/9 01/01/2020 12/10/2019 08/07/2019  Decreased Interest 0 0 0  Down, Depressed, Hopeless 0 0 0  PHQ - 2 Score 0 0 0    Fall Risk  01/01/2020 12/10/2019 08/07/2019 12/26/2018 06/10/2018  Falls in the past year? 0 0 0 0 0  Number falls in past yr: 0 0 0 0 -  Injury with Fall? 0 0 0 0 -  Follow up Falls evaluation completed Falls evaluation completed - Falls evaluation completed -     Allergies  Allergen Reactions  . Hctz [Hydrochlorothiazide]     Heart palpitations- pt is unsure of this    Prior to Admission medications   Medication Sig Start Date End Date Taking? Authorizing Provider  losartan (COZAAR) 100 MG tablet Take 1 tablet (100 mg total) by mouth daily. 08/07/19  Yes Rutherford Guys, MD    Past Medical History:  Diagnosis Date  . Anemia   . Fatty liver   . H. pylori infection   . HTN (hypertension)   . Hx of cardiovascular stress test    a. ETT-Myoview 05/28/12: Low risk study with a small, mild fixed basal inferior perfusion defect most likely representing soft tissue attenuation with inferolateral HK (wall motion abnormality does not correlate with inferior  perfusion defect), EF 49%.  Marland Kitchen Hx of echocardiogram    a. Echocardiogram 06/11/12: EF 50-55%, normal wall motion, grade 1 diastolic dysfunction, trivial AI, mild MR, mild LAE, mild RAE.  . RUQ pain     Past Surgical History:  Procedure Laterality Date  . NO PAST SURGERIES    . UPPER GASTROINTESTINAL ENDOSCOPY      Social History   Tobacco Use  . Smoking status: Never Smoker  . Smokeless tobacco: Never Used  Substance Use Topics  . Alcohol use: No    Family History  Problem Relation Age of Onset  . Coronary artery disease Mother   . Other Brother        fluid around heart  . Other Other        hepatic issues-? maternal side  . Stomach cancer Neg Hx   . Colon cancer Neg Hx   . Esophageal cancer Neg Hx   . Rectal cancer Neg Hx     ROS Per hpi  OBJECTIVE:  Today's Vitals   02/16/20 1624  BP: 140/90  Pulse: 78  Temp: 97.9 F (36.6 C)  SpO2: 100%  Weight: 236 lb (107 kg)  Height: 5\' 5"  (1.651 m)   Body mass index is 39.27 kg/m.   Physical Exam Vitals and nursing note reviewed.  Constitutional:  Appearance: She is well-developed.  HENT:     Head: Normocephalic and atraumatic.  Eyes:     General: No scleral icterus.    Conjunctiva/sclera: Conjunctivae normal.     Pupils: Pupils are equal, round, and reactive to light.  Pulmonary:     Effort: Pulmonary effort is normal.  Musculoskeletal:     Cervical back: Neck supple.     Right knee: Effusion present. No erythema. Decreased range of motion. Tenderness present over the medial joint line.  Skin:    General: Skin is warm and dry.  Neurological:     Mental Status: She is alert and oriented to person, place, and time.     No results found for this or any previous visit (from the past 24 hour(s)).  No results found.   ASSESSMENT and PLAN  1. Acute pain of right knee 2. Effusion of right knee Continued effusion in absence of DJD, ? Meniscal tear? In obese patient, referral to ortho. refill  diclofenac. rx for tramadol prn more severe pain. Reviewed r/se/b,  - diclofenac (VOLTAREN) 75 MG EC tablet; TAKE 1 TABLET(75 MG) BY MOUTH TWICE DAILY as needed for pain. Take with food. - Ambulatory referral to Orthopedic Surgery  Other orders - traMADol (ULTRAM) 50 MG tablet; Take 1 tablet (50 mg total) by mouth every 8 (eight) hours as needed for up to 5 days.  No follow-ups on file.    Rutherford Guys, MD [facility] [place], [place] [address] Ph.  [phone] Fax [phone]

## 2020-02-16 NOTE — Patient Instructions (Addendum)
If you have lab work done today you will be contacted with your lab results within the next 2 weeks.  If you have not heard from [place] then please contact us. The fastest way to get your results is to register for My Chart.   IF you received an x-ray today, you will receive an invoice from [facility] Radiology. Please contact [facility] Radiology at [phone] with questions or concerns regarding your invoice.   IF you received labwork today, you will receive an invoice from [place]. Please contact [facility] at [phone] with questions or concerns regarding your invoice.   Our billing staff will not be able to assist you with questions regarding bills from these companies.  You will be contacted with the lab results as soon as they are available. The fastest way to get your results is to activate your My Chart account. Instructions are located on the last page of this paperwork. If you have not heard from [place] regarding the results in 2 weeks, please contact this office.     Knee Effusion  Knee effusion means that you have extra fluid in your knee. This can cause pain. Your knee may be more difficult to bend and move. What are the causes? Common causes are:  Arthritis.  Infection.  Injury.  Autoimmune disease. This means that your body's defense system (immune system) mistakenly attacks healthy body tissues. Follow these instructions at home: Medicines  Take medicines only as told by your doctor.  Do not drive or use heavy machinery while taking prescription pain medicine.  If you are taking prescription pain medicine, take actions to help prevent or treat trouble pooping (constipation). Your doctor may recommend that you: ? Drink enough fluid to keep your pee (urine) pale yellow. ? Eat foods that are high in fiber. These include fresh fruits and vegetables, whole grains, and beans. ? Avoid eating fatty or sweet foods. ? Take a medicine for constipation. If you have a  brace:  Wear the brace as told by your doctor. Remove it only as told by your doctor.  Loosen the brace if your toes tingle, become numb, or turn cold and blue.  Keep the brace clean.  If the brace is not waterproof: ? Do not let it get wet. ? Cover it with a watertight covering when you take a bath or a shower. Managing pain, stiffness, and swelling   If told, apply ice to the swollen area: ? If you have a removable brace, remove it as told by your doctor. ? Put ice in a plastic bag. ? Place a towel between your skin and the bag. ? Leave the ice on for 20 minutes, 2-3 times per day.  Keep your knee raised (elevated) when you are sitting or lying down. General instructions  Do not use any products that contain nicotine or tobacco, such as cigarettes and e-cigarettes. These can delay healing. If you need help quitting, ask your doctor.  Use crutches as told by your doctor.  Do exercises as told by your doctor.  Rest as told by your doctor.  Keep all follow-up visits as told by your doctor. This is important. Contact a doctor if you:  Continue to have pain in your knee. Get help right away if you:  Have swelling or redness of your knee that gets worse or does not get better.  Have serious pain in your knee.  Have a fever. Summary  Knee effusion is when you have extra fluid in  your knee. This causes pain and swelling and makes it hard to bend and move your knee.  Take medicines only as told by your doctor.  If you have a brace, wear the brace as told by your doctor. This information is not intended to replace advice given to you by your health care provider. Make sure you discuss any questions you have with your health care provider. Document Revised: 07/02/2017 Document Reviewed: 07/01/2017 Elsevier Patient Education  2020 [employer].

## 2020-02-17 ENCOUNTER — Encounter: Payer: Self-pay | Admitting: Family

## 2020-02-17 ENCOUNTER — Ambulatory Visit: Payer: 59 | Admitting: Family

## 2020-02-17 VITALS — Ht 65.0 in | Wt 236.0 lb

## 2020-02-17 DIAGNOSIS — M25561 Pain in right knee: Secondary | ICD-10-CM | POA: Diagnosis not present

## 2020-02-17 MED ORDER — METHYLPREDNISOLONE ACETATE 40 MG/ML IJ SUSP
40.0000 mg | INTRAMUSCULAR | Status: AC | PRN
  Administered 2020-02-17: 40 mg via INTRA_ARTICULAR

## 2020-02-17 MED ORDER — LIDOCAINE HCL 1 % IJ SOLN
5.0000 mL | INTRAMUSCULAR | Status: AC | PRN
  Administered 2020-02-17: 5 mL

## 2020-02-17 NOTE — Progress Notes (Signed)
Office Visit Note   Patient: [name] [name]           Date of Birth: 1971-06-24           MRN: [id number] Visit Date: 02/17/2020              Requested by: Rutherford Guys, MD [address] [place],  [place] [address] PCP: Maximiano Coss, NP  Chief Complaint  Patient presents with  . Right Knee - Pain      HPI: The patient is a 49 year old woman who presents today with a little over 63-month history of right knee pain this is associated swelling.  She cannot recall a specific injury related to this.  She has tried diclofenac as well as tramadol without much relief of her pain.  She did have radiographs on July 1 which showed no bony abnormality she did have a joint effusion at the time.  She is having pain with ambulation tightness especially in the popliteal fossa.  A pinching feeling of the lateral joint line.  Unfortunately she must squat bend and stoop quite often for her work.  Assessment & Plan: Visit Diagnoses:  1. Acute pain of right knee     Plan: Attempted aspiration today.  Injection Depo-Medrol.  Patient voiced immediate relief following injection.  She will follow-up in the office in 2 to 3 weeks if she is having continued issues.  Consider MRI evaluate for meniscal injury.  Follow-Up Instructions: Return in about 2 weeks (around 03/02/2020), or if symptoms worsen or fail to improve.   Right Knee Exam   Muscle Strength  The patient has normal right knee strength.  Tenderness  The patient is experiencing tenderness in the lateral joint line.  Range of Motion  The patient has normal right knee ROM.  Other  Erythema: absent Sensation: normal Swelling: moderate  Comments:  Some MCL laxity, does have firm endpoint.      Patient is alert, oriented, no adenopathy, well-dressed, normal affect, normal respiratory effort.   Imaging: No results found. No images are attached to the encounter.  Labs: Lab Results  Component Value Date   HGBA1C 5.0  08/19/2016   ESRSEDRATE 20 05/07/2007   GRAMSTAIN Rare 04/03/2016   GRAMSTAIN WBC present-predominately PMN 04/03/2016   GRAMSTAIN No Squamous Epithelial Cells Seen 04/03/2016   GRAMSTAIN No Organisms Seen 04/03/2016   LABORGA NO GROWTH 2 DAYS 04/03/2016     Lab Results  Component Value Date   ALBUMIN 4.3 12/10/2019   ALBUMIN 4.3 08/08/2019   ALBUMIN 4.3 12/26/2018    Lab Results  Component Value Date   MG 1.9 05/07/2007   Lab Results  Component Value Date   VD25OH 7.9 (L) 08/19/2016    No results found for: PREALBUMIN CBC EXTENDED Latest Ref Rng & Units 08/08/2019 12/26/2018 09/08/2017  WBC 3.4 - 10.8 x10E3/uL 3.7 3.2(L) 4.2(A)  RBC 3.77 - 5.28 x10E6/uL 4.36 4.17 3.63(A)  HGB 11.1 - 15.9 g/dL 12.0 8.8(L) 9.5(A)  HCT 34.0 - 46.6 % 37.6 31.7(L) 30.6(A)  PLT 150 - 450 x10E3/uL 205 287 -  NEUTROABS 1 - 7 x10E3/uL - 1.6 -  LYMPHSABS 0 - 3 x10E3/uL - 1.0 -     Body mass index is 39.27 kg/m.  Orders:  No orders of the defined types were placed in this encounter.  No orders of the defined types were placed in this encounter.    Procedures: Large Joint Inj: R knee on 02/17/2020 3:01 PM Indications: pain Details: 18 G  1.5 in needle, anteromedial approach Medications: 5 mL lidocaine 1 %; 40 mg methylPREDNISolone acetate 40 MG/ML Aspirate: 0 mL Outcome: tolerated well, no immediate complications Consent was given by the patient.      Clinical Data: No additional findings.  ROS:  All other systems negative, except as noted in the HPI. Review of Systems  Constitutional: Negative for chills and fever.  Musculoskeletal: Positive for arthralgias and joint swelling.    Objective: Vital Signs: Ht 5\' 5"  (1.651 m)   Wt 236 lb (107 kg)   BMI 39.27 kg/m   Specialty Comments:  No specialty comments available.  PMFS History: Patient Active Problem List   Diagnosis Date Noted  . Contusion of right elbow 06/10/2018  . Right elbow pain 06/10/2018  . Lumbar strain,  initial encounter 09/18/2017  . Lumbar pain 09/18/2017  . Lung nodule 08/26/2016  . PVC (premature ventricular contraction) 05/26/2014  . Diverticulosis 04/14/2014  . GERD (gastroesophageal reflux disease) 11/19/2012  . Chest pain 05/21/2012  . Obesity, morbid (more than 100 lbs over ideal weight or BMI > 40) ([place]) 05/30/2011  . Essential hypertension 04/29/2009   Past Medical History:  Diagnosis Date  . Anemia   . Fatty liver   . H. pylori infection   . HTN (hypertension)   . Hx of cardiovascular stress test    a. ETT-Myoview 05/28/12: Low risk study with a small, mild fixed basal inferior perfusion defect most likely representing soft tissue attenuation with inferolateral HK (wall motion abnormality does not correlate with inferior perfusion defect), EF 49%.  Marland Kitchen Hx of echocardiogram    a. Echocardiogram 06/11/12: EF 50-55%, normal wall motion, grade 1 diastolic dysfunction, trivial AI, mild MR, mild LAE, mild RAE.  . RUQ pain     Family History  Problem Relation Age of Onset  . Coronary artery disease Mother   . Other Brother        fluid around heart  . Other Other        hepatic issues-? maternal side  . Stomach cancer Neg Hx   . Colon cancer Neg Hx   . Esophageal cancer Neg Hx   . Rectal cancer Neg Hx     Past Surgical History:  Procedure Laterality Date  . NO PAST SURGERIES    . UPPER GASTROINTESTINAL ENDOSCOPY     Social History   Occupational History  . Occupation: Housekeeper    Comment: at hotel    Employer: [employer]  Tobacco Use  . Smoking status: Never Smoker  . Smokeless tobacco: Never Used  Vaping Use  . Vaping Use: Never used  Substance and Sexual Activity  . Alcohol use: No  . Drug use: No  . Sexual activity: Yes    Birth control/protection: None

## 2020-02-25 ENCOUNTER — Other Ambulatory Visit: Payer: Self-pay

## 2020-02-25 ENCOUNTER — Other Ambulatory Visit: Payer: Self-pay | Admitting: Obstetrics and Gynecology

## 2020-02-25 ENCOUNTER — Ambulatory Visit
Admission: RE | Admit: 2020-02-25 | Discharge: 2020-02-25 | Disposition: A | Discharge status: Home / Self Care | Payer: 59 | Source: Ambulatory Visit | Attending: Obstetrics and Gynecology | Admitting: Obstetrics and Gynecology

## 2020-02-25 DIAGNOSIS — N6489 Other specified disorders of breast: Secondary | ICD-10-CM

## 2020-02-25 DIAGNOSIS — R928 Other abnormal and inconclusive findings on diagnostic imaging of breast: Secondary | ICD-10-CM

## 2020-03-04 ENCOUNTER — Ambulatory Visit
Admission: RE | Admit: 2020-03-04 | Discharge: 2020-03-04 | Disposition: A | Discharge status: Home / Self Care | Payer: 59 | Source: Ambulatory Visit | Attending: Obstetrics and Gynecology | Admitting: Obstetrics and Gynecology

## 2020-03-04 ENCOUNTER — Other Ambulatory Visit: Payer: Self-pay

## 2020-03-04 DIAGNOSIS — N6489 Other specified disorders of breast: Secondary | ICD-10-CM

## 2020-03-09 ENCOUNTER — Encounter: Payer: Self-pay | Admitting: Registered Nurse

## 2020-03-09 HISTORY — PX: BREAST BIOPSY: SHX20

## 2020-03-17 ENCOUNTER — Ambulatory Visit: Payer: 59 | Admitting: Physician Assistant

## 2020-03-17 ENCOUNTER — Encounter: Payer: Self-pay | Admitting: Physician Assistant

## 2020-03-17 DIAGNOSIS — M23321 Other meniscus derangements, posterior horn of medial meniscus, right knee: Secondary | ICD-10-CM

## 2020-03-17 NOTE — Progress Notes (Signed)
Office Visit Note   Patient: [name] [name]           Date of Birth: 05-26-1971           MRN: [id number] Visit Date: 03/17/2020              Requested by: Maximiano Coss, NP [place],  [place] [address] PCP: Maximiano Coss, NP  Chief Complaint  Patient presents with  . Right Knee - Pain      HPI: This is a pleasant 49 year old woman with a almost 32-month history of right knee pain swelling catching and locking.  She denies any specific injury.  She has tried conservative treatment such as icing elevation and activity modification.  At her last visit she was given an injection of steroid.  This only lasted a very short time.  She has been taking anti-inflammatories with minimal improvement.  Pain is focused over the lateral joint line.  Assessment & Plan: Visit Diagnoses:  1. Other meniscus derangements, posterior horn of medial meniscus, right knee     Plan: She has failed conservative treatment including anti-inflammatories activity modification and a steroid injection.  She did not have any problems with her knee prior to a few months ago.  She also has mechanical symptoms such as catching and pain with terminal extension and flexion.  We will go forward with an MRI.  Should follow-up with Dr. Sharol Given for an MRI review.  If findings consistent with a meniscus tear would recommend a knee arthroscopy debridement  Follow-Up Instructions: No follow-ups on file.   Ortho Exam  Patient is alert, oriented, no adenopathy, well-dressed, normal affect, normal respiratory effort. Right knee no cellulitis no effusion.  She has tenderness in the popliteal fossa and on the lateral joint line.  Pain is accentuated with terminal extension and flexion.  Good endpoint on anterior draw.  Compartments of the lower leg are nontender.  Imaging: No results found. No images are attached to the encounter.  Labs: Lab Results  Component Value Date   HGBA1C 5.0 08/19/2016   ESRSEDRATE 20  05/07/2007   GRAMSTAIN Rare 04/03/2016   GRAMSTAIN WBC present-predominately PMN 04/03/2016   GRAMSTAIN No Squamous Epithelial Cells Seen 04/03/2016   GRAMSTAIN No Organisms Seen 04/03/2016   LABORGA NO GROWTH 2 DAYS 04/03/2016     Lab Results  Component Value Date   ALBUMIN 4.3 12/10/2019   ALBUMIN 4.3 08/08/2019   ALBUMIN 4.3 12/26/2018    Lab Results  Component Value Date   MG 1.9 05/07/2007   Lab Results  Component Value Date   VD25OH 7.9 (L) 08/19/2016    No results found for: PREALBUMIN CBC EXTENDED Latest Ref Rng & Units 08/08/2019 12/26/2018 09/08/2017  WBC 3.4 - 10.8 x10E3/uL 3.7 3.2(L) 4.2(A)  RBC 3.77 - 5.28 x10E6/uL 4.36 4.17 3.63(A)  HGB 11.1 - 15.9 g/dL 12.0 8.8(L) 9.5(A)  HCT 34.0 - 46.6 % 37.6 31.7(L) 30.6(A)  PLT 150 - 450 x10E3/uL 205 287 -  NEUTROABS 1 - 7 x10E3/uL - 1.6 -  LYMPHSABS 0 - 3 x10E3/uL - 1.0 -     There is no height or weight on file to calculate BMI.  Orders:  Orders Placed This Encounter  Procedures  . MR Knee Right w/o contrast   No orders of the defined types were placed in this encounter.    Procedures: No procedures performed  Clinical Data: No additional findings.  ROS:  All other systems negative, except as noted in the HPI.  Review of Systems  Objective: Vital Signs: There were no vitals taken for this visit.  Specialty Comments:  No specialty comments available.  PMFS History: Patient Active Problem List   Diagnosis Date Noted  . Contusion of right elbow 06/10/2018  . Right elbow pain 06/10/2018  . Lumbar strain, initial encounter 09/18/2017  . Lumbar pain 09/18/2017  . Lung nodule 08/26/2016  . PVC (premature ventricular contraction) 05/26/2014  . Diverticulosis 04/14/2014  . GERD (gastroesophageal reflux disease) 11/19/2012  . Chest pain 05/21/2012  . Obesity, morbid (more than 100 lbs over ideal weight or BMI > 40) ([place]) 05/30/2011  . Essential hypertension 04/29/2009   Past Medical History:    Diagnosis Date  . Anemia   . Fatty liver   . H. pylori infection   . HTN (hypertension)   . Hx of cardiovascular stress test    a. ETT-Myoview 05/28/12: Low risk study with a small, mild fixed basal inferior perfusion defect most likely representing soft tissue attenuation with inferolateral HK (wall motion abnormality does not correlate with inferior perfusion defect), EF 49%.  Marland Kitchen Hx of echocardiogram    a. Echocardiogram 06/11/12: EF 50-55%, normal wall motion, grade 1 diastolic dysfunction, trivial AI, mild MR, mild LAE, mild RAE.  . RUQ pain     Family History  Problem Relation Age of Onset  . Coronary artery disease Mother   . Other Brother        fluid around heart  . Other Other        hepatic issues-? maternal side  . Stomach cancer Neg Hx   . Colon cancer Neg Hx   . Esophageal cancer Neg Hx   . Rectal cancer Neg Hx     Past Surgical History:  Procedure Laterality Date  . NO PAST SURGERIES    . UPPER GASTROINTESTINAL ENDOSCOPY     Social History   Occupational History  . Occupation: Housekeeper    Comment: at hotel    Employer: [employer]  Tobacco Use  . Smoking status: Never Smoker  . Smokeless tobacco: Never Used  Vaping Use  . Vaping Use: Never used  Substance and Sexual Activity  . Alcohol use: No  . Drug use: No  . Sexual activity: Yes    Birth control/protection: None

## 2020-03-22 ENCOUNTER — Telehealth: Payer: Self-pay | Admitting: Orthopedic Surgery

## 2020-03-22 NOTE — Telephone Encounter (Signed)
Spoke with pt and she stated that she would have to check work schedule. Pt will call and set MRI result reading appt at a later date.

## 2020-04-06 ENCOUNTER — Other Ambulatory Visit: Payer: Self-pay | Admitting: Registered Nurse

## 2020-04-06 DIAGNOSIS — I10 Essential (primary) hypertension: Secondary | ICD-10-CM

## 2020-04-13 ENCOUNTER — Ambulatory Visit
Admission: RE | Admit: 2020-04-13 | Discharge: 2020-04-13 | Disposition: A | Discharge status: Home / Self Care | Payer: 59 | Source: Ambulatory Visit | Attending: Physician Assistant | Admitting: Physician Assistant

## 2020-04-13 ENCOUNTER — Other Ambulatory Visit: Payer: Self-pay

## 2020-04-13 DIAGNOSIS — M23321 Other meniscus derangements, posterior horn of medial meniscus, right knee: Secondary | ICD-10-CM

## 2020-04-15 ENCOUNTER — Ambulatory Visit (INDEPENDENT_AMBULATORY_CARE_PROVIDER_SITE_OTHER): Payer: 59 | Admitting: Orthopedic Surgery

## 2020-04-15 ENCOUNTER — Encounter: Payer: Self-pay | Admitting: Orthopedic Surgery

## 2020-04-15 DIAGNOSIS — M23321 Other meniscus derangements, posterior horn of medial meniscus, right knee: Secondary | ICD-10-CM

## 2020-04-15 NOTE — Progress Notes (Signed)
Office Visit Note   Patient: [name] [name]           Date of Birth: [birth year]           MRN: [id number] Visit Date: 04/15/2020              Requested by: Maximiano Coss, NP [place],  [place] [address] PCP: Maximiano Coss, NP  Chief Complaint  Patient presents with  . Right Knee - Follow-up      HPI: Patient is a 49 year old woman who is seen in follow-up status post MRI scan of her right knee patient states she does have swelling occasional superior laterally as well as in the popliteal fossa she states the swelling in the back of her knee is painful also has pain over the medial joint line.  Assessment & Plan: Visit Diagnoses:  1. Other meniscus derangements, posterior horn of medial meniscus, right knee     Plan: Discussed treatment options from using Voltaren gel versus steroid injection versus arthroscopic intervention.  Patient states she is not interested in surgery at this time she will try the Voltaren gel.  Discussed risks and benefits of arthroscopic intervention and the risk of persistent pain and need for additional surgery.  Follow-Up Instructions: Return if symptoms worsen or fail to improve.   Ortho Exam  Patient is alert, oriented, no adenopathy, well-dressed, normal affect, normal respiratory effort. Examination patient does have a mild effusion she has tenderness to palpation in the popliteal fossa she is tender to palpation of the medial joint line.  Review of the MRI scan shows a medial meniscal tear as well as a small Baker's cyst and degenerative arthritic changes at the medial joint line.  Imaging: No results found. No images are attached to the encounter.  Labs: Lab Results  Component Value Date   HGBA1C 5.0 08/19/2016   ESRSEDRATE 20 05/07/2007   GRAMSTAIN Rare 04/03/2016   GRAMSTAIN WBC present-predominately PMN 04/03/2016   GRAMSTAIN No Squamous Epithelial Cells Seen 04/03/2016   GRAMSTAIN No Organisms Seen 04/03/2016    LABORGA NO GROWTH 2 DAYS 04/03/2016     Lab Results  Component Value Date   ALBUMIN 4.3 12/10/2019   ALBUMIN 4.3 08/08/2019   ALBUMIN 4.3 12/26/2018    Lab Results  Component Value Date   MG 1.9 05/07/2007   Lab Results  Component Value Date   VD25OH 7.9 (L) 08/19/2016    No results found for: PREALBUMIN CBC EXTENDED Latest Ref Rng & Units 08/08/2019 12/26/2018 09/08/2017  WBC 3.4 - 10.8 x10E3/uL 3.7 3.2(L) 4.2(A)  RBC 3.77 - 5.28 x10E6/uL 4.36 4.17 3.63(A)  HGB 11.1 - 15.9 g/dL 12.0 8.8(L) 9.5(A)  HCT 34.0 - 46.6 % 37.6 31.7(L) 30.6(A)  PLT 150 - 450 x10E3/uL 205 287 -  NEUTROABS 1 - 7 x10E3/uL - 1.6 -  LYMPHSABS 0 - 3 x10E3/uL - 1.0 -     There is no height or weight on file to calculate BMI.  Orders:  No orders of the defined types were placed in this encounter.  No orders of the defined types were placed in this encounter.    Procedures: No procedures performed  Clinical Data: No additional findings.  ROS:  All other systems negative, except as noted in the HPI. Review of Systems  Objective: Vital Signs: There were no vitals taken for this visit.  Specialty Comments:  No specialty comments available.  PMFS History: Patient Active Problem List   Diagnosis Date Noted  . Contusion  of right elbow 06/10/2018  . Right elbow pain 06/10/2018  . Lumbar strain, initial encounter 09/18/2017  . Lumbar pain 09/18/2017  . Lung nodule 08/26/2016  . PVC (premature ventricular contraction) 05/26/2014  . Diverticulosis 04/14/2014  . GERD (gastroesophageal reflux disease) 11/19/2012  . Chest pain 05/21/2012  . Obesity, morbid (more than 100 lbs over ideal weight or BMI > 40) ([place]) 05/30/2011  . Essential hypertension 04/29/2009   Past Medical History:  Diagnosis Date  . Anemia   . Fatty liver   . H. pylori infection   . HTN (hypertension)   . Hx of cardiovascular stress test    a. ETT-Myoview 05/28/12: Low risk study with a small, mild fixed basal inferior  perfusion defect most likely representing soft tissue attenuation with inferolateral HK (wall motion abnormality does not correlate with inferior perfusion defect), EF 49%.  Marland Kitchen Hx of echocardiogram    a. Echocardiogram 06/11/12: EF 50-55%, normal wall motion, grade 1 diastolic dysfunction, trivial AI, mild MR, mild LAE, mild RAE.  . RUQ pain     Family History  Problem Relation Age of Onset  . Coronary artery disease Mother   . Other Brother        fluid around heart  . Other Other        hepatic issues-? maternal side  . Stomach cancer Neg Hx   . Colon cancer Neg Hx   . Esophageal cancer Neg Hx   . Rectal cancer Neg Hx     Past Surgical History:  Procedure Laterality Date  . NO PAST SURGERIES    . UPPER GASTROINTESTINAL ENDOSCOPY     Social History   Occupational History  . Occupation: Housekeeper    Comment: at hotel    Employer: [employer]  Tobacco Use  . Smoking status: Never Smoker  . Smokeless tobacco: Never Used  Vaping Use  . Vaping Use: Never used  Substance and Sexual Activity  . Alcohol use: No  . Drug use: No  . Sexual activity: Yes    Birth control/protection: None

## 2021-02-01 IMAGING — MR MR KNEE*R* W/O CM
4 of 6 series · 19 of 40 positions shown · non-contrast
Comparison: Right knee x-rays dated January 01, 2020.

CLINICAL DATA: Diffuse right knee pain for the past 5 months. No
injury or prior surgery.

EXAM:
MRI OF THE RIGHT KNEE WITHOUT CONTRAST
TECHNIQUE: Multiplanar, multisequence MR imaging of the knee was performed. No
intravenous contrast was administered.

[Series 5: T2 fat-sat · coronal · 4.0mm · 0.29mm/px · 3 of 27 slices shown (1 of 2)]
[im 6/27]
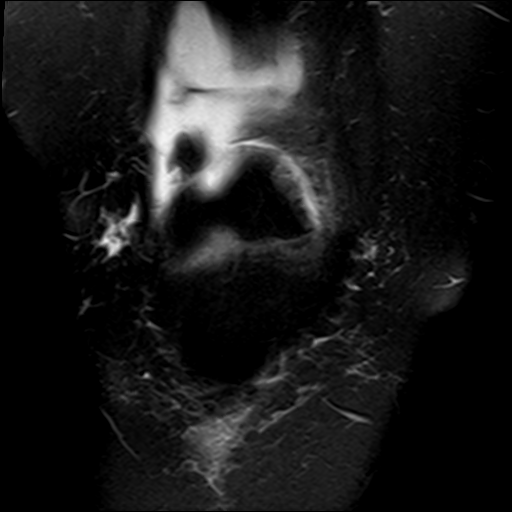
[im 16/27]
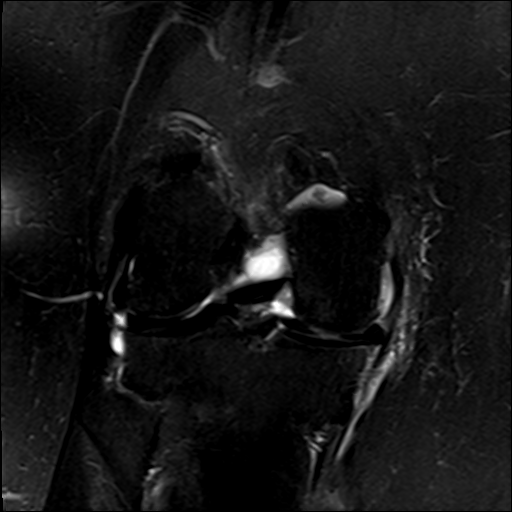
[im 27/27]
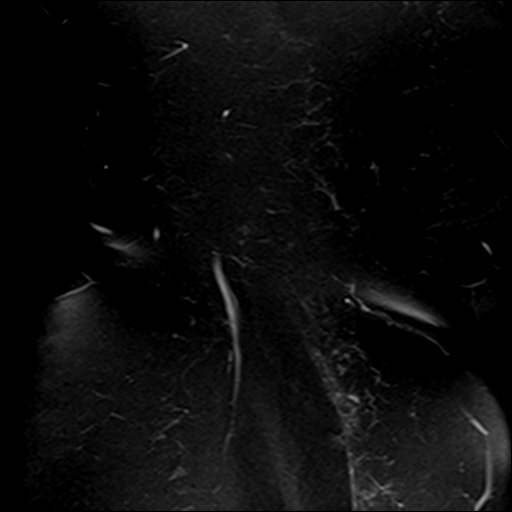

[Series 6: PD fat-sat · coronal · 3.0mm · 0.29mm/px · 7 of 28 slices shown (1 of 2)]
[im 1/28]
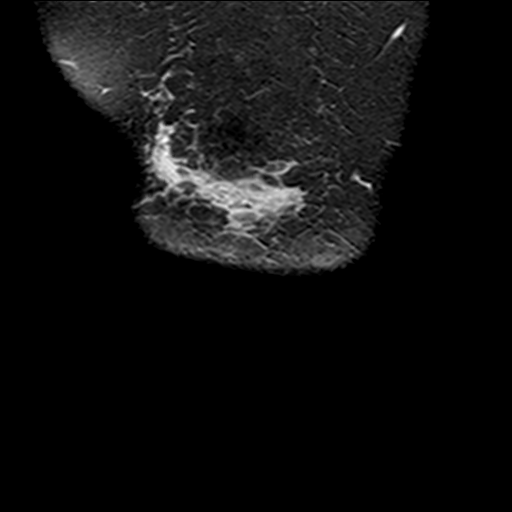
[im 5/28]
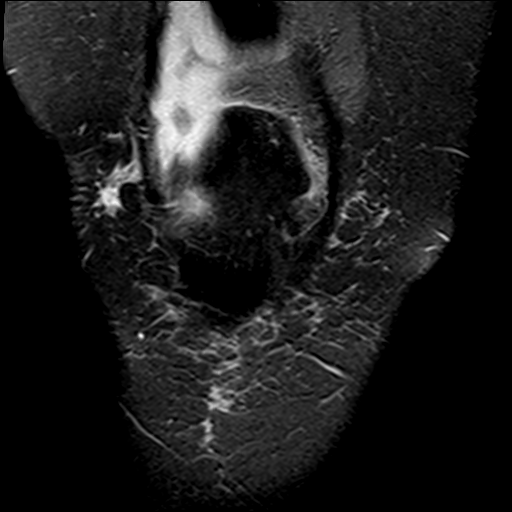
[im 10/28]
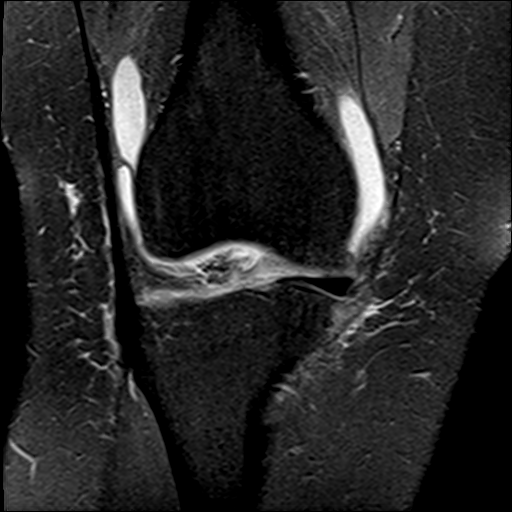
[im 14/28]
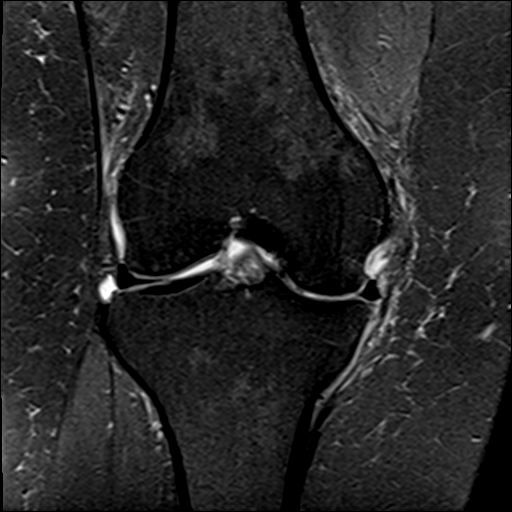
[im 19/28]
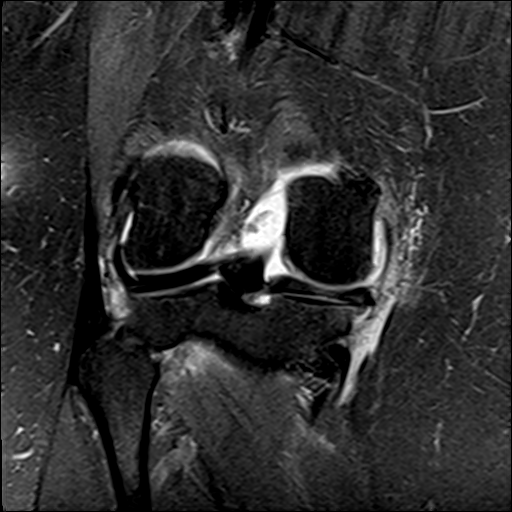
[im 23/28]
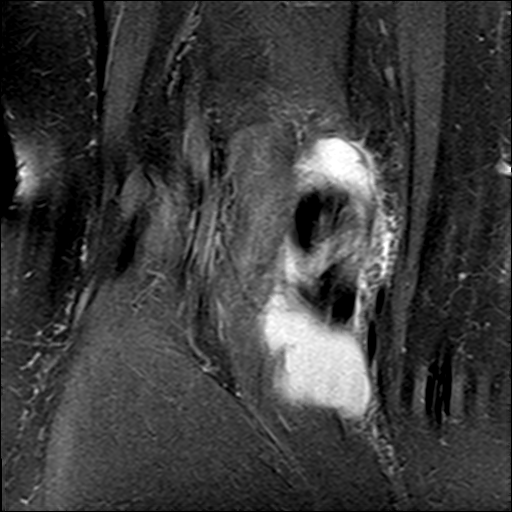
[im 28/28]
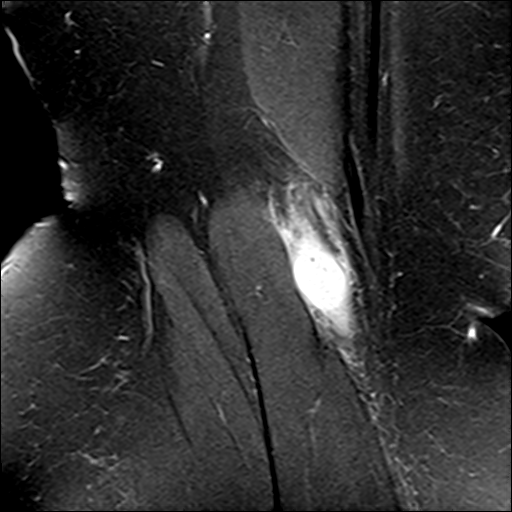

[Series 7: PD fat-sat · sagittal · 3.0mm · 0.29mm/px · 6 of 28 slices shown (2 of 2)]
[im 1/28]
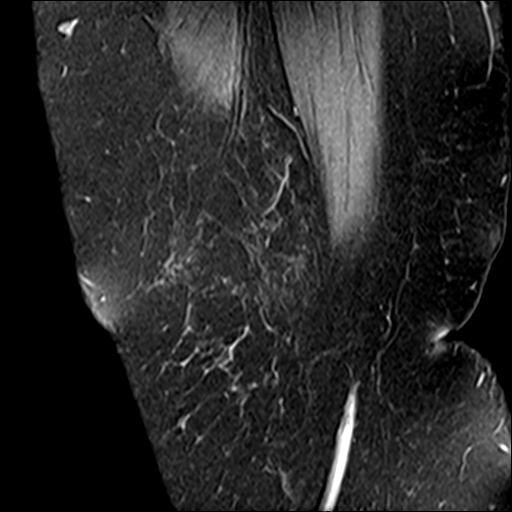
[im 5/28]
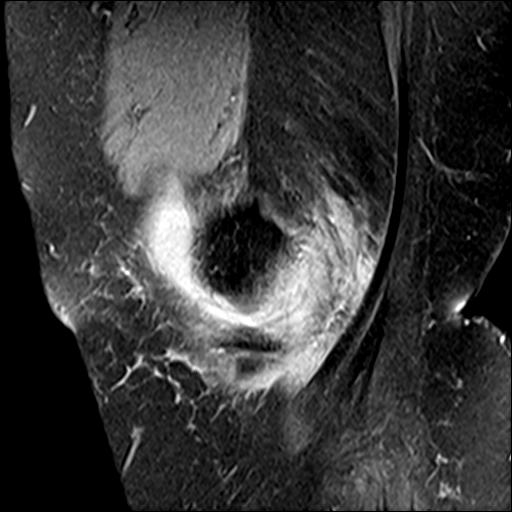
[im 10/28]
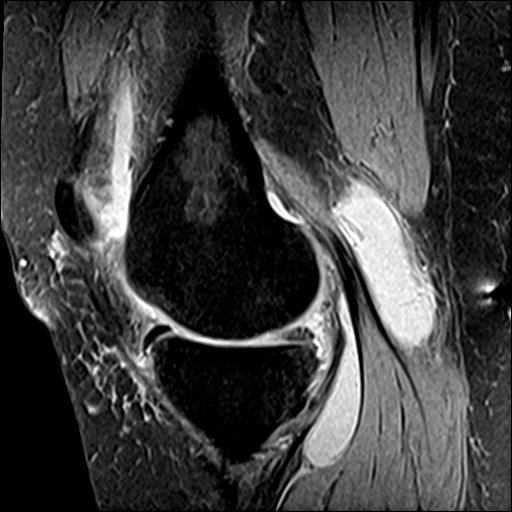
[im 14/28]
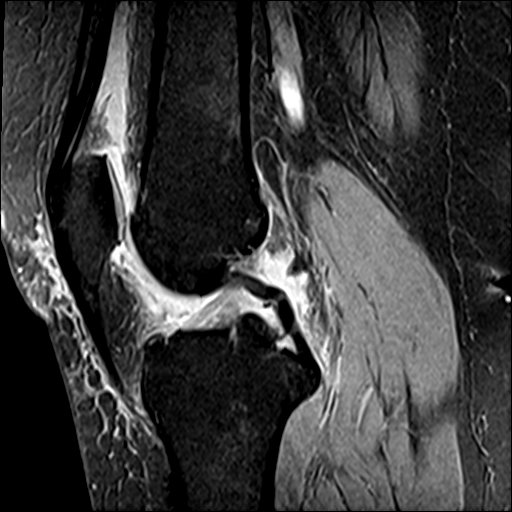
[im 19/28]
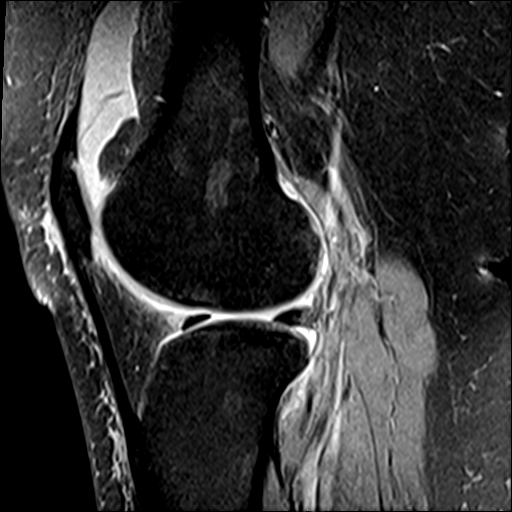
[im 23/28]
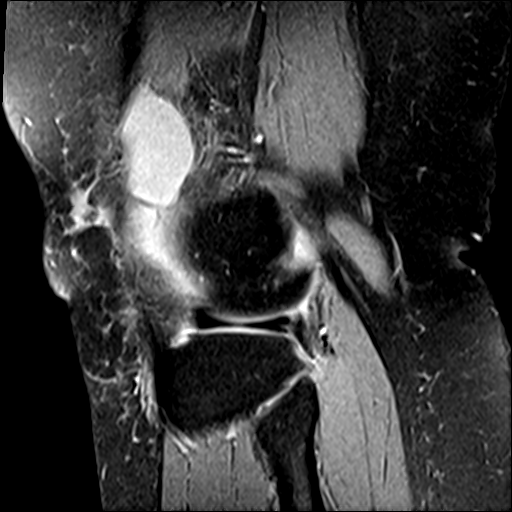

[Series 8: T2 fat-sat · sagittal · 3.0mm · 0.29mm/px · 3 of 28 slices shown (2 of 2)]
[im 5/28]
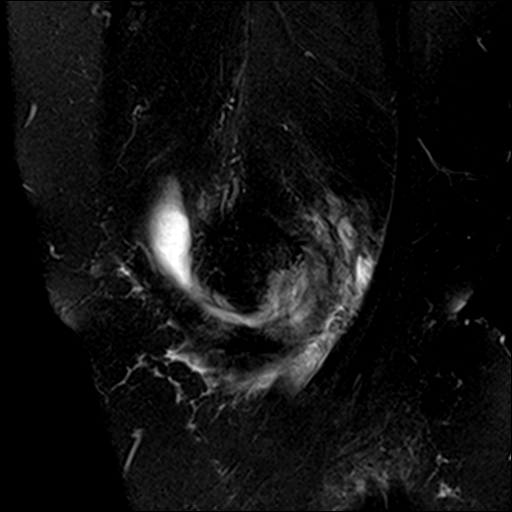
[im 14/28]
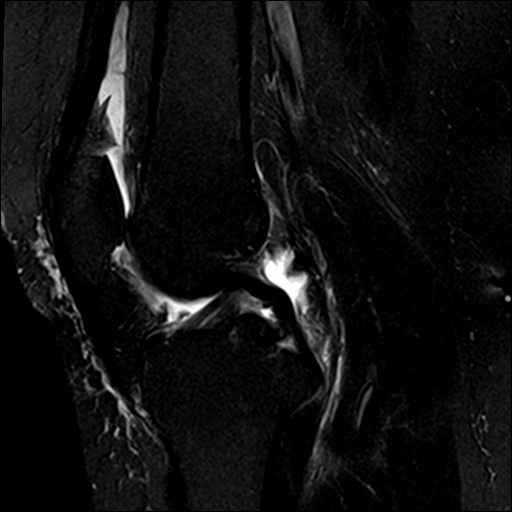
[im 23/28]
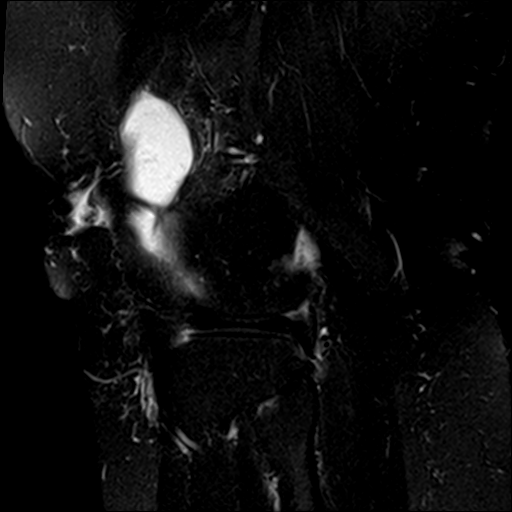

[19 of 40 positions shown; findings below may reference images not displayed]

FINDINGS: MENISCI

Medial meniscus: Oblique horizontal tear of the body extending into
the posterior horn. Free edge fraying of the posterior horn.
Extrusion of the body.

Lateral meniscus:  Intact.

LIGAMENTS

Cruciates:  Intact ACL and PCL.

Collaterals: Medial collateral ligament is intact. Lateral
collateral ligament complex is intact.

CARTILAGE

Patellofemoral: Mild thinning and superficial irregularity. No focal
defect.

Medial: Partial and full-thickness cartilage loss over the central
weight-bearing medial femoral condyle and medial tibial plateau.

Lateral: Focal high-grade partial-thickness cartilage loss over the
posterior nonweightbearing lateral femoral condyle.

Joint: Small to moderate joint effusion. Mild edema within Hoffa's
fat.

Popliteal Fossa:  Moderate Baker cyst.  Intact popliteus tendon.

Extensor Mechanism: Intact quadriceps tendon and patellar tendon.
Intact medial and lateral patellar retinaculum. Intact MPFL.

Bones: No acute fracture or dislocation. No suspicious bone lesion.

Other: None.
IMPRESSION: 1. Oblique horizontal tear of the medial meniscus body extending
into the posterior horn. Extrusion of the body.
2. Mild medial compartment osteoarthritis.
3. Small to moderate joint effusion. Moderate Baker cyst.

## 2021-03-15 ENCOUNTER — Emergency Department (HOSPITAL_COMMUNITY)
Admission: EM | Admit: 2021-03-15 | Discharge: 2021-03-15 | Disposition: A | Discharge status: Home / Self Care | Payer: 59 | Attending: Emergency Medicine | Admitting: Emergency Medicine

## 2021-03-15 ENCOUNTER — Encounter (HOSPITAL_COMMUNITY): Payer: Self-pay

## 2021-03-15 ENCOUNTER — Emergency Department (HOSPITAL_COMMUNITY): Payer: 59

## 2021-03-15 ENCOUNTER — Other Ambulatory Visit: Payer: Self-pay

## 2021-03-15 DIAGNOSIS — R110 Nausea: Secondary | ICD-10-CM | POA: Diagnosis not present

## 2021-03-15 DIAGNOSIS — Z79899 Other long term (current) drug therapy: Secondary | ICD-10-CM | POA: Diagnosis not present

## 2021-03-15 DIAGNOSIS — R0602 Shortness of breath: Secondary | ICD-10-CM | POA: Insufficient documentation

## 2021-03-15 DIAGNOSIS — K219 Gastro-esophageal reflux disease without esophagitis: Secondary | ICD-10-CM | POA: Diagnosis not present

## 2021-03-15 DIAGNOSIS — R030 Elevated blood-pressure reading, without diagnosis of hypertension: Secondary | ICD-10-CM | POA: Diagnosis present

## 2021-03-15 DIAGNOSIS — R1011 Right upper quadrant pain: Secondary | ICD-10-CM | POA: Insufficient documentation

## 2021-03-15 DIAGNOSIS — I10 Essential (primary) hypertension: Secondary | ICD-10-CM | POA: Diagnosis not present

## 2021-03-15 DIAGNOSIS — R519 Headache, unspecified: Secondary | ICD-10-CM | POA: Diagnosis not present

## 2021-03-15 LAB — URINALYSIS, ROUTINE W REFLEX MICROSCOPIC
Bilirubin Urine: NEGATIVE
Glucose, UA: NEGATIVE mg/dL
Hgb urine dipstick: NEGATIVE
Ketones, ur: NEGATIVE mg/dL
Leukocytes,Ua: NEGATIVE
Nitrite: NEGATIVE
Protein, ur: NEGATIVE mg/dL
Specific Gravity, Urine: 1.006 (ref 1.005–1.030)
pH: 6 (ref 5.0–8.0)

## 2021-03-15 LAB — CBC WITH DIFFERENTIAL/PLATELET
Abs Immature Granulocytes: 0 10*3/uL (ref 0.00–0.07)
Basophils Absolute: 0 10*3/uL (ref 0.0–0.1)
Basophils Relative: 1 %
Eosinophils Absolute: 0.1 10*3/uL (ref 0.0–0.5)
Eosinophils Relative: 2 %
HCT: 37.3 % (ref 36.0–46.0)
Hemoglobin: 11.1 g/dL — ABNORMAL LOW (ref 12.0–15.0)
Immature Granulocytes: 0 %
Lymphocytes Relative: 48 %
Lymphs Abs: 2.2 10*3/uL (ref 0.7–4.0)
MCH: 25 pg — ABNORMAL LOW (ref 26.0–34.0)
MCHC: 29.8 g/dL — ABNORMAL LOW (ref 30.0–36.0)
MCV: 84 fL (ref 80.0–100.0)
Monocytes Absolute: 0.5 10*3/uL (ref 0.1–1.0)
Monocytes Relative: 11 %
Neutro Abs: 1.8 10*3/uL (ref 1.7–7.7)
Neutrophils Relative %: 38 %
Platelets: 257 10*3/uL (ref 150–400)
RBC: 4.44 MIL/uL (ref 3.87–5.11)
RDW: 16.1 % — ABNORMAL HIGH (ref 11.5–15.5)
WBC: 4.7 10*3/uL (ref 4.0–10.5)
nRBC: 0 % (ref 0.0–0.2)

## 2021-03-15 LAB — BASIC METABOLIC PANEL
Anion gap: 8 (ref 5–15)
BUN: 11 mg/dL (ref 6–20)
CO2: 28 mmol/L (ref 22–32)
Calcium: 9.5 mg/dL (ref 8.9–10.3)
Chloride: 100 mmol/L (ref 98–111)
Creatinine, Ser: 0.61 mg/dL (ref 0.44–1.00)
GFR, Estimated: >60 mL/min (ref >60)
Glucose, Bld: 96 mg/dL (ref 70–99)
Potassium: 3.5 mmol/L (ref 3.5–5.1)
Sodium: 136 mmol/L (ref 135–145)

## 2021-03-15 LAB — TROPONIN I (HIGH SENSITIVITY): Troponin I (High Sensitivity): 3 ng/L (ref <18)

## 2021-03-15 LAB — I-STAT BETA HCG BLOOD, ED (MC, WL, AP ONLY): I-stat hCG, quantitative: <5 m[IU]/mL (ref <5)

## 2021-03-15 MED ORDER — DIPHENHYDRAMINE HCL 50 MG/ML IJ SOLN
50.0000 mg | Freq: Once | INTRAMUSCULAR | Status: AC
  Administered 2021-03-15: 50 mg via INTRAVENOUS
  Filled 2021-03-15: qty 1

## 2021-03-15 MED ORDER — KETOROLAC TROMETHAMINE 15 MG/ML IJ SOLN
15.0000 mg | Freq: Once | INTRAMUSCULAR | Status: AC
  Administered 2021-03-15: 15 mg via INTRAVENOUS
  Filled 2021-03-15: qty 1

## 2021-03-15 MED ORDER — METOCLOPRAMIDE HCL 5 MG/ML IJ SOLN
10.0000 mg | Freq: Once | INTRAMUSCULAR | Status: AC
  Administered 2021-03-15: 10 mg via INTRAVENOUS
  Filled 2021-03-15: qty 2

## 2021-03-15 NOTE — ED Provider Notes (Signed)
[facility] EMERGENCY DEPARTMENT Provider Note   CSN: [id number] Arrival date & time: 03/15/21  0252     History Chief Complaint  Patient presents with  . Hypertension    Complaining of high bloodpressure that has been going on for a month, when the blood pressure goes up she gets a headache, takes losartan-hctz and diltiazem. Also she has lower rt side abdominal pain for 3 days    [name] [name] is a 50 y.o. female.  HPI  Patient presents with multiple complaints.  Patient switched blood pressure medicine about 1 month ago.  States that her blood pressure still been high despite starting on losartan with HCTZ and diltiazem.  States that she gets headaches constantly throughout the day which is never happened before.  There is associated nausea but no vomiting.  Patient reports she has episodes of intermittent chest tightness associated with shortness of breath.  These come and go and tend to be highest when she is having a headache and high blood pressure.    She also is complaining of pain to the right upper quadrant that comes and goes.  This has been ongoing since before she changed her medicine.  It is not related to any of the food she eats, she has not tried any alleviating factors.  No history of abdominal surgeries.  Past Medical History:  Diagnosis Date  . Anemia   . Fatty liver   . H. pylori infection   . HTN (hypertension)   . Hx of cardiovascular stress test    a. ETT-Myoview 05/28/12: Low risk study with a small, mild fixed basal inferior perfusion defect most likely representing soft tissue attenuation with inferolateral HK (wall motion abnormality does not correlate with inferior perfusion defect), EF 49%.  Marland Kitchen Hx of echocardiogram    a. Echocardiogram 06/11/12: EF 50-55%, normal wall motion, grade 1 diastolic dysfunction, trivial AI, mild MR, mild LAE, mild RAE.  . RUQ pain     Patient Active Problem List   Diagnosis Date Noted  . Contusion of  right elbow 06/10/2018  . Right elbow pain 06/10/2018  . Lumbar strain, initial encounter 09/18/2017  . Lumbar pain 09/18/2017  . Lung nodule 08/26/2016  . PVC (premature ventricular contraction) 05/26/2014  . Diverticulosis 04/14/2014  . GERD (gastroesophageal reflux disease) 11/19/2012  . Chest pain 05/21/2012  . Obesity, morbid (more than 100 lbs over ideal weight or BMI > 40) ([place]) 05/30/2011  . Essential hypertension 04/29/2009    Past Surgical History:  Procedure Laterality Date  . NO PAST SURGERIES    . UPPER GASTROINTESTINAL ENDOSCOPY       OB History     Gravida  5   Para  4   Term  4   Preterm      AB  1   Living  4      SAB  1   IAB      Ectopic      Multiple      Live Births  4           Family History  Problem Relation Age of Onset  . Coronary artery disease Mother   . Other Brother        fluid around heart  . Other Other        hepatic issues-? maternal side  . Stomach cancer Neg Hx   . Colon cancer Neg Hx   . Esophageal cancer Neg Hx   . Rectal cancer Neg Hx  Social History   Tobacco Use  . Smoking status: Never  . Smokeless tobacco: Never  Vaping Use  . Vaping Use: Never used  Substance Use Topics  . Alcohol use: No  . Drug use: No    Home Medications Prior to Admission medications   Medication Sig Start Date End Date Taking? Authorizing Provider  diclofenac (VOLTAREN) 75 MG EC tablet TAKE 1 TABLET(75 MG) BY MOUTH TWICE DAILY as needed for pain. Take with food. 02/16/20   Jacelyn Pi, Lilia Argue, MD  losartan (COZAAR) 100 MG tablet TAKE 1 TABLET(100 MG) BY MOUTH DAILY 04/06/20   Maximiano Coss, NP    Allergies    Hctz [hydrochlorothiazide]  Review of Systems   Review of Systems  Constitutional:  Negative for fatigue and fever.  Eyes:  Negative for photophobia and visual disturbance.  Respiratory:  Positive for shortness of breath.   Cardiovascular:  Positive for chest pain.  Gastrointestinal:  Positive for  abdominal pain, constipation and nausea. Negative for abdominal distention and vomiting.  Genitourinary:  Negative for dysuria.  Musculoskeletal:  Negative for back pain.  Neurological:  Positive for headaches. Negative for dizziness and syncope.   Physical Exam Updated Vital Signs BP (!) 157/95 (BP Location: Left Arm)   Pulse 67   Temp 97.8 F (36.6 C) (Oral)   Resp 16   Ht '5\' 6"'$  (1.676 m)   Wt 101.6 kg   SpO2 100%   BMI 36.15 kg/m   Physical Exam Vitals and nursing note reviewed. Exam conducted with a chaperone present.  Constitutional:      General: She is not in acute distress.    Appearance: Normal appearance. She is obese.  HENT:     Head: Normocephalic and atraumatic.  Eyes:     General: No scleral icterus.    Extraocular Movements: Extraocular movements intact.     Pupils: Pupils are equal, round, and reactive to light.  Cardiovascular:     Rate and Rhythm: Normal rate and regular rhythm.     Pulses: Normal pulses.  Pulmonary:     Effort: Pulmonary effort is normal. No respiratory distress.     Breath sounds: Normal breath sounds.  Abdominal:     General: Abdomen is flat.     Palpations: Abdomen is soft.     Tenderness: There is abdominal tenderness.     Comments: Mild tenderness along the right upper quadrant, no rigidity or guarding.  No CVA tenderness.    Skin:    Coloration: Skin is not jaundiced.  Neurological:     Mental Status: She is alert. Mental status is at baseline.     Coordination: Coordination normal.     Comments: Cranial nerves III through XII grossly intact.  Grips and equal bilaterally, lower extremities able to raise both bilaterally    ED Results / Procedures / Treatments   Labs (all labs ordered are listed, but only abnormal results are displayed) Labs Reviewed  CBC WITH DIFFERENTIAL/PLATELET - Abnormal; Notable for the following components:      Result Value   Hemoglobin 11.1 (*)    MCH 25.0 (*)    MCHC 29.8 (*)    RDW 16.1 (*)     All other components within normal limits  BASIC METABOLIC PANEL  URINALYSIS, ROUTINE W REFLEX MICROSCOPIC  I-STAT BETA HCG BLOOD, ED ([facility], [facility], [facility] ONLY)    EKG None  Radiology No results found.  Procedures Procedures   Medications Ordered in ED Medications - No data to  display  ED Course  I have reviewed the triage vital signs and the nursing notes.  Pertinent labs & imaging results that were available during my care of the patient were reviewed by me and considered in my medical decision making (see chart for details).  Clinical Course as of 03/15/21 1311  Tue Mar 15, 2021  1222 CBC with Differential(!) No leukocytosis, mild anemia. [HS]  123XX123 Basic metabolic panel No electrolyte derangement, no anion gap, no renal impairment consistent with hypertensive urgency. [HS]  1223 Urinalysis, Routine w reflex microscopic(!) No proteinuria, no signs of UTI. [HS]    Clinical Course User Index [HS] Sherrill Raring, PA-C   MDM Rules/Calculators/A&P                           Patient is mildly hypertensive, otherwise vitals are stable.  His exam is relatively unremarkable, she does have some mild tenderness palpation to the right upper quadrant but negative Murphy sign.  There is no rigidity or guarding, very low suspicion for any acute pathology.  No fever no leukocytosis, doubt cholecystitis.  Pain is also not related to diet in any way.  Does not appear to be biliary colic.  Will work-up patient for any signs of hypertensive urgency.  Thus far UA is without any signs of proteinuria, BMP does not show any signs of renal impairment concerning for hypertensive urgency or emergency.  Blood pressure is mildly elevated, but not grossly everted.  Do not think CT head is warranted although patient has been having headaches intermittently, they have been consistent and not escalating.  No sudden onset consistent with SAH.  No unilateral loss of vision, no focal exam findings.  Overall doubt any  TIA or stroke.  Patient is having chest pain, this has been intermittent since change in blood pressure medicine overall, low suspicion for ACS.  No ST elevation or depression.  Patient in normal sinus rhythm.  However, due to her age and risk factors we will check a single troponin.  If that is negative and not think a second troponin would be helpful especially since the pain has been ongoing for more than 12 hours.  We will treat the headache symptomatically, will check troponin and get a chest x-ray.  Hear score 2.  Initial troponin negative given this is been greater than 12 hours do not think we need delta Trope.  Doubt ACS.  Patient does not have any signs of hypertensive urgency based on blood work and urine.  Do not think CT head is warranted given no focal deficits on neuro exam.  Patient is appropriate for discharge at this time with follow-up with a PCP.  Final Clinical Impression(s) / ED Diagnoses Final diagnoses:  None    Rx / DC Orders ED Discharge Orders     None        Sherrill Raring, [place] 03/15/21 1312    Pattricia Boss, MD 03/15/21 1336

## 2021-03-15 NOTE — Discharge Instructions (Signed)
Continue taking your medicine Schedule follow-up appointment with your primary care doctor next week to discuss changes in your blood pressure medicine as needed. Return if worse

## 2021-03-15 NOTE — ED Provider Notes (Signed)
Emergency Medicine Provider Triage Evaluation Note  [name] [name] , a 50 y.o. female  was evaluated in triage.  Pt complains of HTN x 1 month. High blood pressure associated with a headache. No CP, SOB. Was on Losartan for 15 years, but was changed to Losartan HCTZ and Diltiazem by her PCP due to uncontrolled HTN. States this medication makes her feel bad and hasn't helped her BP. Also with vague RLQ TTP x 3 day with nausea. No fever, vomiting, urinary symptoms. No prior abdominal surgeries. .  Review of Systems  Positive: Headache, abdominal pain, nausea Negative: CP, SOB  Physical Exam  BP (!) 158/127 (BP Location: Left Arm)   Pulse 78   Temp 97.9 F (36.6 C)   Resp 18   Ht '5\' 6"'$  (1.676 m)   Wt 101.6 kg   SpO2 94%   BMI 36.15 kg/m  Gen:   Awake, no distress   Resp:  Normal effort  MSK:   Moves extremities without difficulty  Other:  Lungs CTAB  Medical Decision Making  Medically screening exam initiated at 3:16 AM.  Appropriate orders placed.  Saraih Shrestha was informed that the remainder of the evaluation will be completed by another provider, this initial triage assessment does not replace that evaluation, and the importance of remaining in the ED until their evaluation is complete.  Hypertension not at goal Abdominal pain   Antonietta Breach, PA-C 03/15/21 [id number]    Ripley Fraise, MD 03/15/21 [phone]

## 2021-03-15 NOTE — ED Notes (Signed)
Expressed to pt the need of a urine sample. Pt given urine sample cup.  

## 2021-03-15 NOTE — ED Notes (Signed)
Patient verbalizes understanding of d/c instructions. Opportunities for questions and answers were provided. Pt d/c and ambulated to the lobby.

## 2021-03-15 NOTE — ED Notes (Signed)
Patient transported to X-ray 

## 2021-03-15 NOTE — ED Triage Notes (Signed)
Alert and oriented x 4, states she has a headache, is nausous, and has the lower rt side abdominal pain.

## 2021-04-09 ENCOUNTER — Other Ambulatory Visit: Payer: Self-pay | Admitting: Registered Nurse

## 2021-04-09 DIAGNOSIS — I10 Essential (primary) hypertension: Secondary | ICD-10-CM

## 2021-04-26 NOTE — Progress Notes (Signed)
Patient referred by Maximiano Coss, NP for hypertension  Subjective:   [name] [name], female    DOB: 1970/09/09, 50 y.o.   MRN: [id number]   Chief Complaint  Patient presents with   New Patient (Initial Visit)    Referred by Corene Cornea Reil/Scott Long PA   Hypertensive disorder     HPI  50 y.o. African-American female with hypertension  Patient is originally from [place].  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  She developed hypertension after the birth of her third child, first delivery while in the [place], and has been on medications ever since.  She was on losartan 100 mg daily for long time, which was switched to olmesartan 40 mg daily only 3 weeks ago by her PCP, due to inadequate blood pressure control.  Patient reports that she has significant family history of hypertension in multiple family members.  She has 1 brother who died in his 39s due to some cardiac problem.  Patient is unable to provide details, but mentions that he had "the flu".  Patient denies any significant exertional chest pain or shortness of breath symptoms.  She occasionally has retrosternal chest pain, usually at rest, that lasts only for few seconds.  She does not drink alcohol, drinks caffeinated drink only occasionally.  She does not use any NSAIDs.  Patient cooks traditional food at home, does not use additional salt.  She usually drinks vegetables and organic diet.   Past Medical History:  Diagnosis Date   Anemia    Fatty liver    H. pylori infection    HTN (hypertension)    Hx of cardiovascular stress test    a. ETT-Myoview 05/28/12: Low risk study with a small, mild fixed basal inferior perfusion defect most likely representing soft tissue attenuation with inferolateral HK (wall motion abnormality does not correlate with inferior perfusion defect), EF 49%.   Hx of echocardiogram    a. Echocardiogram 06/11/12: EF 50-55%, normal wall  motion, grade 1 diastolic dysfunction, trivial AI, mild MR, mild LAE, mild RAE.   RUQ pain      Past Surgical History:  Procedure Laterality Date   NO PAST SURGERIES     UPPER GASTROINTESTINAL ENDOSCOPY       Social History   Tobacco Use  Smoking Status Never  Smokeless Tobacco Never    Social History   Substance and Sexual Activity  Alcohol Use No     Family History  Problem Relation Age of Onset   Coronary artery disease Mother    Other Brother        fluid around heart   Other Other        hepatic issues-? maternal side   Stomach cancer Neg Hx    Colon cancer Neg Hx    Esophageal cancer Neg Hx    Rectal cancer Neg Hx      Current Outpatient Medications on File Prior to Visit  Medication Sig Dispense Refill   cholecalciferol (VITAMIN D3) 25 MCG (1000 UNIT) tablet Take 1,000 Units by mouth daily.     vitamin E 1000 UNIT capsule Take 1,000 Units by mouth daily.     olmesartan (BENICAR) 40 MG tablet Take 40 mg by mouth daily.     No current facility-administered medications on file prior to visit.    Cardiovascular and other pertinent studies:  EKG 04/27/2021: Sinus rhythm 71 bpm Low voltage in precordial  leads Poor R-wave progression   Echocardiogram 2018: - Left ventricle: The cavity size was normal. Wall thickness was    normal. Systolic function was normal. The estimated ejection    fraction was in the range of 50% to 55%. Wall motion was normal;    there were no regional wall motion abnormalities. Doppler    parameters are consistent with abnormal left ventricular    relaxation (grade 1 diastolic dysfunction). Doppler parameters    are consistent with high ventricular filling pressure.  - Ascending aorta: The ascending aorta was mildly dilated.  - Mitral valve: There was mild regurgitation.  - Left atrium: The atrium was mildly dilated.   Impressions:   - Normal LV systolic function; mild diastolic dysfunction; elevated    LV filling  pressure; mildly dilated ascending aorta; mild MR;    mild LAE.   Stress test 2018: There was no ST segment deviation noted during stress. This is a low risk study. The study is normal.   Normal Lexiscan nuclear stress test with no evidence for prior infarct or ischemia. LVEF not evaluated.   Recent labs: 04/11/2021: Glucose 83, BUN/Cr 11/0.62. EGFR 108. Na/K 140/4.1. Rest of the CMP normal H/H 11.1/36.2. MCV 83.6. Platelets 186 HbA1C 5.1% Chol 210, TG 43, HDL 101, LDL 95 TSH 1.8 normal   Review of Systems  Cardiovascular:  Negative for chest pain, dyspnea on exertion, leg swelling, palpitations and syncope.        Vitals:   04/27/21 0948 04/27/21 0957  BP: (!) 194/116 (!) 182/103  Pulse: 75 71  Resp: 16   Temp: 98 F (36.7 C)   SpO2: 100%      Body mass index is 36.64 kg/m. Filed Weights   04/27/21 0948  Weight: 227 lb (103 kg)     Objective:   Physical Exam Vitals and nursing note reviewed.  Constitutional:      General: She is not in acute distress. Neck:     Vascular: No JVD.  Cardiovascular:     Rate and Rhythm: Normal rate and regular rhythm.     Heart sounds: Normal heart sounds. No murmur heard. Pulmonary:     Effort: Pulmonary effort is normal.     Breath sounds: Normal breath sounds. No wheezing or rales.  Musculoskeletal:     Right lower leg: No edema.     Left lower leg: No edema.        Assessment & Recommendations:    50 y.o. 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  I will obtain echocardiogram, renal artery duplex, and renin/aldosterone. Change olmesartan 40 mg to olmesartan-HCTZ 40-25 mg, and had amlodipine 5 mg daily. Patient is reportedly has had edema with amlodipine in the past.  However, with concurrent use of diuretic, had to sedate that he did not have any obvious significant issues. Encouraged regular walking.  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 I  will inquire about any snoring and possibility of obstructive sleep apnea at next visit.  Further recommendations after above testing.  Thank you for referring the patient to [place]. Please feel free to contact with any questions.   Nigel Mormon, MD Pager: [phone] Office: [phone]

## 2021-04-27 ENCOUNTER — Other Ambulatory Visit: Payer: Self-pay

## 2021-04-27 ENCOUNTER — Ambulatory Visit: Payer: 59 | Admitting: Cardiology

## 2021-04-27 ENCOUNTER — Encounter: Payer: Self-pay | Admitting: Cardiology

## 2021-04-27 VITALS — BP 182/103 | HR 71 | Temp 98.0°F | Resp 16 | Ht 66.0 in | Wt 227.0 lb

## 2021-04-27 DIAGNOSIS — I10 Essential (primary) hypertension: Secondary | ICD-10-CM

## 2021-04-27 MED ORDER — AMLODIPINE BESYLATE 5 MG PO TABS: 5.0000 mg | ORAL_TABLET | Freq: Every day | ORAL | 3 refills | Status: DC

## 2021-04-27 MED ORDER — OLMESARTAN MEDOXOMIL-HCTZ 40-25 MG PO TABS: 1.0000 | ORAL_TABLET | Freq: Every day | ORAL | 3 refills | Status: DC

## 2021-05-04 ENCOUNTER — Other Ambulatory Visit: Payer: Self-pay

## 2021-05-04 ENCOUNTER — Ambulatory Visit: Payer: 59

## 2021-05-04 DIAGNOSIS — I10 Essential (primary) hypertension: Secondary | ICD-10-CM

## 2021-05-04 LAB — ALDOSTERONE + RENIN ACTIVITY W/ RATIO
ALDOS/RENIN RATIO: 25.8 (ref 0.0–30.0)
ALDOSTERONE: 13.7 ng/dL (ref 0.0–30.0)
Renin: 0.53 ng/mL/hr (ref 0.167–5.380)

## 2021-05-25 ENCOUNTER — Encounter: Payer: Self-pay | Admitting: Cardiology

## 2021-05-25 ENCOUNTER — Ambulatory Visit: Payer: 59 | Admitting: Cardiology

## 2021-05-25 ENCOUNTER — Other Ambulatory Visit: Payer: Self-pay

## 2021-05-25 VITALS — BP 141/96 | HR 78 | Temp 98.7°F | Resp 16 | Ht 66.0 in | Wt 225.6 lb

## 2021-05-25 DIAGNOSIS — I10 Essential (primary) hypertension: Secondary | ICD-10-CM

## 2021-05-25 DIAGNOSIS — G4733 Obstructive sleep apnea (adult) (pediatric): Secondary | ICD-10-CM

## 2021-05-25 NOTE — Progress Notes (Signed)
Patient referred by Maximiano Coss, NP for hypertension  Subjective:   [name] [name], female    DOB: [birth year], 50 y.o.   MRN: [id number]   Chief Complaint  Patient presents with   Hypertension   Follow-up    4-6 weeks     HPI  50 y.o. African-American female with hypertension, OSA  Blood pressure is much better controlled since last visit.  Patient tells me that she was diagnosed with obstructive sleep apnea, but is awaiting her CPAP.  Recent test results discussed with the patient, details below.  Initial consultation visit: Patient is originally from [place].  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  She developed hypertension after the birth of her third child, first delivery while in the [place], and has been on medications ever since.  She was on losartan 100 mg daily for long time, which was switched to olmesartan 40 mg daily only 3 weeks ago by her PCP, due to inadequate blood pressure control.  Patient reports that she has significant family history of hypertension in multiple family members.  She has 1 brother who died in his 55s due to some cardiac problem.  Patient is unable to provide details, but mentions that he had "the flu".  Patient denies any significant exertional chest pain or shortness of breath symptoms.  She occasionally has retrosternal chest pain, usually at rest, that lasts only for few seconds.  She does not drink alcohol, drinks caffeinated drink only occasionally.  She does not use any NSAIDs.  Patient cooks traditional food at home, does not use additional salt.  She usually drinks vegetables and organic diet.   Current Outpatient Medications on File Prior to Visit  Medication Sig Dispense Refill   amLODipine (NORVASC) 5 MG tablet Take 1 tablet (5 mg total) by mouth daily. 30 tablet 3   cholecalciferol (VITAMIN D3) 25 MCG (1000 UNIT) tablet Take 1,000 Units by mouth daily.      olmesartan-hydrochlorothiazide (BENICAR HCT) 40-25 MG tablet Take 1 tablet by mouth daily. 30 tablet 3   vitamin E 1000 UNIT capsule Take 1,000 Units by mouth daily.     No current facility-administered medications on file prior to visit.    Cardiovascular and other pertinent studies:   Echocardiogram 05/04/2021:  Left ventricle cavity is normal in size. Moderate concentric hypertrophy  of the left ventricle. Normal global wall motion. Normal LV systolic  function with visual EF 50-55%. Doppler evidence of grade I (impaired)  diastolic dysfunction, normal LAP.  Left atrial cavity is moderately dilated at 4 cm. Aneurysmal interatrial  septum without 2D or color Doppler evidence of shunting.  Mild (Grade I) mitral regurgitation.  Mild tricuspid regurgitation. Estimated pulmonary artery systolic pressure  24 mmHg.  Renal artery duplex  05/04/2021:  No evidence of renal artery occlusive disease in either renal artery.  Normal intrarenal vascular perfusion is noted in both kidneys.  Renal length is within normal limits for both kidneys.  No plaque observed in the abdominal aorta. Normal abdominal aorta flow  velocities noted.  EKG 04/27/2021: Sinus rhythm 71 bpm Low voltage in precordial leads Poor R-wave progression  Stress test 2018: There was no ST segment deviation noted during stress. This is a low risk study. The study is normal.   Normal Lexiscan nuclear stress test with no evidence for prior infarct or ischemia. LVEF not evaluated.   Recent labs: 04/11/2021: Glucose 83,  BUN/Cr 11/0.62. EGFR 108. Na/K 140/4.1. Rest of the CMP normal H/H 11.1/36.2. MCV 83.6. Platelets 186 HbA1C 5.1% Chol 210, TG 43, HDL 101, LDL 95 TSH 1.8 normal   Latest Reference Range & Units 04/28/21 09:42  ALDOSTERONE 0.0 - 30.0 ng/dL 13.7  Renin 0.167 - 5.380 ng/mL/hr 0.530  ALDOS/RENIN RATIO 0.0 - 30.0  25.8    Review of Systems  Cardiovascular:  Negative for chest pain, dyspnea on  exertion, leg swelling, palpitations and syncope.        Vitals:   05/25/21 1123  BP: (!) 141/96  Pulse: 78  Resp: 16  Temp: 98.7 F (37.1 C)  SpO2: 97%     Body mass index is 36.64 kg/m. Filed Weights   05/25/21 1123  Weight: 225 lb 9.6 oz (102.3 kg)      Objective:   Physical Exam Vitals and nursing note reviewed.  Constitutional:      General: She is not in acute distress. Neck:     Vascular: No JVD.  Cardiovascular:     Rate and Rhythm: Normal rate and regular rhythm.     Heart sounds: Normal heart sounds. No murmur heard. Pulmonary:     Effort: Pulmonary effort is normal.     Breath sounds: Normal breath sounds. No wheezing or rales.  Musculoskeletal:     Right lower leg: No edema.     Left lower leg: No edema.        Assessment & Recommendations:   50 y.o. 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  No secondary cause identified. Much improved on olmesartan-HCTZ 40-25 mg, amlodipine 5 mg daily. I am optimistic that OSA use will further improve her blood pressure.  F/u in 6 months     Nigel Mormon, MD Pager: [phone] Office: [phone]

## 2021-07-16 ENCOUNTER — Other Ambulatory Visit: Payer: Self-pay | Admitting: Cardiology

## 2021-07-16 DIAGNOSIS — I10 Essential (primary) hypertension: Secondary | ICD-10-CM

## 2021-08-18 ENCOUNTER — Other Ambulatory Visit: Payer: Self-pay | Admitting: Cardiology

## 2021-08-18 DIAGNOSIS — I10 Essential (primary) hypertension: Secondary | ICD-10-CM

## 2021-11-22 NOTE — Progress Notes (Unsigned)
Patient referred by No ref. provider found for hypertension  Subjective:   [name] [name], female    DOB: [birth year], 51 y.o.   MRN: [id number]   No chief complaint on file.    HPI  51 y.o. African-American female with hypertension, OSA  ***Blood pressure is much better controlled since last visit.  Patient tells me that she was diagnosed with obstructive sleep apnea, but is awaiting her CPAP.  Recent test results discussed with the patient, details below.  Initial consultation visit: Patient is originally from [place].  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  She developed hypertension after the birth of her third child, first delivery while in the [place], and has been on medications ever since.  She was on losartan 100 mg daily for long time, which was switched to olmesartan 40 mg daily only 3 weeks ago by her PCP, due to inadequate blood pressure control.  Patient reports that she has significant family history of hypertension in multiple family members.  She has 1 brother who died in his 10s due to some cardiac problem.  Patient is unable to provide details, but mentions that he had "the flu".  Patient denies any significant exertional chest pain or shortness of breath symptoms.  She occasionally has retrosternal chest pain, usually at rest, that lasts only for few seconds.  She does not drink alcohol, drinks caffeinated drink only occasionally.  She does not use any NSAIDs.  Patient cooks traditional food at home, does not use additional salt.  She usually drinks vegetables and organic diet.   Current Outpatient Medications on File Prior to Visit  Medication Sig Dispense Refill   amLODipine (NORVASC) 5 MG tablet TAKE 1 TABLET(5 MG) BY MOUTH DAILY 30 tablet 3   cholecalciferol (VITAMIN D3) 25 MCG (1000 UNIT) tablet Take 1,000 Units by mouth daily.     olmesartan-hydrochlorothiazide (BENICAR HCT) 40-25 MG tablet  TAKE 1 TABLET BY MOUTH DAILY 90 tablet 3   vitamin E 1000 UNIT capsule Take 1,000 Units by mouth daily.     No current facility-administered medications on file prior to visit.    Cardiovascular and other pertinent studies:   Echocardiogram 05/04/2021:  Left ventricle cavity is normal in size. Moderate concentric hypertrophy  of the left ventricle. Normal global wall motion. Normal LV systolic  function with visual EF 50-55%. Doppler evidence of grade I (impaired)  diastolic dysfunction, normal LAP.  Left atrial cavity is moderately dilated at 4 cm. Aneurysmal interatrial  septum without 2D or color Doppler evidence of shunting.  Mild (Grade I) mitral regurgitation.  Mild tricuspid regurgitation. Estimated pulmonary artery systolic pressure  24 mmHg.  Renal artery duplex  05/04/2021:  No evidence of renal artery occlusive disease in either renal artery.  Normal intrarenal vascular perfusion is noted in both kidneys.  Renal length is within normal limits for both kidneys.  No plaque observed in the abdominal aorta. Normal abdominal aorta flow  velocities noted.  EKG 04/27/2021: Sinus rhythm 71 bpm Low voltage in precordial leads Poor R-wave progression  Stress test 2018: There was no ST segment deviation noted during stress. This is a low risk study. The study is normal.   Normal Lexiscan nuclear stress test with no evidence for prior infarct or ischemia. LVEF not evaluated.   Recent labs: 04/11/2021: Glucose 83, BUN/Cr 11/0.62. EGFR 108. Na/K 140/4.1. Rest of the CMP normal H/H 11.1/36.2. MCV  83.6. Platelets 186 HbA1C 5.1% Chol 210, TG 43, HDL 101, LDL 95 TSH 1.8 normal   Latest Reference Range & Units 04/28/21 09:42  ALDOSTERONE 0.0 - 30.0 ng/dL 13.7  Renin 0.167 - 5.380 ng/mL/hr 0.530  ALDOS/RENIN RATIO 0.0 - 30.0  25.8    Review of Systems  Cardiovascular:  Negative for chest pain, dyspnea on exertion, leg swelling, palpitations and syncope.        There  were no vitals filed for this visit.    There is no height or weight on file to calculate BMI. There were no vitals filed for this visit.     Objective:   Physical Exam Vitals and nursing note reviewed.  Constitutional:      General: She is not in acute distress. Neck:     Vascular: No JVD.  Cardiovascular:     Rate and Rhythm: Normal rate and regular rhythm.     Heart sounds: Normal heart sounds. No murmur heard. Pulmonary:     Effort: Pulmonary effort is normal.     Breath sounds: Normal breath sounds. No wheezing or rales.  Musculoskeletal:     Right lower leg: No edema.     Left lower leg: No edema.        Assessment & Recommendations:   51 y.o. 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  No secondary cause identified. Much improved on olmesartan-HCTZ 40-25 mg, amlodipine 5 mg daily. I am optimistic that OSA use will further improve her blood pressure.  ***F/u in 6 months     Nigel Mormon, MD Pager: [phone] Office: [phone]

## 2021-11-23 ENCOUNTER — Ambulatory Visit: Payer: No Typology Code available for payment source | Admitting: Cardiology

## 2021-11-23 ENCOUNTER — Encounter: Payer: Self-pay | Admitting: Cardiology

## 2021-11-23 VITALS — BP 153/99 | HR 68 | Temp 98.0°F | Resp 16 | Ht 66.0 in | Wt 230.0 lb

## 2021-11-23 DIAGNOSIS — I10 Essential (primary) hypertension: Secondary | ICD-10-CM

## 2021-12-28 ENCOUNTER — Other Ambulatory Visit: Payer: Self-pay | Admitting: Obstetrics and Gynecology

## 2021-12-31 ENCOUNTER — Other Ambulatory Visit: Payer: Self-pay | Admitting: Cardiology

## 2022-01-02 ENCOUNTER — Other Ambulatory Visit: Payer: Self-pay | Admitting: Cardiology

## 2022-01-06 ENCOUNTER — Other Ambulatory Visit: Payer: Self-pay | Admitting: Obstetrics and Gynecology

## 2022-01-06 DIAGNOSIS — N644 Mastodynia: Secondary | ICD-10-CM

## 2022-01-12 ENCOUNTER — Encounter: Payer: Self-pay | Admitting: Cardiology

## 2022-01-12 ENCOUNTER — Ambulatory Visit: Payer: No Typology Code available for payment source | Admitting: Cardiology

## 2022-01-12 VITALS — BP 128/90 | HR 96 | Temp 98.0°F | Resp 17 | Ht 66.0 in | Wt 232.2 lb

## 2022-01-12 DIAGNOSIS — I10 Essential (primary) hypertension: Secondary | ICD-10-CM

## 2022-01-12 DIAGNOSIS — G4733 Obstructive sleep apnea (adult) (pediatric): Secondary | ICD-10-CM

## 2022-01-12 MED ORDER — OLMESARTAN MEDOXOMIL-HCTZ 40-25 MG PO TABS: 1.0000 | ORAL_TABLET | Freq: Every day | ORAL | 3 refills | Status: DC

## 2022-01-12 MED ORDER — AMLODIPINE BESYLATE 5 MG PO TABS: 5.0000 mg | ORAL_TABLET | Freq: Every day | ORAL | 3 refills | Status: DC

## 2022-01-12 NOTE — Progress Notes (Signed)
Patient referred by No ref. provider found for hypertension  Subjective:   [name] [name], female    DOB: [birth year], 51 y.o.   MRN: [id number]   Chief Complaint  Patient presents with   Hypertension   Follow-up    4-6 weeks    HPI  50 y.o. African-American female with hypertension, OSA  Blood pressure elevated on home monitor but consistently well controlled now, as checked during office visits.  Initial consultation visit: Patient is originally from [place].  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  She developed hypertension after the birth of her third child, first delivery while in the [place], and has been on medications ever since.  She was on losartan 100 mg daily for long time, which was switched to olmesartan 40 mg daily only 3 weeks ago by her PCP, due to inadequate blood pressure control.  Patient reports that she has significant family history of hypertension in multiple family members.  She has 1 brother who died in his 9s due to some cardiac problem.  Patient is unable to provide details, but mentions that he had "the flu".  Patient denies any significant exertional chest pain or shortness of breath symptoms.  She occasionally has retrosternal chest pain, usually at rest, that lasts only for few seconds.  She does not drink alcohol, drinks caffeinated drink only occasionally.  She does not use any NSAIDs.  Patient cooks traditional food at home, does not use additional salt.  She usually drinks vegetables and organic diet.    Current Outpatient Medications:    amLODipine (NORVASC) 5 MG tablet, TAKE 1 TABLET(5 MG) BY MOUTH DAILY, Disp: 90 tablet, Rfl: 0   cholecalciferol (VITAMIN D3) 25 MCG (1000 UNIT) tablet, Take 1,000 Units by mouth daily., Disp: , Rfl:    olmesartan-hydrochlorothiazide (BENICAR HCT) 40-25 MG tablet, TAKE 1 TABLET BY MOUTH DAILY, Disp: 90 tablet, Rfl: 3    Cardiovascular and other  pertinent studies:  EKG 11/23/2021: Sinus rhythm 66 bpm Nonspecific T-abnormality Low voltage  Echocardiogram 05/04/2021:  Left ventricle cavity is normal in size. Moderate concentric hypertrophy  of the left ventricle. Normal global wall motion. Normal LV systolic  function with visual EF 50-55%. Doppler evidence of grade I (impaired)  diastolic dysfunction, normal LAP.  Left atrial cavity is moderately dilated at 4 cm. Aneurysmal interatrial  septum without 2D or color Doppler evidence of shunting.  Mild (Grade I) mitral regurgitation.  Mild tricuspid regurgitation. Estimated pulmonary artery systolic pressure  24 mmHg.  Renal artery duplex  05/04/2021:  No evidence of renal artery occlusive disease in either renal artery.  Normal intrarenal vascular perfusion is noted in both kidneys.  Renal length is within normal limits for both kidneys.  No plaque observed in the abdominal aorta. Normal abdominal aorta flow  velocities noted.  EKG 04/27/2021: Sinus rhythm 71 bpm Low voltage in precordial leads Poor R-wave progression  Stress test 2018: There was no ST segment deviation noted during stress. This is a low risk study. The study is normal.   Normal Lexiscan nuclear stress test with no evidence for prior infarct or ischemia. LVEF not evaluated.   Recent labs: 04/11/2021: Glucose 83, BUN/Cr 11/0.62. EGFR 108. Na/K 140/4.1. Rest of the CMP normal H/H 11.1/36.2. MCV 83.6. Platelets 186 HbA1C 5.1% Chol 210, TG 43, HDL 101, LDL 95 TSH 1.8 normal   Latest Reference Range & Units 04/28/21 09:42  ALDOSTERONE 0.0 - 30.0 ng/dL 13.7  Renin 0.167 - 5.380 ng/mL/hr 0.530  ALDOS/RENIN RATIO 0.0 - 30.0  25.8    Review of Systems  Cardiovascular:  Negative for chest pain, dyspnea on exertion, leg swelling, palpitations and syncope.         Vitals:   01/12/22 1008  BP: 128/90  Pulse: 96  Resp: 17  Temp: 98 F (36.7 C)  SpO2: 98%     Body mass index is 37.48  kg/m. Filed Weights   01/12/22 1008  Weight: 232 lb 3.2 oz (105.3 kg)      Objective:   Physical Exam Vitals and nursing note reviewed.  Constitutional:      General: She is not in acute distress. Neck:     Vascular: No JVD.  Cardiovascular:     Rate and Rhythm: Normal rate and regular rhythm.     Heart sounds: Normal heart sounds. No murmur heard. Pulmonary:     Effort: Pulmonary effort is normal.     Breath sounds: Normal breath sounds. No wheezing or rales.  Musculoskeletal:     Right lower leg: No edema.     Left lower leg: No edema.         Assessment & Recommendations:   51 y.o. 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  No secondary cause identified. Well controlled on olmesartan-HCTZ 40-25 mg, amlodipine 5 mg daily. She may need to recalibrate er home monitor. Check labs today.  F/u in 6 months At that time, if she has established care with a PCP, I will see her as needed.     Nigel Mormon, MD Pager: [phone] Office: [phone]

## 2022-02-03 ENCOUNTER — Ambulatory Visit: Payer: 59

## 2022-02-03 ENCOUNTER — Other Ambulatory Visit: Payer: Self-pay | Admitting: Obstetrics and Gynecology

## 2022-02-03 ENCOUNTER — Ambulatory Visit
Admission: RE | Admit: 2022-02-03 | Discharge: 2022-02-03 | Disposition: A | Discharge status: Home / Self Care | Payer: No Typology Code available for payment source | Source: Ambulatory Visit | Attending: Obstetrics and Gynecology | Admitting: Obstetrics and Gynecology

## 2022-02-03 DIAGNOSIS — N644 Mastodynia: Secondary | ICD-10-CM

## 2022-03-30 ENCOUNTER — Telehealth: Payer: Self-pay

## 2022-04-03 NOTE — Telephone Encounter (Signed)
Called and spoke to patient she stated her cough has gotten better but still there and her bp is still elevated. Today it was 150/90 I asked patient reading is the same like last week she stated that actually last week it was 160/90 and one day 160/100 but she cannot recall the days. Patient stated she was going to call back once she knew what day she is free to make an appointment with you.

## 2022-04-03 NOTE — Telephone Encounter (Signed)
She has been on the same medication for a while. I do not think cough is because of thi medication.  How is the cough and blood pressure now?  Thanks MJP

## 2022-06-08 ENCOUNTER — Other Ambulatory Visit: Payer: Self-pay

## 2022-06-08 ENCOUNTER — Observation Stay (HOSPITAL_COMMUNITY)
Admission: RE | Admit: 2022-06-08 | Discharge: 2022-06-09 | Disposition: A | Discharge status: Home / Self Care | Payer: No Typology Code available for payment source | Source: Other Acute Inpatient Hospital | Attending: Cardiology | Admitting: Cardiology

## 2022-06-08 ENCOUNTER — Encounter (HOSPITAL_COMMUNITY): Payer: Self-pay | Admitting: Cardiology

## 2022-06-08 ENCOUNTER — Encounter (HOSPITAL_COMMUNITY): Admission: RE | Disposition: A | Discharge status: Home / Self Care | Payer: Self-pay | Attending: Cardiology

## 2022-06-08 ENCOUNTER — Ambulatory Visit: Payer: No Typology Code available for payment source | Admitting: Cardiology

## 2022-06-08 ENCOUNTER — Encounter: Payer: Self-pay | Admitting: Cardiology

## 2022-06-08 VITALS — BP 141/93 | HR 90 | Ht 66.0 in | Wt 232.0 lb

## 2022-06-08 DIAGNOSIS — I10 Essential (primary) hypertension: Secondary | ICD-10-CM

## 2022-06-08 DIAGNOSIS — I25110 Atherosclerotic heart disease of native coronary artery with unstable angina pectoris: Secondary | ICD-10-CM | POA: Diagnosis not present

## 2022-06-08 DIAGNOSIS — Z6837 Body mass index (BMI) 37.0-37.9, adult: Secondary | ICD-10-CM | POA: Diagnosis not present

## 2022-06-08 DIAGNOSIS — I200 Unstable angina: Secondary | ICD-10-CM

## 2022-06-08 DIAGNOSIS — R072 Precordial pain: Secondary | ICD-10-CM | POA: Insufficient documentation

## 2022-06-08 DIAGNOSIS — E7800 Pure hypercholesterolemia, unspecified: Secondary | ICD-10-CM | POA: Insufficient documentation

## 2022-06-08 DIAGNOSIS — Z79899 Other long term (current) drug therapy: Secondary | ICD-10-CM | POA: Insufficient documentation

## 2022-06-08 DIAGNOSIS — I259 Chronic ischemic heart disease, unspecified: Secondary | ICD-10-CM

## 2022-06-08 DIAGNOSIS — E669 Obesity, unspecified: Secondary | ICD-10-CM | POA: Insufficient documentation

## 2022-06-08 DIAGNOSIS — R079 Chest pain, unspecified: Secondary | ICD-10-CM

## 2022-06-08 DIAGNOSIS — R9431 Abnormal electrocardiogram [ECG] [EKG]: Secondary | ICD-10-CM

## 2022-06-08 HISTORY — PX: LEFT HEART CATH AND CORONARY ANGIOGRAPHY: CATH118249

## 2022-06-08 LAB — BASIC METABOLIC PANEL
Anion gap: 8 (ref 5–15)
BUN: 10 mg/dL (ref 6–20)
CO2: 28 mmol/L (ref 22–32)
Calcium: 10 mg/dL (ref 8.9–10.3)
Chloride: 103 mmol/L (ref 98–111)
Creatinine, Ser: 0.72 mg/dL (ref 0.44–1.00)
GFR, Estimated: >60 mL/min (ref >60)
Glucose, Bld: 78 mg/dL (ref 70–99)
Potassium: 3.1 mmol/L — ABNORMAL LOW (ref 3.5–5.1)
Sodium: 139 mmol/L (ref 135–145)

## 2022-06-08 LAB — LIPID PANEL
Cholesterol: 199 mg/dL (ref 0–200)
HDL: 84 mg/dL (ref >40)
LDL Cholesterol: 106 mg/dL — ABNORMAL HIGH (ref 0–99)
Total CHOL/HDL Ratio: 2.4 RATIO
Triglycerides: 43 mg/dL (ref <150)
VLDL: 9 mg/dL (ref 0–40)

## 2022-06-08 LAB — CBC
HCT: 39.2 % (ref 36.0–46.0)
Hemoglobin: 13.2 g/dL (ref 12.0–15.0)
MCH: 31.5 pg (ref 26.0–34.0)
MCHC: 33.7 g/dL (ref 30.0–36.0)
MCV: 93.6 fL (ref 80.0–100.0)
Platelets: 199 10*3/uL (ref 150–400)
RBC: 4.19 MIL/uL (ref 3.87–5.11)
RDW: 11.6 % (ref 11.5–15.5)
WBC: 3.9 10*3/uL — ABNORMAL LOW (ref 4.0–10.5)
nRBC: 0 % (ref 0.0–0.2)

## 2022-06-08 LAB — TROPONIN I (HIGH SENSITIVITY): Troponin I (High Sensitivity): 10 ng/L (ref <18)

## 2022-06-08 SURGERY — LEFT HEART CATH AND CORONARY ANGIOGRAPHY
Anesthesia: LOCAL

## 2022-06-08 MED ORDER — ISOSORBIDE MONONITRATE ER 30 MG PO TB24
30.0000 mg | ORAL_TABLET | Freq: Every day | ORAL | Status: DC
  Administered 2022-06-08 – 2022-06-09 (×2): 30 mg via ORAL
  Filled 2022-06-08 (×2): qty 1

## 2022-06-08 MED ORDER — ASPIRIN 81 MG PO CHEW: 81.0000 mg | CHEWABLE_TABLET | Freq: Every day | ORAL | Status: DC

## 2022-06-08 MED ORDER — NITROGLYCERIN 2 % TD OINT
1.0000 [in_us] | TOPICAL_OINTMENT | Freq: Once | TRANSDERMAL | Status: AC
  Administered 2022-06-08: 1 [in_us] via TOPICAL
  Filled 2022-06-08: qty 30

## 2022-06-08 MED ORDER — ASPIRIN 81 MG PO CHEW
81.0000 mg | CHEWABLE_TABLET | ORAL | Status: AC
  Administered 2022-06-08: 81 mg via ORAL
  Filled 2022-06-08: qty 1

## 2022-06-08 MED ORDER — ROSUVASTATIN CALCIUM 20 MG PO TABS
20.0000 mg | ORAL_TABLET | Freq: Every day | ORAL | Status: DC
  Administered 2022-06-09: 20 mg via ORAL
  Filled 2022-06-08: qty 1

## 2022-06-08 MED ORDER — HYDROCHLOROTHIAZIDE 25 MG PO TABS
25.0000 mg | ORAL_TABLET | Freq: Every day | ORAL | Status: DC
  Administered 2022-06-08 – 2022-06-09 (×2): 25 mg via ORAL
  Filled 2022-06-08 (×2): qty 1

## 2022-06-08 MED ORDER — MIDAZOLAM HCL 2 MG/2ML IJ SOLN
INTRAMUSCULAR | Status: AC
  Filled 2022-06-08: qty 2

## 2022-06-08 MED ORDER — SODIUM CHLORIDE 0.9 % IV SOLN: 250.0000 mL | INTRAVENOUS | Status: DC | PRN

## 2022-06-08 MED ORDER — ROSUVASTATIN CALCIUM 5 MG PO TABS: 10.0000 mg | ORAL_TABLET | Freq: Every day | ORAL | Status: DC

## 2022-06-08 MED ORDER — LIDOCAINE HCL (PF) 1 % IJ SOLN
INTRAMUSCULAR | Status: AC
  Filled 2022-06-08: qty 30

## 2022-06-08 MED ORDER — AMLODIPINE BESYLATE 5 MG PO TABS
5.0000 mg | ORAL_TABLET | Freq: Every day | ORAL | Status: DC
  Administered 2022-06-08 – 2022-06-09 (×2): 5 mg via ORAL
  Filled 2022-06-08 (×2): qty 1

## 2022-06-08 MED ORDER — ACETAMINOPHEN 325 MG PO TABS
650.0000 mg | ORAL_TABLET | ORAL | Status: DC | PRN
  Administered 2022-06-09: 650 mg via ORAL
  Filled 2022-06-08: qty 2

## 2022-06-08 MED ORDER — SODIUM CHLORIDE 0.9% FLUSH: 3.0000 mL | INTRAVENOUS | Status: DC | PRN

## 2022-06-08 MED ORDER — HEPARIN SODIUM (PORCINE) 1000 UNIT/ML IJ SOLN
INTRAMUSCULAR | Status: DC | PRN
  Administered 2022-06-08: 5000 [IU] via INTRAVENOUS

## 2022-06-08 MED ORDER — VERAPAMIL HCL 2.5 MG/ML IV SOLN
INTRAVENOUS | Status: AC
  Filled 2022-06-08: qty 2

## 2022-06-08 MED ORDER — FENTANYL CITRATE (PF) 100 MCG/2ML IJ SOLN
INTRAMUSCULAR | Status: AC
  Filled 2022-06-08: qty 2

## 2022-06-08 MED ORDER — HEPARIN (PORCINE) IN NACL 1000-0.9 UT/500ML-% IV SOLN
INTRAVENOUS | Status: DC | PRN
  Administered 2022-06-08 (×2): 500 mL

## 2022-06-08 MED ORDER — HEPARIN SODIUM (PORCINE) 1000 UNIT/ML IJ SOLN
INTRAMUSCULAR | Status: AC
  Filled 2022-06-08: qty 10

## 2022-06-08 MED ORDER — SODIUM CHLORIDE 0.9 % WEIGHT BASED INFUSION
3.0000 mL/kg/h | INTRAVENOUS | Status: DC
  Administered 2022-06-08: 3 mL/kg/h via INTRAVENOUS

## 2022-06-08 MED ORDER — ONDANSETRON HCL 4 MG/2ML IJ SOLN: 4.0000 mg | Freq: Four times a day (QID) | INTRAMUSCULAR | Status: DC | PRN

## 2022-06-08 MED ORDER — OLMESARTAN MEDOXOMIL-HCTZ 40-25 MG PO TABS: 1.0000 | ORAL_TABLET | Freq: Every day | ORAL | Status: DC

## 2022-06-08 MED ORDER — CLOPIDOGREL BISULFATE 75 MG PO TABS
300.0000 mg | ORAL_TABLET | Freq: Once | ORAL | Status: AC
  Administered 2022-06-08: 300 mg via ORAL
  Filled 2022-06-08: qty 4

## 2022-06-08 MED ORDER — SODIUM CHLORIDE 0.9 % WEIGHT BASED INFUSION: 1.0000 mL/kg/h | INTRAVENOUS | Status: DC

## 2022-06-08 MED ORDER — IOHEXOL 350 MG/ML SOLN
INTRAVENOUS | Status: DC | PRN
  Administered 2022-06-08: 70 mL

## 2022-06-08 MED ORDER — HEPARIN (PORCINE) IN NACL 1000-0.9 UT/500ML-% IV SOLN
INTRAVENOUS | Status: AC
  Filled 2022-06-08: qty 1000

## 2022-06-08 MED ORDER — ROSUVASTATIN CALCIUM 10 MG PO TABS: 10.0000 mg | ORAL_TABLET | Freq: Every day | ORAL | 11 refills | Status: DC

## 2022-06-08 MED ORDER — HYDRALAZINE HCL 20 MG/ML IJ SOLN: 10.0000 mg | INTRAMUSCULAR | Status: AC | PRN

## 2022-06-08 MED ORDER — PANTOPRAZOLE SODIUM 40 MG PO TBEC: 40.0000 mg | DELAYED_RELEASE_TABLET | Freq: Every day | ORAL | 0 refills | Status: DC

## 2022-06-08 MED ORDER — LABETALOL HCL 5 MG/ML IV SOLN: 10.0000 mg | INTRAVENOUS | Status: AC | PRN

## 2022-06-08 MED ORDER — CLOPIDOGREL BISULFATE 75 MG PO TABS
75.0000 mg | ORAL_TABLET | Freq: Every day | ORAL | Status: DC
  Administered 2022-06-09: 75 mg via ORAL
  Filled 2022-06-08: qty 1

## 2022-06-08 MED ORDER — IRBESARTAN 150 MG PO TABS
300.0000 mg | ORAL_TABLET | Freq: Every day | ORAL | Status: DC
  Administered 2022-06-08 – 2022-06-09 (×2): 300 mg via ORAL
  Filled 2022-06-08 (×2): qty 2

## 2022-06-08 MED ORDER — MIDAZOLAM HCL 2 MG/2ML IJ SOLN
INTRAMUSCULAR | Status: DC | PRN
  Administered 2022-06-08: 2 mg via INTRAVENOUS

## 2022-06-08 MED ORDER — VERAPAMIL HCL 2.5 MG/ML IV SOLN
INTRAVENOUS | Status: DC | PRN
  Administered 2022-06-08: 10 mL via INTRA_ARTERIAL

## 2022-06-08 MED ORDER — SODIUM CHLORIDE 0.9% FLUSH
3.0000 mL | Freq: Two times a day (BID) | INTRAVENOUS | Status: DC
  Administered 2022-06-09: 3 mL via INTRAVENOUS

## 2022-06-08 MED ORDER — SODIUM CHLORIDE 0.9% FLUSH: 3.0000 mL | Freq: Two times a day (BID) | INTRAVENOUS | Status: DC

## 2022-06-08 MED ORDER — METOPROLOL SUCCINATE ER 25 MG PO TB24
25.0000 mg | ORAL_TABLET | Freq: Every day | ORAL | Status: DC
  Administered 2022-06-08 – 2022-06-09 (×2): 25 mg via ORAL
  Filled 2022-06-08 (×2): qty 1

## 2022-06-08 MED ORDER — FENTANYL CITRATE (PF) 100 MCG/2ML IJ SOLN
INTRAMUSCULAR | Status: DC | PRN
  Administered 2022-06-08: 25 ug via INTRAVENOUS

## 2022-06-08 MED ORDER — METOPROLOL SUCCINATE ER 25 MG PO TB24: 25.0000 mg | ORAL_TABLET | Freq: Every day | ORAL | Status: DC

## 2022-06-08 MED ORDER — ASPIRIN 81 MG PO TBEC
81.0000 mg | DELAYED_RELEASE_TABLET | Freq: Every day | ORAL | Status: DC
  Administered 2022-06-08 – 2022-06-09 (×2): 81 mg via ORAL
  Filled 2022-06-08 (×2): qty 1

## 2022-06-08 MED ORDER — SODIUM CHLORIDE 0.9 % WEIGHT BASED INFUSION: 1.0000 mL/kg/h | INTRAVENOUS | Status: AC

## 2022-06-08 SURGICAL SUPPLY — 11 items
CATH INFINITI JR4 5F (CATHETERS) IMPLANT
CATH OPTITORQUE TIG 4.0 5F (CATHETERS) IMPLANT
DEVICE RAD COMP TR BAND LRG (VASCULAR PRODUCTS) IMPLANT
ELECT DEFIB PAD ADLT CADENCE (PAD) IMPLANT
GLIDESHEATH SLEND A-KIT 6F 22G (SHEATH) IMPLANT
GUIDEWIRE INQWIRE 1.5J.035X260 (WIRE) IMPLANT
INQWIRE 1.5J .035X260CM (WIRE) ×1
KIT HEART LEFT (KITS) ×1 IMPLANT
PACK CARDIAC CATHETERIZATION (CUSTOM PROCEDURE TRAY) ×1 IMPLANT
TRANSDUCER W/STOPCOCK (MISCELLANEOUS) ×1 IMPLANT
TUBING CIL FLEX 10 FLL-RA (TUBING) ×1 IMPLANT

## 2022-06-08 NOTE — Interval H&P Note (Signed)
History and Physical Interval Note:  06/08/2022 4:45 PM  [name] [name]  has presented today for surgery, with the diagnosis of chest pain.  The various methods of treatment have been discussed with the patient and family. After consideration of risks, benefits and other options for treatment, the patient has consented to  Procedure(s): LEFT HEART CATH AND CORONARY ANGIOGRAPHY (N/A) and possible angioplasty as a surgical intervention.  The patient's history has been reviewed, patient examined, no change in status, stable for surgery.  I have reviewed the patient's chart and labs.  Questions were answered to the patient's satisfaction.   Cath Lab Visit (complete for each Cath Lab visit)  Clinical Evaluation Leading to the Procedure:   ACS: Yes.    Non-ACS:    Anginal Classification: CCS IV  Anti-ischemic medical therapy: Minimal Therapy (1 class of medications)  Non-Invasive Test Results: No non-invasive testing performed  Prior CABG: No previous CABG  Adrian Prows

## 2022-06-08 NOTE — Progress Notes (Signed)
Report called and given to [place], RN on 6E. Patient transported.

## 2022-06-08 NOTE — Progress Notes (Signed)
TR BAND REMOVAL  LOCATION:    right radial  DEFLATED PER PROTOCOL:    Yes.    TIME BAND OFF / DRESSING APPLIED: 06/08/22 at [place] ARRIVAL:    Level 0  SITE AFTER BAND REMOVAL:    Level 0  CIRCULATION SENSATION AND MOVEMENT:    Within Normal Limits   Yes.    COMMENTS:

## 2022-06-08 NOTE — Progress Notes (Signed)
Dr Einar Gip notified of client c/o 8/10 chest pressure substernal and orders noted

## 2022-06-08 NOTE — H&P (View-Only) (Signed)
Patient referred by No ref. provider found for hypertension  Subjective:   Sharrie Rothman, female    DOB: 29-Jan-1971, 51 y.o.   MRN: [id number]   Chief Complaint  Patient presents with   Chest Pain    HPI  51 y.o. African-American female with hypertension, OSA  Patient made an urgent visit today due to ongoing chest, back, and left arm pain for past 1 week.  Pain is worse with activity, associated with nausea.  Initial consultation visit: Patient is originally from [place].  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  She developed hypertension after the birth of her third child, first delivery while in the [place], and has been on medications ever since.  She was on losartan 100 mg daily for long time, which was switched to olmesartan 40 mg daily only 3 weeks ago by her PCP, due to inadequate blood pressure control.  Patient reports that she has significant family history of hypertension in multiple family members.  She has 1 brother who died in his 41s due to some cardiac problem.  Patient is unable to provide details, but mentions that he had "the flu".  Patient denies any significant exertional chest pain or shortness of breath symptoms.  She occasionally has retrosternal chest pain, usually at rest, that lasts only for few seconds.  She does not drink alcohol, drinks caffeinated drink only occasionally.  She does not use any NSAIDs.  Patient cooks traditional food at home, does not use additional salt.  She usually drinks vegetables and organic diet.    Current Outpatient Medications:    amLODipine (NORVASC) 5 MG tablet, Take 1 tablet (5 mg total) by mouth daily., Disp: 90 tablet, Rfl: 3   cholecalciferol (VITAMIN D3) 25 MCG (1000 UNIT) tablet, Take 1,000 Units by mouth daily., Disp: , Rfl:    olmesartan-hydrochlorothiazide (BENICAR HCT) 40-25 MG tablet, Take 1 tablet by mouth daily., Disp: 90 tablet, Rfl: 3   vitamin  E 1000 UNIT capsule, Take 1,000 Units by mouth daily., Disp: , Rfl:     Cardiovascular and other pertinent studies:  EKG  06/08/2022: Sinus rhythm 85 bpm Low voltage in precordial leads Nonspecific ST-T abnormality with early repolarization changes in precordial leads These changes are slightly more prominent to previous EKG, but not new  Echocardiogram 05/04/2021:  Left ventricle cavity is normal in size. Moderate concentric hypertrophy  of the left ventricle. Normal global wall motion. Normal LV systolic  function with visual EF 50-55%. Doppler evidence of grade I (impaired)  diastolic dysfunction, normal LAP.  Left atrial cavity is moderately dilated at 4 cm. Aneurysmal interatrial  septum without 2D or color Doppler evidence of shunting.  Mild (Grade I) mitral regurgitation.  Mild tricuspid regurgitation. Estimated pulmonary artery systolic pressure  24 mmHg.  Renal artery duplex  05/04/2021:  No evidence of renal artery occlusive disease in either renal artery.  Normal intrarenal vascular perfusion is noted in both kidneys.  Renal length is within normal limits for both kidneys.  No plaque observed in the abdominal aorta. Normal abdominal aorta flow  velocities noted.  EKG 04/27/2021: Sinus rhythm 71 bpm Low voltage in precordial leads Poor R-wave progression  Stress test 2018: There was no ST segment deviation noted during stress. This is a low risk study. The study is normal.   Normal Lexiscan nuclear stress test with no evidence for prior infarct or ischemia. LVEF not  evaluated.   Recent labs: 04/11/2021: Glucose 83, BUN/Cr 11/0.62. EGFR 108. Na/K 140/4.1. Rest of the CMP normal H/H 11.1/36.2. MCV 83.6. Platelets 186 HbA1C 5.1% Chol 210, TG 43, HDL 101, LDL 95 TSH 1.8 normal   Latest Reference Range & Units 04/28/21 09:42  ALDOSTERONE 0.0 - 30.0 ng/dL 13.7  Renin 0.167 - 5.380 ng/mL/hr 0.530  ALDOS/RENIN RATIO 0.0 - 30.0  25.8    Review of Systems   Cardiovascular:  Positive for chest pain. Negative for dyspnea on exertion, leg swelling, palpitations and syncope.         Vitals:   06/08/22 1215  BP: (!) 141/93  Pulse: 90  SpO2: 98%     Body mass index is 37.45 kg/m. Filed Weights   06/08/22 1215  Weight: 232 lb (105.2 kg)      Objective:   Physical Exam Vitals and nursing note reviewed.  Constitutional:      General: She is not in acute distress. Neck:     Vascular: No JVD.  Cardiovascular:     Rate and Rhythm: Normal rate and regular rhythm.     Heart sounds: Normal heart sounds. No murmur heard. Pulmonary:     Effort: Pulmonary effort is normal.     Breath sounds: Normal breath sounds. No wheezing or rales.  Musculoskeletal:     Right lower leg: No edema.     Left lower leg: No edema.         Assessment & Recommendations:   51 y.o. African-American female with hypertension, OSA  Chest pain: Ongoing intermittent chest, back, shoulder pain for past 1 week, worse with activity and associated nausea.  Symptoms are concerning for unstable angina.  EKG has some subtle ST ST abnormalities that do not meet STEMI criteria.  However, constellation of her symptoms and EKG is concerning for unstable angina. Recommend urgent coronary angiography and possible intervention.  Schedule for cardiac catheterization, and possible angioplasty. We discussed regarding risks, benefits, alternatives to this including stress testing, CTA and continued medical therapy. Patient wants to proceed. Understands <1-2% risk of death, stroke, MI, urgent CABG, bleeding, infection, renal failure but not limited to these.   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  No secondary cause identified. Fairly well controlled on olmesartan-HCTZ 40-25 mg, amlodipine 5 mg daily.     Nigel Mormon, MD Pager: [phone] Office: [phone]

## 2022-06-08 NOTE — Progress Notes (Addendum)
Patient referred by No ref. provider found for hypertension  Subjective:   [name] [name], female    DOB: 01-04-1971, 51 y.o.   MRN: [id number]   Chief Complaint  Patient presents with   Chest Pain    HPI  51 y.o. African-American female with hypertension, OSA  Patient made an urgent visit today due to ongoing chest, back, and left arm pain for past 1 week.  Pain is worse with activity, associated with nausea.  Initial consultation visit: Patient is originally from [place].  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  She developed hypertension after the birth of her third child, first delivery while in the [place], and has been on medications ever since.  She was on losartan 100 mg daily for long time, which was switched to olmesartan 40 mg daily only 3 weeks ago by her PCP, due to inadequate blood pressure control.  Patient reports that she has significant family history of hypertension in multiple family members.  She has 1 brother who died in his 19s due to some cardiac problem.  Patient is unable to provide details, but mentions that he had "the flu".  Patient denies any significant exertional chest pain or shortness of breath symptoms.  She occasionally has retrosternal chest pain, usually at rest, that lasts only for few seconds.  She does not drink alcohol, drinks caffeinated drink only occasionally.  She does not use any NSAIDs.  Patient cooks traditional food at home, does not use additional salt.  She usually drinks vegetables and organic diet.    Current Outpatient Medications:    amLODipine (NORVASC) 5 MG tablet, Take 1 tablet (5 mg total) by mouth daily., Disp: 90 tablet, Rfl: 3   cholecalciferol (VITAMIN D3) 25 MCG (1000 UNIT) tablet, Take 1,000 Units by mouth daily., Disp: , Rfl:    olmesartan-hydrochlorothiazide (BENICAR HCT) 40-25 MG tablet, Take 1 tablet by mouth daily., Disp: 90 tablet, Rfl: 3   vitamin  E 1000 UNIT capsule, Take 1,000 Units by mouth daily., Disp: , Rfl:     Cardiovascular and other pertinent studies:  EKG  06/08/2022: Sinus rhythm 85 bpm Low voltage in precordial leads Nonspecific ST-T abnormality with early repolarization changes in precordial leads These changes are slightly more prominent to previous EKG, but not new  Echocardiogram 05/04/2021:  Left ventricle cavity is normal in size. Moderate concentric hypertrophy  of the left ventricle. Normal global wall motion. Normal LV systolic  function with visual EF 50-55%. Doppler evidence of grade I (impaired)  diastolic dysfunction, normal LAP.  Left atrial cavity is moderately dilated at 4 cm. Aneurysmal interatrial  septum without 2D or color Doppler evidence of shunting.  Mild (Grade I) mitral regurgitation.  Mild tricuspid regurgitation. Estimated pulmonary artery systolic pressure  24 mmHg.  Renal artery duplex  05/04/2021:  No evidence of renal artery occlusive disease in either renal artery.  Normal intrarenal vascular perfusion is noted in both kidneys.  Renal length is within normal limits for both kidneys.  No plaque observed in the abdominal aorta. Normal abdominal aorta flow  velocities noted.  EKG 04/27/2021: Sinus rhythm 71 bpm Low voltage in precordial leads Poor R-wave progression  Stress test 2018: There was no ST segment deviation noted during stress. This is a low risk study. The study is normal.   Normal Lexiscan nuclear stress test with no evidence for prior infarct or ischemia. LVEF not  evaluated.   Recent labs: 04/11/2021: Glucose 83, BUN/Cr 11/0.62. EGFR 108. Na/K 140/4.1. Rest of the CMP normal H/H 11.1/36.2. MCV 83.6. Platelets 186 HbA1C 5.1% Chol 210, TG 43, HDL 101, LDL 95 TSH 1.8 normal   Latest Reference Range & Units 04/28/21 09:42  ALDOSTERONE 0.0 - 30.0 ng/dL 13.7  Renin 0.167 - 5.380 ng/mL/hr 0.530  ALDOS/RENIN RATIO 0.0 - 30.0  25.8    Review of Systems   Cardiovascular:  Positive for chest pain. Negative for dyspnea on exertion, leg swelling, palpitations and syncope.         Vitals:   06/08/22 1215  BP: (!) 141/93  Pulse: 90  SpO2: 98%     Body mass index is 37.45 kg/m. Filed Weights   06/08/22 1215  Weight: 232 lb (105.2 kg)      Objective:   Physical Exam Vitals and nursing note reviewed.  Constitutional:      General: She is not in acute distress. Neck:     Vascular: No JVD.  Cardiovascular:     Rate and Rhythm: Normal rate and regular rhythm.     Heart sounds: Normal heart sounds. No murmur heard. Pulmonary:     Effort: Pulmonary effort is normal.     Breath sounds: Normal breath sounds. No wheezing or rales.  Musculoskeletal:     Right lower leg: No edema.     Left lower leg: No edema.         Assessment & Recommendations:   51 y.o. African-American female with hypertension, OSA  Chest pain: Ongoing intermittent chest, back, shoulder pain for past 1 week, worse with activity and associated nausea.  Symptoms are concerning for unstable angina.  EKG has some subtle ST ST abnormalities that do not meet STEMI criteria.  However, constellation of her symptoms and EKG is concerning for unstable angina. Recommend urgent coronary angiography and possible intervention.  Schedule for cardiac catheterization, and possible angioplasty. We discussed regarding risks, benefits, alternatives to this including stress testing, CTA and continued medical therapy. Patient wants to proceed. Understands <1-2% risk of death, stroke, MI, urgent CABG, bleeding, infection, renal failure but not limited to these.   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  No secondary cause identified. Fairly well controlled on olmesartan-HCTZ 40-25 mg, amlodipine 5 mg daily.     Nigel Mormon, MD Pager: [phone] Office: [phone]

## 2022-06-09 ENCOUNTER — Encounter (HOSPITAL_COMMUNITY): Payer: Self-pay | Admitting: Cardiology

## 2022-06-09 ENCOUNTER — Encounter: Payer: Self-pay | Admitting: Cardiology

## 2022-06-09 DIAGNOSIS — I25110 Atherosclerotic heart disease of native coronary artery with unstable angina pectoris: Secondary | ICD-10-CM | POA: Diagnosis not present

## 2022-06-09 DIAGNOSIS — E7800 Pure hypercholesterolemia, unspecified: Secondary | ICD-10-CM

## 2022-06-09 MED ORDER — METOPROLOL SUCCINATE ER 25 MG PO TB24: 25.0000 mg | ORAL_TABLET | Freq: Every day | ORAL | 1 refills | Status: DC

## 2022-06-09 MED ORDER — ORAL CARE MOUTH RINSE: 15.0000 mL | OROMUCOSAL | Status: DC | PRN

## 2022-06-09 MED ORDER — ISOSORBIDE MONONITRATE ER 30 MG PO TB24: 30.0000 mg | ORAL_TABLET | Freq: Every day | ORAL | 2 refills | Status: DC

## 2022-06-09 MED ORDER — CLOPIDOGREL BISULFATE 75 MG PO TABS: 75.0000 mg | ORAL_TABLET | Freq: Every day | ORAL | 2 refills | Status: DC

## 2022-06-09 NOTE — Progress Notes (Signed)
Mobility Specialist - Progress Note   06/09/22 0940  Mobility  Activity Ambulated with assistance in hallway  Level of Assistance Standby assist, set-up cues, supervision of patient - no hands on  Assistive Device None  Distance Ambulated (ft) 470 ft  Activity Response Tolerated well  Mobility Referral Yes  $Mobility charge 1 Mobility    During mobility:85 HR Post-mobility: HR  Pt was received in bed and agreeable to mobility. Pt c/o slight headache during ambulation but had no other symptoms. Pt was returned to bed with all needs met.  Franki Monte  Mobility Specialist Please contact via Solicitor or Rehab office at [phone]

## 2022-06-09 NOTE — Discharge Instructions (Signed)
For chest heaviness with radiation to the left arm, please use sublingual nitroglycerin as needed every 5 minutes, if you are to use >2 tablets in 5 to 10 minutes, if the chest pain is not resolved, you need to call us or call [phone] immediately.  Do not return to work until 06/19/2022.

## 2022-06-09 NOTE — Progress Notes (Signed)
Pt safely discharged. Discharge packet provided with teach-back method. VS wnL and as per flow. IVs removed, Pt verbalized understanding. All questions and concerns addressed. Awaiting on volunteering. Husband downstairs at pick up point.

## 2022-06-09 NOTE — Discharge Summary (Signed)
Physician Discharge Summary  Patient ID: [name] [name] MRN: [id number] DOB/AGE: June 03, 1971 51 y.o. Pcp, No   Admit date: 06/08/2022 Discharge date: 06/09/2022  Primary Discharge Diagnosis Unstable angina pectoris Primary hypertension Mild hypercholesterolemia Moderate obesity  Significant Diagnostic Studies:  Left Heart Catheterization 06/08/22:  LV: 133/-1, EDP 6 mmHg.  Ao 121/73, mean 92 mmHg.  No pressure gradient across the aortic valve.  LVEF 60% with no regional wall motion abnormality. LM: Large-caliber vessel.  Smooth and normal. LAD: Very large caliber vessel.  Gives origin to large D1 and D2 in the proximal segment very close to each other.  Proximal LAD has mild 10 to 15% stenosis.  D2 has a ostial/proximal 30% stenosis in the mid 50% stenosis.  Mid LAD has a 50 to 60% stenosis and appears hazy and may represent unstable plaque.  TIMI-3 flow in the LAD. CX: Gives origin to a moderate-sized OM1 and small OM 2.  AV groove branch is small. RCA: Dominant vessel.  Large PDA and PL branches.  PL branch is severely tortuous in the distal segment.  Otherwise no significant disease.    Impression: Unstable angina due to unstable plaque in the mid LAD.  However there is TIMI-3 flow noted and lesion appears relatively stable, will admit the patient for observation unless she has recurrence of chest pain, will do conservative therapy.  Hospital Course: [name] [name] is a 51 y.o. female  patient with hypertension, obesity, questionable family history of premature coronary disease, her brother died in his 35s due to cardiac problems, patient is from [place] and works as a Secretary/administrator at approximately  She was previously evaluated for sharp chest pain felt to be musculoskeletal, but presented to the office yesterday with heaviness and tightness in the chest with radiation to left arm with EKG abnormalities with ST changes in the anterior leads suggestive of unstable angina pectoris.  Patient  sent over for urgent cardiac catheterization, revealing stable chronic of 60% and mid LAD but had TIMI-3 flow and felt stable to be managed medically and is admitted for overnight observation.  Patient has had occasional episodes of sharp chest pain but states that she has not had any further tightness in the chest with radiation to the left arm.  She has mild headache from the nitroglycerin.  Otherwise states that she is doing well.  Recommendations on discharge: She can be discharged home today, advised her not to return to work for 1 week until 06/19/2022.  S/L NTG was prescribed and explained how to and when to use it and to notify us if there is change in frequency of use.   She was started on Imdur, metoprolol succinate, continued aspirin, Plavix, rosuvastatin 10 mg upon discharge and she will resume home medications.  Olmesartan HCT 40/25 mg was reduced to 1/2 tablet in view of soft blood pressure.  LPA has been ordered and needs follow-up.  Discharge Exam:    06/09/2022    8:22 AM 06/09/2022    6:33 AM 06/09/2022   12:00 AM  Vitals with BMI  Systolic 902 409 735  Diastolic 71 74 68  Pulse 61 69 82     Physical Exam Constitutional:      Appearance: She is obese.  Neck:     Vascular: No carotid bruit or JVD.  Cardiovascular:     Rate and Rhythm: Normal rate and regular rhythm.     Pulses: Intact distal pulses.     Heart sounds: Normal heart sounds. No murmur heard.  No gallop.  Pulmonary:     Effort: Pulmonary effort is normal.     Breath sounds: Normal breath sounds.  Abdominal:     General: Bowel sounds are normal.     Palpations: Abdomen is soft.  Musculoskeletal:     Right lower leg: No edema.     Left lower leg: No edema.    Right radial site has healed well. Labs:   Lab Results  Component Value Date   WBC 3.9 (L) 06/08/2022   HGB 13.2 06/08/2022   HCT 39.2 06/08/2022   MCV 93.6 06/08/2022   PLT 199 06/08/2022    Recent Labs  Lab 06/08/22 1450  NA  139  K 3.1*  CL 103  CO2 28  BUN 10  CREATININE 0.72  CALCIUM 10.0  GLUCOSE 78    Lipid Panel     Component Value Date/Time   CHOL 199 06/08/2022 2157   CHOL 189 12/10/2019 1133   TRIG 43 06/08/2022 2157   HDL 84 06/08/2022 2157   HDL 95 12/10/2019 1133   CHOLHDL 2.4 06/08/2022 2157   VLDL 9 06/08/2022 2157   LDLCALC 106 (H) 06/08/2022 2157   LDLCALC 86 12/10/2019 1133    BNP (last 3 results) No results for input(s): "BNP" in the last 8760 hours.  HEMOGLOBIN A1C Lab Results  Component Value Date   HGBA1C 5.0 08/19/2016    Cardiac Panel (last 3 results) Recent Labs    06/08/22 2157  TROPONINIHS 10     TSH No results for input(s): "TSH" in the last 8760 hours.  FOLLOW UP PLANS AND APPOINTMENTS  Allergies as of 06/09/2022       Reactions   Hctz [hydrochlorothiazide]    Heart palpitations- pt is unsure of this        Medication List     TAKE these medications    amLODipine 5 MG tablet Commonly known as: NORVASC Take 1 tablet (5 mg total) by mouth daily.   aspirin 81 MG chewable tablet Commonly known as: Aspirin Childrens Chew 1 tablet (81 mg total) by mouth daily.   cholecalciferol 25 MCG (1000 UNIT) tablet Commonly known as: VITAMIN D3 Take 1,000 Units by mouth daily.   clopidogrel 75 MG tablet Commonly known as: Plavix Take 1 tablet (75 mg total) by mouth daily.   FISH OIL OMEGA-3 PO Take 1 capsule by mouth daily in the afternoon.   isosorbide mononitrate 30 MG 24 hr tablet Commonly known as: IMDUR Take 1 tablet (30 mg total) by mouth daily.   metoprolol succinate 25 MG 24 hr tablet Commonly known as: TOPROL-XL Take 1 tablet (25 mg total) by mouth daily.   olmesartan-hydrochlorothiazide 40-25 MG tablet Commonly known as: BENICAR HCT Take 1 tablet by mouth daily.   pantoprazole 40 MG tablet Commonly known as: Protonix Take 1 tablet (40 mg total) by mouth daily before breakfast.   rosuvastatin 10 MG tablet Commonly known as:  Crestor Take 1 tablet (10 mg total) by mouth daily.   vitamin E 1000 UNIT capsule Take 1,000 Units by mouth daily.        Follow-up Information     Patwardhan, Reynold Bowen, MD. Call.   Specialties: Cardiology, Radiology Why: To be seen in 2-3 weeks Contact information: [place] [phone]                  Adrian Prows, MD, [facility] 06/09/2022, 9:28 AM Office: [phone]

## 2022-06-10 LAB — LIPOPROTEIN A (LPA): Lipoprotein (a): 66.1 nmol/L — ABNORMAL HIGH (ref <75.0)

## 2022-06-23 ENCOUNTER — Ambulatory Visit: Payer: No Typology Code available for payment source | Admitting: Cardiology

## 2022-06-23 ENCOUNTER — Encounter: Payer: Self-pay | Admitting: Cardiology

## 2022-06-23 VITALS — BP 124/84 | HR 87 | Resp 16 | Ht 66.0 in | Wt 232.0 lb

## 2022-06-23 DIAGNOSIS — I25118 Atherosclerotic heart disease of native coronary artery with other forms of angina pectoris: Secondary | ICD-10-CM | POA: Insufficient documentation

## 2022-06-23 DIAGNOSIS — I10 Essential (primary) hypertension: Secondary | ICD-10-CM

## 2022-06-23 DIAGNOSIS — I2510 Atherosclerotic heart disease of native coronary artery without angina pectoris: Secondary | ICD-10-CM | POA: Insufficient documentation

## 2022-06-23 DIAGNOSIS — E782 Mixed hyperlipidemia: Secondary | ICD-10-CM

## 2022-06-23 DIAGNOSIS — G4733 Obstructive sleep apnea (adult) (pediatric): Secondary | ICD-10-CM

## 2022-06-23 MED ORDER — PANTOPRAZOLE SODIUM 40 MG PO TBEC: 40.0000 mg | DELAYED_RELEASE_TABLET | Freq: Every day | ORAL | 0 refills | Status: DC

## 2022-06-23 MED ORDER — OLMESARTAN MEDOXOMIL-HCTZ 40-25 MG PO TABS: 1.0000 | ORAL_TABLET | ORAL | 3 refills | Status: DC

## 2022-06-23 MED ORDER — ROSUVASTATIN CALCIUM 20 MG PO TABS: 20.0000 mg | ORAL_TABLET | Freq: Every day | ORAL | 3 refills | Status: DC

## 2022-06-23 MED ORDER — ISOSORBIDE MONONITRATE ER 30 MG PO TB24: 30.0000 mg | ORAL_TABLET | Freq: Every day | ORAL | 3 refills | Status: DC

## 2022-06-23 MED ORDER — METOPROLOL SUCCINATE ER 25 MG PO TB24: 25.0000 mg | ORAL_TABLET | Freq: Every day | ORAL | 3 refills | Status: DC

## 2022-06-23 MED ORDER — ASPIRIN 81 MG PO TBEC: 81.0000 mg | DELAYED_RELEASE_TABLET | Freq: Every day | ORAL | 3 refills | Status: DC

## 2022-06-23 MED ORDER — CLOPIDOGREL BISULFATE 75 MG PO TABS: 75.0000 mg | ORAL_TABLET | Freq: Every day | ORAL | 0 refills | Status: DC

## 2022-06-23 NOTE — Progress Notes (Addendum)
Patient referred by No ref. provider found for hypertension  Subjective:   [name] [name], female    DOB: [birth year], 51 y.o.   MRN: [id number]   Chief Complaint  Patient presents with   Chest Pain   Hypertension   Follow-up    2 week    HPI  51 y.o. African-American female with hypertension, hyperlipidemia, OSA, coronary artery disease  Patient underwent coronary angiogram after an episode concerning for unstable angina that showed moderate's disease in mid LAD, but no severe flow-limiting lesions.  She was started on medical therapy.  Patient has not had any chest pain since then.  Blood pressure is well-controlled.  Initial consultation visit: Patient is originally from [place].  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  She developed hypertension after the birth of her third child, first delivery while in the [place], and has been on medications ever since.  She was on losartan 100 mg daily for long time, which was switched to olmesartan 40 mg daily only 3 weeks ago by her PCP, due to inadequate blood pressure control.  Patient reports that she has significant family history of hypertension in multiple family members.  She has 1 brother who died in his 28s due to some cardiac problem.  Patient is unable to provide details, but mentions that he had "the flu".  Patient denies any significant exertional chest pain or shortness of breath symptoms.  She occasionally has retrosternal chest pain, usually at rest, that lasts only for few seconds.  She does not drink alcohol, drinks caffeinated drink only occasionally.  She does not use any NSAIDs.  Patient cooks traditional food at home, does not use additional salt.  She usually drinks vegetables and organic diet.    Current Outpatient Medications:    amLODipine (NORVASC) 5 MG tablet, Take 1 tablet (5 mg total) by mouth daily., Disp: 90 tablet, Rfl: 3   aspirin (ASPIRIN  CHILDRENS) 81 MG chewable tablet, Chew 1 tablet (81 mg total) by mouth daily., Disp: , Rfl:    cholecalciferol (VITAMIN D3) 25 MCG (1000 UNIT) tablet, Take 1,000 Units by mouth daily., Disp: , Rfl:    clopidogrel (PLAVIX) 75 MG tablet, Take 1 tablet (75 mg total) by mouth daily., Disp: 30 tablet, Rfl: 2   isosorbide mononitrate (IMDUR) 30 MG 24 hr tablet, Take 1 tablet (30 mg total) by mouth daily., Disp: 30 tablet, Rfl: 2   metoprolol succinate (TOPROL-XL) 25 MG 24 hr tablet, Take 1 tablet (25 mg total) by mouth daily., Disp: 30 tablet, Rfl: 1   olmesartan-hydrochlorothiazide (BENICAR HCT) 40-25 MG tablet, Take 0.5 tablets by mouth every morning., Disp: 90 tablet, Rfl: 3   Omega-3 Fatty Acids (FISH OIL OMEGA-3 PO), Take 1 capsule by mouth daily in the afternoon., Disp: , Rfl:    pantoprazole (PROTONIX) 40 MG tablet, Take 1 tablet (40 mg total) by mouth daily before breakfast., Disp: 30 tablet, Rfl: 0   rosuvastatin (CRESTOR) 10 MG tablet, Take 1 tablet (10 mg total) by mouth daily., Disp: 30 tablet, Rfl: 11   vitamin E 1000 UNIT capsule, Take 1,000 Units by mouth daily., Disp: , Rfl:     Cardiovascular and other pertinent studies:  Coronary angiogram 06/08/2022: Left Heart Catheterization 06/08/22:  LV: 133/-1, EDP 6 mmHg.  Ao 121/73, mean 92 mmHg.  No pressure gradient across the aortic valve.  LVEF 60% with no regional wall motion  abnormality. LM: Large-caliber vessel.  Smooth and normal. LAD: Very large caliber vessel.  Gives origin to large D1 and D2 in the proximal segment very close to each other.  Proximal LAD has mild 10 to 15% stenosis.  D2 has a ostial/proximal 30% stenosis in the mid 50% stenosis.  Mid LAD has a 50 to 60% stenosis and appears hazy and may represent unstable plaque.  TIMI-3 flow in the LAD. CX: Gives origin to a moderate-sized OM1 and small OM 2.  AV groove branch is small. RCA: Dominant vessel.  Large PDA and PL branches.  PL branch is severely tortuous in the distal  segment.  Otherwise no significant disease.    Impression: Unstable angina due to unstable plaque in the mid LAD.  However there is TIMI-3 flow noted and lesion appears relatively stable, will admit the patient for observation unless she has recurrence of chest pain, will do conservative therapy.  EKG  06/08/2022: Sinus rhythm 85 bpm Low voltage in precordial leads Nonspecific ST-T abnormality with early repolarization changes in precordial leads These changes are slightly more prominent to previous EKG, but not new  Echocardiogram 05/04/2021:  Left ventricle cavity is normal in size. Moderate concentric hypertrophy  of the left ventricle. Normal global wall motion. Normal LV systolic  function with visual EF 50-55%. Doppler evidence of grade I (impaired)  diastolic dysfunction, normal LAP.  Left atrial cavity is moderately dilated at 4 cm. Aneurysmal interatrial  septum without 2D or color Doppler evidence of shunting.  Mild (Grade I) mitral regurgitation.  Mild tricuspid regurgitation. Estimated pulmonary artery systolic pressure  24 mmHg.  Renal artery duplex  05/04/2021:  No evidence of renal artery occlusive disease in either renal artery.  Normal intrarenal vascular perfusion is noted in both kidneys.  Renal length is within normal limits for both kidneys.  No plaque observed in the abdominal aorta. Normal abdominal aorta flow  velocities noted.  EKG 04/27/2021: Sinus rhythm 71 bpm Low voltage in precordial leads Poor R-wave progression  Stress test 2018: There was no ST segment deviation noted during stress. This is a low risk study. The study is normal.   Normal Lexiscan nuclear stress test with no evidence for prior infarct or ischemia. LVEF not evaluated.   Recent labs: 06/08/2022: Glucose 78, BUN/Cr 10/0.72. EGFR >60. Na/K 139/3.1.  H/H 13/39. MCV 93. Platelets 199 Chol 199, TG 43, HDL 84, LDL 106 Liporotein (a) 66  04/11/2021: Glucose 83, BUN/Cr 11/0.62.  EGFR 108. Na/K 140/4.1. Rest of the CMP normal H/H 11.1/36.2. MCV 83.6. Platelets 186 HbA1C 5.1% Chol 210, TG 43, HDL 101, LDL 95 TSH 1.8 normal Normal renin/aldosterone    Review of Systems  Cardiovascular:  Negative for chest pain, dyspnea on exertion, leg swelling, palpitations and syncope.         Vitals:   06/23/22 1326  BP: 124/84  Pulse: 87  Resp: 16  SpO2: 98%     Body mass index is 37.45 kg/m. Filed Weights   06/23/22 1326  Weight: 232 lb (105.2 kg)      Objective:   Physical Exam Vitals and nursing note reviewed.  Constitutional:      General: She is not in acute distress. Neck:     Vascular: No JVD.  Cardiovascular:     Rate and Rhythm: Normal rate and regular rhythm.     Heart sounds: Normal heart sounds. No murmur heard. Pulmonary:     Effort: Pulmonary effort is normal.     Breath  sounds: Normal breath sounds. No wheezing or rales.  Musculoskeletal:     Right lower leg: No edema.     Left lower leg: No edema.         Assessment & Recommendations:    51 y.o. African-American female with hypertension, hyperlipidemia, OSA, coronary artery disease  Coronary artery disease: Episode of unstable angina in 08/2021. Recommend aspirin, Plavix for 3 months, can discontinue Plavix. Increase rosuvastatin to 20 mg daily.  Lipid panel in 3 months. Continue metoprolol succinate 25 mg daily., Imdur 30 mg daily.  Hypertension: Well-controlled.  She is also taking 1 full tablet of olmesartan to cervicothoracic.  Continue the same, in addition to other medications as listed above.   F/u in 3 months   Nigel Mormon, MD Pager: [phone] Office: [phone]

## 2022-08-31 LAB — BASIC METABOLIC PANEL
BUN/Creatinine Ratio: 14 (ref 9–23)
BUN: 10 mg/dL (ref 6–24)
CO2: 23 mmol/L (ref 20–29)
Calcium: 9.4 mg/dL (ref 8.7–10.2)
Chloride: 104 mmol/L (ref 96–106)
Creatinine, Ser: 0.73 mg/dL (ref 0.57–1.00)
Glucose: 92 mg/dL (ref 70–99)
Potassium: 4.4 mmol/L (ref 3.5–5.2)
Sodium: 143 mmol/L (ref 134–144)
eGFR: 98 mL/min/{1.73_m2} (ref >59)

## 2022-08-31 LAB — LIPID PANEL
Chol/HDL Ratio: 1.5 ratio (ref 0.0–4.4)
Cholesterol, Total: 143 mg/dL (ref 100–199)
HDL: 94 mg/dL (ref >39)
LDL Chol Calc (NIH): 39 mg/dL (ref 0–99)
Triglycerides: 40 mg/dL (ref 0–149)
VLDL Cholesterol Cal: 10 mg/dL (ref 5–40)

## 2022-09-22 ENCOUNTER — Encounter: Payer: Self-pay | Admitting: Cardiology

## 2022-09-22 ENCOUNTER — Ambulatory Visit: Payer: No Typology Code available for payment source | Admitting: Cardiology

## 2022-09-22 VITALS — BP 117/83 | HR 81 | Resp 16 | Ht 66.0 in | Wt 229.0 lb

## 2022-09-22 DIAGNOSIS — I2510 Atherosclerotic heart disease of native coronary artery without angina pectoris: Secondary | ICD-10-CM

## 2022-09-22 DIAGNOSIS — I10 Essential (primary) hypertension: Secondary | ICD-10-CM

## 2022-09-22 MED ORDER — ASPIRIN 81 MG PO TBEC: 81.0000 mg | DELAYED_RELEASE_TABLET | Freq: Every day | ORAL | 3 refills | Status: DC

## 2022-09-22 MED ORDER — ROSUVASTATIN CALCIUM 20 MG PO TABS: 20.0000 mg | ORAL_TABLET | Freq: Every day | ORAL | 3 refills | Status: DC

## 2022-09-22 MED ORDER — AMLODIPINE BESYLATE 5 MG PO TABS: 5.0000 mg | ORAL_TABLET | Freq: Every day | ORAL | 3 refills | Status: AC

## 2022-09-22 MED ORDER — OLMESARTAN MEDOXOMIL-HCTZ 40-25 MG PO TABS: 1.0000 | ORAL_TABLET | ORAL | 3 refills | Status: DC

## 2022-09-22 MED ORDER — METOPROLOL SUCCINATE ER 25 MG PO TB24: 25.0000 mg | ORAL_TABLET | Freq: Every day | ORAL | 3 refills | Status: DC

## 2022-09-22 NOTE — Progress Notes (Signed)
Patient referred by No ref. provider found for hypertension  Subjective:   [name] [name], female    DOB: [birth year], 52 y.o.   MRN: [id number]   Chief Complaint  Patient presents with   Coronary Artery Disease   Follow-up    HPI  52 y.o. African-American female with hypertension, hyperlipidemia, OSA, coronary artery disease  Patient underwent coronary angiogram after an episode concerning for unstable angina in 06/2022, that showed moderate's disease in mid LAD, but no severe flow-limiting lesions.  She was started on medical therapy.  Patient has not had any chest pain since then.  Blood pressure is well-controlled.  Reviewed recent test results with the patient, details below.   Initial consultation visit: Patient is originally from [place].  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  She developed hypertension after the birth of her third child, first delivery while in the [place], and has been on medications ever since.  She was on losartan 100 mg daily for long time, which was switched to olmesartan 40 mg daily only 3 weeks ago by her PCP, due to inadequate blood pressure control.  Patient reports that she has significant family history of hypertension in multiple family members.  She has 1 brother who died in his 50s due to some cardiac problem.  Patient is unable to provide details, but mentions that he had "the flu".  Patient denies any significant exertional chest pain or shortness of breath symptoms.  She occasionally has retrosternal chest pain, usually at rest, that lasts only for few seconds.  She does not drink alcohol, drinks caffeinated drink only occasionally.  She does not use any NSAIDs.  Patient cooks traditional food at home, does not use additional salt.  She usually drinks vegetables and organic diet.    Current Outpatient Medications:    amLODipine (NORVASC) 5 MG tablet, Take 1 tablet (5 mg total)  by mouth daily., Disp: 90 tablet, Rfl: 3   aspirin EC 81 MG tablet, Take 1 tablet (81 mg total) by mouth daily. Swallow whole., Disp: 90 tablet, Rfl: 3   cholecalciferol (VITAMIN D3) 25 MCG (1000 UNIT) tablet, Take 1,000 Units by mouth daily., Disp: , Rfl:    metoprolol succinate (TOPROL-XL) 25 MG 24 hr tablet, Take 1 tablet (25 mg total) by mouth daily., Disp: 90 tablet, Rfl: 3   olmesartan-hydrochlorothiazide (BENICAR HCT) 40-25 MG tablet, Take 1 tablet by mouth every morning., Disp: 90 tablet, Rfl: 3   rosuvastatin (CRESTOR) 20 MG tablet, Take 1 tablet (20 mg total) by mouth daily., Disp: 90 tablet, Rfl: 3   vitamin E 1000 UNIT capsule, Take 1,000 Units by mouth daily., Disp: , Rfl:    clopidogrel (PLAVIX) 75 MG tablet, Take 1 tablet (75 mg total) by mouth daily. (Patient not taking: Reported on 09/22/2022), Disp: 90 tablet, Rfl: 0   isosorbide mononitrate (IMDUR) 30 MG 24 hr tablet, Take 1 tablet (30 mg total) by mouth daily. (Patient not taking: Reported on 09/22/2022), Disp: 90 tablet, Rfl: 3   Omega-3 Fatty Acids (FISH OIL OMEGA-3 PO), Take 1 capsule by mouth daily in the afternoon. (Patient not taking: Reported on 09/22/2022), Disp: , Rfl:    pantoprazole (PROTONIX) 40 MG tablet, Take 1 tablet (40 mg total) by mouth daily before breakfast. (Patient not taking: Reported on 09/22/2022), Disp: 90 tablet, Rfl: 0    Cardiovascular and other pertinent studies:  Coronary angiogram 06/08/2022: Left  Heart Catheterization 06/08/22:  LV: 133/-1, EDP 6 mmHg.  Ao 121/73, mean 92 mmHg.  No pressure gradient across the aortic valve.  LVEF 60% with no regional wall motion abnormality. LM: Large-caliber vessel.  Smooth and normal. LAD: Very large caliber vessel.  Gives origin to large D1 and D2 in the proximal segment very close to each other.  Proximal LAD has mild 10 to 15% stenosis.  D2 has a ostial/proximal 30% stenosis in the mid 50% stenosis.  Mid LAD has a 50 to 60% stenosis and appears hazy and may  represent unstable plaque.  TIMI-3 flow in the LAD. CX: Gives origin to a moderate-sized OM1 and small OM 2.  AV groove branch is small. RCA: Dominant vessel.  Large PDA and PL branches.  PL branch is severely tortuous in the distal segment.  Otherwise no significant disease.    Impression: Unstable angina due to unstable plaque in the mid LAD.  However there is TIMI-3 flow noted and lesion appears relatively stable, will admit the patient for observation unless she has recurrence of chest pain, will do conservative therapy.  EKG  06/08/2022: Sinus rhythm 85 bpm Low voltage in precordial leads Nonspecific ST-T abnormality with early repolarization changes in precordial leads These changes are slightly more prominent to previous EKG, but not new  Echocardiogram 05/04/2021:  Left ventricle cavity is normal in size. Moderate concentric hypertrophy  of the left ventricle. Normal global wall motion. Normal LV systolic  function with visual EF 50-55%. Doppler evidence of grade I (impaired)  diastolic dysfunction, normal LAP.  Left atrial cavity is moderately dilated at 4 cm. Aneurysmal interatrial  septum without 2D or color Doppler evidence of shunting.  Mild (Grade I) mitral regurgitation.  Mild tricuspid regurgitation. Estimated pulmonary artery systolic pressure  24 mmHg.  Renal artery duplex  05/04/2021:  No evidence of renal artery occlusive disease in either renal artery.  Normal intrarenal vascular perfusion is noted in both kidneys.  Renal length is within normal limits for both kidneys.  No plaque observed in the abdominal aorta. Normal abdominal aorta flow  velocities noted.  EKG 04/27/2021: Sinus rhythm 71 bpm Low voltage in precordial leads Poor R-wave progression  Stress test 2018: There was no ST segment deviation noted during stress. This is a low risk study. The study is normal.   Normal Lexiscan nuclear stress test with no evidence for prior infarct or  ischemia. LVEF not evaluated.   Recent labs: 08/30/2022: Glucose 92, BUN/Cr 10/0.73. EGFR 98. Na/K 143/4.4. Chol 143, TG 40, HDL 94, LDL 39  06/08/2022: Glucose 78, BUN/Cr 10/0.72. EGFR >60. Na/K 139/3.1.  H/H 13/39. MCV 93. Platelets 199 Chol 199, TG 43, HDL 84, LDL 106 Liporotein (a) 66  04/11/2021: Glucose 83, BUN/Cr 11/0.62. EGFR 108. Na/K 140/4.1. Rest of the CMP normal H/H 11.1/36.2. MCV 83.6. Platelets 186 HbA1C 5.1% Chol 210, TG 43, HDL 101, LDL 95 TSH 1.8 normal Normal renin/aldosterone    Review of Systems  Cardiovascular:  Negative for chest pain, dyspnea on exertion, leg swelling, palpitations and syncope.         Vitals:   09/22/22 1045 09/22/22 1051  BP: (!) 148/101 117/83  Pulse: 81 81  Resp: 16   SpO2: 99%      Body mass index is 36.96 kg/m. Filed Weights   09/22/22 1045  Weight: 229 lb (103.9 kg)      Objective:   Physical Exam Vitals and nursing note reviewed.  Constitutional:  General: She is not in acute distress. Neck:     Vascular: No JVD.  Cardiovascular:     Rate and Rhythm: Normal rate and regular rhythm.     Heart sounds: Normal heart sounds. No murmur heard. Pulmonary:     Effort: Pulmonary effort is normal.     Breath sounds: Normal breath sounds. No wheezing or rales.  Musculoskeletal:     Right lower leg: No edema.     Left lower leg: No edema.         Assessment & Recommendations:    52 y.o. African-American female with hypertension, hyperlipidemia, OSA, coronary artery disease  Coronary artery disease: Episode of unstable angina in 06/2022. Nonobstructive CAD on cath 06/2022. Continue Aspirin 81 mg daily, rosuvastatin 20 mg daily, metoprolol succinate 25 mg daily, amlodipine 5 mg daily. Chol 143, TG 40, HDL 94, LDL 39 (08/2022).  Hypertension: Well-controlled.   F/u in 1 year    Nigel Mormon, MD Pager: [phone] Office: [phone]

## 2022-11-24 ENCOUNTER — Encounter (HOSPITAL_COMMUNITY): Payer: Self-pay | Admitting: Emergency Medicine

## 2022-11-24 ENCOUNTER — Ambulatory Visit (HOSPITAL_COMMUNITY)
Admission: EM | Admit: 2022-11-24 | Discharge: 2022-11-24 | Disposition: A | Discharge status: Home / Self Care | Payer: No Typology Code available for payment source | Attending: Family Medicine | Admitting: Family Medicine

## 2022-11-24 DIAGNOSIS — Z1152 Encounter for screening for COVID-19: Secondary | ICD-10-CM | POA: Diagnosis not present

## 2022-11-24 DIAGNOSIS — J029 Acute pharyngitis, unspecified: Secondary | ICD-10-CM | POA: Insufficient documentation

## 2022-11-24 DIAGNOSIS — B9789 Other viral agents as the cause of diseases classified elsewhere: Secondary | ICD-10-CM | POA: Insufficient documentation

## 2022-11-24 DIAGNOSIS — J069 Acute upper respiratory infection, unspecified: Secondary | ICD-10-CM | POA: Insufficient documentation

## 2022-11-24 LAB — POCT RAPID STREP A (OFFICE): Rapid Strep A Screen: NEGATIVE

## 2022-11-24 MED ORDER — KETOROLAC TROMETHAMINE 30 MG/ML IJ SOLN
30.0000 mg | Freq: Once | INTRAMUSCULAR | Status: AC
  Administered 2022-11-24: 30 mg via INTRAMUSCULAR

## 2022-11-24 MED ORDER — IBUPROFEN 800 MG PO TABS: 800.0000 mg | ORAL_TABLET | Freq: Three times a day (TID) | ORAL | 0 refills | Status: DC | PRN

## 2022-11-24 MED ORDER — KETOROLAC TROMETHAMINE 30 MG/ML IJ SOLN
INTRAMUSCULAR | Status: AC
  Filled 2022-11-24: qty 1

## 2022-11-24 MED ORDER — BENZONATATE 100 MG PO CAPS: 100.0000 mg | ORAL_CAPSULE | Freq: Three times a day (TID) | ORAL | 0 refills | Status: AC | PRN

## 2022-11-24 NOTE — ED Provider Notes (Signed)
[facility]    CSN: [id number] Arrival date & time: 11/24/22  1130      History   Chief Complaint Chief Complaint  Patient presents with   Sore Throat   Headache   Generalized Body Aches    HPI [name] [name] is a 52 y.o. female.    Sore Throat Associated symptoms include headaches.  Headache  Here for myalgia and subjective fever and chills and headache.  Symptoms began on May 21, then yesterday she began having a lot of sore throat.  She has had very slight cough.  She is not allergic to any antibiotics  Last menstrual cycle was about 3 years ago. Past Medical History:  Diagnosis Date   Anemia    Fatty liver    H. pylori infection    HTN (hypertension)    Hx of cardiovascular stress test    a. ETT-Myoview 05/28/12: Low risk study with a small, mild fixed basal inferior perfusion defect most likely representing soft tissue attenuation with inferolateral HK (wall motion abnormality does not correlate with inferior perfusion defect), EF 49%.   Hx of echocardiogram    a. Echocardiogram 06/11/12: EF 50-55%, normal wall motion, grade 1 diastolic dysfunction, trivial AI, mild MR, mild LAE, mild RAE.   RUQ pain     Patient Active Problem List   Diagnosis Date Noted   Mixed hyperlipidemia 06/23/2022   Coronary artery disease involving native coronary artery of native heart without angina pectoris 06/23/2022   Hypercholesteremia 06/09/2022   Precordial pain 06/08/2022   Nonspecific abnormal electrocardiogram (ECG) (EKG) 06/08/2022   Unstable angina (HCC) 06/08/2022   Coronary artery disease involving native coronary artery of native heart with unstable angina pectoris (HCC) 06/08/2022   OSA (obstructive sleep apnea) 05/25/2021   Contusion of right elbow 06/10/2018   Right elbow pain 06/10/2018   Lumbar strain, initial encounter 09/18/2017   Lumbar pain 09/18/2017   Lung nodule 08/26/2016   PVC (premature ventricular contraction) 05/26/2014    Diverticulosis 04/14/2014   GERD (gastroesophageal reflux disease) 11/19/2012   Chest pain 05/21/2012   Obesity, morbid (more than 100 lbs over ideal weight or BMI > 40) (HCC) 05/30/2011   Essential hypertension 04/29/2009    Past Surgical History:  Procedure Laterality Date   BREAST BIOPSY Left 03/09/2020   LEFT HEART CATH AND CORONARY ANGIOGRAPHY N/A 06/08/2022   Procedure: LEFT HEART CATH AND CORONARY ANGIOGRAPHY;  Surgeon: Yates Decamp, MD;  Location: [facility];  Service: Cardiovascular;  Laterality: N/A;   NO PAST SURGERIES     UPPER GASTROINTESTINAL ENDOSCOPY      OB History     Gravida  5   Para  4   Term  4   Preterm      AB  1   Living  4      SAB  1   IAB      Ectopic      Multiple      Live Births  4            Home Medications    Prior to Admission medications   Medication Sig Start Date End Date Taking? Authorizing Provider  benzonatate (TESSALON) 100 MG capsule Take 1 capsule (100 mg total) by mouth 3 (three) times daily as needed for cough. 11/24/22  Yes Zenia Resides, MD  ibuprofen (ADVIL) 800 MG tablet Take 1 tablet (800 mg total) by mouth every 8 (eight) hours as needed (pain). 11/24/22  Yes Loreta Ave  K, MD  amLODipine (NORVASC) 5 MG tablet Take 1 tablet (5 mg total) by mouth daily. 09/22/22   Patwardhan, Anabel Bene, MD  metoprolol succinate (TOPROL-XL) 25 MG 24 hr tablet Take 1 tablet (25 mg total) by mouth daily. 09/22/22   Patwardhan, Anabel Bene, MD  olmesartan-hydrochlorothiazide (BENICAR HCT) 40-25 MG tablet Take 1 tablet by mouth every morning. 09/22/22   Patwardhan, Manish J, MD  rosuvastatin (CRESTOR) 20 MG tablet Take 1 tablet (20 mg total) by mouth daily. 09/22/22 09/22/23  Elder Negus, MD    Family History Family History  Problem Relation Age of Onset   Coronary artery disease Mother    Other Brother        fluid around heart   Heart disease Brother    Other Other        hepatic issues-? maternal side    Stomach cancer Neg Hx    Colon cancer Neg Hx    Esophageal cancer Neg Hx    Rectal cancer Neg Hx     Social History Social History   Tobacco Use   Smoking status: Never   Smokeless tobacco: Never  Vaping Use   Vaping Use: Never used  Substance Use Topics   Alcohol use: No   Drug use: No     Allergies   Diltiazem and Hctz [hydrochlorothiazide]   Review of Systems Review of Systems  Neurological:  Positive for headaches.     Physical Exam Triage Vital Signs ED Triage Vitals  Enc Vitals Group     BP 11/24/22 1148 (!) 138/97     Pulse Rate 11/24/22 1148 92     Resp 11/24/22 1148 18     Temp 11/24/22 1148 99.1 F (37.3 C)     Temp Source 11/24/22 1148 Oral     SpO2 11/24/22 1148 97 %     Weight --      Height --      Head Circumference --      Peak Flow --      Pain Score 11/24/22 1146 6     Pain Loc --      Pain Edu? --      Excl. in GC? --    No data found.  Updated Vital Signs BP (!) 138/97 (BP Location: Right Arm)   Pulse 92   Temp 99.1 F (37.3 C) (Oral)   Resp 18   LMP 12/13/2019   SpO2 97%   Visual Acuity Right Eye Distance:   Left Eye Distance:   Bilateral Distance:    Right Eye Near:   Left Eye Near:    Bilateral Near:     Physical Exam Vitals reviewed.  Constitutional:      General: She is not in acute distress.    Appearance: She is not toxic-appearing.  HENT:     Right Ear: Tympanic membrane and ear canal normal.     Left Ear: Tympanic membrane and ear canal normal.     Nose: Congestion present.     Mouth/Throat:     Mouth: Mucous membranes are moist.     Comments: There is erythema of the posterior oropharynx.  There is no asymmetry Eyes:     Extraocular Movements: Extraocular movements intact.     Conjunctiva/sclera: Conjunctivae normal.     Pupils: Pupils are equal, round, and reactive to light.  Cardiovascular:     Rate and Rhythm: Normal rate and regular rhythm.     Heart sounds: No murmur heard. Pulmonary:  Effort: Pulmonary effort is normal. No respiratory distress.     Breath sounds: No stridor. No wheezing, rhonchi or rales.  Musculoskeletal:     Cervical back: Neck supple.  Lymphadenopathy:     Cervical: No cervical adenopathy.  Skin:    Capillary Refill: Capillary refill takes less than 2 seconds.     Coloration: Skin is not jaundiced or pale.  Neurological:     General: No focal deficit present.     Mental Status: She is alert and oriented to person, place, and time.  Psychiatric:        Behavior: Behavior normal.      UC Treatments / Results  Labs (all labs ordered are listed, but only abnormal results are displayed) Labs Reviewed  CULTURE, GROUP A STREP ([facility])  SARS CORONAVIRUS 2 (TAT 6-24 HRS)  POCT RAPID STREP A (OFFICE)    EKG   Radiology No results found.  Procedures Procedures (including critical care time)  Medications Ordered in UC Medications  ketorolac (TORADOL) 30 MG/ML injection 30 mg (has no administration in time range)    Initial Impression / Assessment and Plan / UC Course  I have reviewed the triage vital signs and the nursing notes.  Pertinent labs & imaging results that were available during my care of the patient were reviewed by me and considered in my medical decision making (see chart for details).        Rapid strep is negative.  Culture is sent, and we will treat per protocol if positive   COVID swab was done, and if positive she is a candidate for Paxlovid.  Her last EGFR in February of this year was 32.   Final Clinical Impressions(s) / UC Diagnoses   Final diagnoses:  Sore throat  Viral upper respiratory tract infection     Discharge Instructions      Your strep test is negative.  Culture of the throat will be sent, and staff will notify you if that is in turn positive.   You have been swabbed for COVID, and the test will result in the next 24 hours. Our staff will call you if positive. If the COVID test is  positive, you should quarantine until you are fever free for 24 hours and you are starting to feel better, and then take added precautions for the next 5 days, such as physical distancing/wearing a mask and good hand hygiene/washing. If your MyChart sends you a message that you have a positive COVID test, please call in to our facility tomorrow.  We do not have a callback nurse on weekends.   You have been given a shot of Toradol 30 mg today.  Take benzonatate 100 mg, 1 tab every 8 hours as needed for cough.  Take ibuprofen 800 mg--1 tab every 8 hours as needed for pain.  You can also take Mucinex as needed for the congestion.     ED Prescriptions     Medication Sig Dispense Auth. Provider   ibuprofen (ADVIL) 800 MG tablet Take 1 tablet (800 mg total) by mouth every 8 (eight) hours as needed (pain). 21 tablet Reshard Guillet, Janace Aris, MD   benzonatate (TESSALON) 100 MG capsule Take 1 capsule (100 mg total) by mouth 3 (three) times daily as needed for cough. 21 capsule Zenia Resides, MD      PDMP not reviewed this encounter.   Zenia Resides, MD 11/24/22 1235

## 2022-11-24 NOTE — ED Triage Notes (Signed)
Pt c/o cough, congestion, bilateral ear, sore throat, chills, and headache for 3 days. Took aleve yesterday for pain.

## 2022-11-24 NOTE — Discharge Instructions (Addendum)
Your strep test is negative.  Culture of the throat will be sent, and staff will notify you if that is in turn positive.   You have been swabbed for COVID, and the test will result in the next 24 hours. Our staff will call you if positive. If the COVID test is positive, you should quarantine until you are fever free for 24 hours and you are starting to feel better, and then take added precautions for the next 5 days, such as physical distancing/wearing a mask and good hand hygiene/washing. If your MyChart sends you a message that you have a positive COVID test, please call in to our facility tomorrow.  We do not have a callback nurse on weekends.   You have been given a shot of Toradol 30 mg today.  Take benzonatate 100 mg, 1 tab every 8 hours as needed for cough.  Take ibuprofen 800 mg--1 tab every 8 hours as needed for pain.  You can also take Mucinex as needed for the congestion.

## 2022-11-25 LAB — SARS CORONAVIRUS 2 (TAT 6-24 HRS): SARS Coronavirus 2: NEGATIVE

## 2022-11-27 ENCOUNTER — Other Ambulatory Visit: Payer: Self-pay

## 2022-11-27 ENCOUNTER — Encounter (HOSPITAL_COMMUNITY): Payer: Self-pay | Admitting: *Deleted

## 2022-11-27 ENCOUNTER — Ambulatory Visit (HOSPITAL_COMMUNITY)
Admission: EM | Admit: 2022-11-27 | Discharge: 2022-11-27 | Disposition: A | Discharge status: Home / Self Care | Payer: No Typology Code available for payment source | Attending: Physician Assistant | Admitting: Physician Assistant

## 2022-11-27 DIAGNOSIS — H66002 Acute suppurative otitis media without spontaneous rupture of ear drum, left ear: Secondary | ICD-10-CM | POA: Diagnosis not present

## 2022-11-27 DIAGNOSIS — J039 Acute tonsillitis, unspecified: Secondary | ICD-10-CM | POA: Diagnosis not present

## 2022-11-27 LAB — CULTURE, GROUP A STREP (THRC)

## 2022-11-27 LAB — POCT RAPID STREP A (OFFICE): Rapid Strep A Screen: NEGATIVE

## 2022-11-27 LAB — POCT MONO SCREEN (KUC): Mono, POC: NEGATIVE

## 2022-11-27 MED ORDER — AMOXICILLIN-POT CLAVULANATE 875-125 MG PO TABS: 1.0000 | ORAL_TABLET | Freq: Two times a day (BID) | ORAL | 0 refills | Status: DC

## 2022-11-27 NOTE — ED Triage Notes (Signed)
Pt reports she was seen on 11/24/22 at [facility] for sore throat. Pt reports her sore throat is worse. Pt reports a difficult time swallowing.

## 2022-11-27 NOTE — ED Provider Notes (Signed)
[facility]    CSN: [id number] Arrival date & time: 11/27/22  1409      History   Chief Complaint Chief Complaint  Patient presents with   Sore Throat    HPI [name] [name] is a 52 y.o. female.   Patient presents today for weeklong history of URI symptoms including severe sore throat.  She was seen on 11/24/2022 with similar symptoms at which point she was prescribed ibuprofen 800 milligrams and Tessalon.  She reports that despite medication she has had worsening symptoms and she is now having difficulty swallowing as a result of the pain.  She reports that sore throat pain is rated 10 on a 0-10 pain scale, worse on the left, described as sharp, worse with swallowing.  She is also developed left otalgia.  Denies any recent antibiotics in the past 90 days.  During her last visit she tested negative for strep and had a negative throat culture.  She also tested negative for COVID-19.  She has had some mild cough but is unsure if this is triggered by the irritation in her throat or underlying URI.  She reports that she is feeling very poorly and having difficulty with daily activities.  She is able to manage her secretions but eating/drinking is difficult as a result of the pain.  She denies any muffled voice, swelling of her throat, shortness of breath.    Past Medical History:  Diagnosis Date   Anemia    Fatty liver    H. pylori infection    HTN (hypertension)    Hx of cardiovascular stress test    a. ETT-Myoview 05/28/12: Low risk study with a small, mild fixed basal inferior perfusion defect most likely representing soft tissue attenuation with inferolateral HK (wall motion abnormality does not correlate with inferior perfusion defect), EF 49%.   Hx of echocardiogram    a. Echocardiogram 06/11/12: EF 50-55%, normal wall motion, grade 1 diastolic dysfunction, trivial AI, mild MR, mild LAE, mild RAE.   RUQ pain     Patient Active Problem List   Diagnosis Date Noted    Mixed hyperlipidemia 06/23/2022   Coronary artery disease involving native coronary artery of native heart without angina pectoris 06/23/2022   Hypercholesteremia 06/09/2022   Precordial pain 06/08/2022   Nonspecific abnormal electrocardiogram (ECG) (EKG) 06/08/2022   Unstable angina (HCC) 06/08/2022   Coronary artery disease involving native coronary artery of native heart with unstable angina pectoris (HCC) 06/08/2022   OSA (obstructive sleep apnea) 05/25/2021   Contusion of right elbow 06/10/2018   Right elbow pain 06/10/2018   Lumbar strain, initial encounter 09/18/2017   Lumbar pain 09/18/2017   Lung nodule 08/26/2016   PVC (premature ventricular contraction) 05/26/2014   Diverticulosis 04/14/2014   GERD (gastroesophageal reflux disease) 11/19/2012   Chest pain 05/21/2012   Obesity, morbid (more than 100 lbs over ideal weight or BMI > 40) (HCC) 05/30/2011   Essential hypertension 04/29/2009    Past Surgical History:  Procedure Laterality Date   BREAST BIOPSY Left 03/09/2020   LEFT HEART CATH AND CORONARY ANGIOGRAPHY N/A 06/08/2022   Procedure: LEFT HEART CATH AND CORONARY ANGIOGRAPHY;  Surgeon: Yates Decamp, MD;  Location: [facility];  Service: Cardiovascular;  Laterality: N/A;   NO PAST SURGERIES     UPPER GASTROINTESTINAL ENDOSCOPY      OB History     Gravida  5   Para  4   Term  4   Preterm  AB  1   Living  4      SAB  1   IAB      Ectopic      Multiple      Live Births  4            Home Medications    Prior to Admission medications   Medication Sig Start Date End Date Taking? Authorizing Provider  amoxicillin-clavulanate (AUGMENTIN) 875-125 MG tablet Take 1 tablet by mouth every 12 (twelve) hours. 11/27/22  Yes Keontay Vora K, PA-C  amLODipine (NORVASC) 5 MG tablet Take 1 tablet (5 mg total) by mouth daily. 09/22/22   Patwardhan, Anabel Bene, MD  benzonatate (TESSALON) 100 MG capsule Take 1 capsule (100 mg total) by mouth 3 (three)  times daily as needed for cough. 11/24/22   Zenia Resides, MD  ibuprofen (ADVIL) 800 MG tablet Take 1 tablet (800 mg total) by mouth every 8 (eight) hours as needed (pain). 11/24/22   Zenia Resides, MD  metoprolol succinate (TOPROL-XL) 25 MG 24 hr tablet Take 1 tablet (25 mg total) by mouth daily. 09/22/22   Patwardhan, Anabel Bene, MD  olmesartan-hydrochlorothiazide (BENICAR HCT) 40-25 MG tablet Take 1 tablet by mouth every morning. 09/22/22   Patwardhan, Manish J, MD  rosuvastatin (CRESTOR) 20 MG tablet Take 1 tablet (20 mg total) by mouth daily. 09/22/22 09/22/23  Elder Negus, MD    Family History Family History  Problem Relation Age of Onset   Coronary artery disease Mother    Other Brother        fluid around heart   Heart disease Brother    Other Other        hepatic issues-? maternal side   Stomach cancer Neg Hx    Colon cancer Neg Hx    Esophageal cancer Neg Hx    Rectal cancer Neg Hx     Social History Social History   Tobacco Use   Smoking status: Never   Smokeless tobacco: Never  Vaping Use   Vaping Use: Never used  Substance Use Topics   Alcohol use: No   Drug use: No     Allergies   Diltiazem and Hctz [hydrochlorothiazide]   Review of Systems Review of Systems  Constitutional:  Positive for activity change. Negative for appetite change, fatigue and fever.  HENT:  Positive for ear pain, sore throat and trouble swallowing. Negative for congestion, sinus pressure, sneezing and voice change.   Respiratory:  Positive for cough. Negative for shortness of breath.   Cardiovascular:  Negative for chest pain.  Gastrointestinal:  Negative for abdominal pain, diarrhea, nausea and vomiting.  Neurological:  Negative for dizziness, light-headedness and headaches.     Physical Exam Triage Vital Signs ED Triage Vitals  Enc Vitals Group     BP 11/27/22 1445 (!) 128/95     Pulse Rate 11/27/22 1445 97     Resp 11/27/22 1445 18     Temp 11/27/22 1445 99.5  F (37.5 C)     Temp src --      SpO2 11/27/22 1445 96 %     Weight --      Height --      Head Circumference --      Peak Flow --      Pain Score 11/27/22 1442 10     Pain Loc --      Pain Edu? --      Excl. in GC? --    No  data found.  Updated Vital Signs BP (!) 128/95   Pulse 97   Temp 99.5 F (37.5 C)   Resp 18   LMP 12/13/2019   SpO2 96%   Visual Acuity Right Eye Distance:   Left Eye Distance:   Bilateral Distance:    Right Eye Near:   Left Eye Near:    Bilateral Near:     Physical Exam Vitals reviewed.  Constitutional:      General: She is awake. She is not in acute distress.    Appearance: Normal appearance. She is well-developed. She is not ill-appearing.     Comments: Very pleasant female appears stated age in no acute distress sitting comfortably in exam room  HENT:     Head: Normocephalic and atraumatic.     Right Ear: Tympanic membrane, ear canal and external ear normal. Tympanic membrane is not erythematous or bulging.     Left Ear: Ear canal and external ear normal. Tympanic membrane is erythematous and retracted. Tympanic membrane is not bulging.     Nose:     Right Sinus: No maxillary sinus tenderness or frontal sinus tenderness.     Left Sinus: Maxillary sinus tenderness and frontal sinus tenderness present.     Mouth/Throat:     Pharynx: Uvula midline. Posterior oropharyngeal erythema present. No oropharyngeal exudate.     Tonsils: No tonsillar exudate. 1+ on the right. 1+ on the left.  Cardiovascular:     Rate and Rhythm: Normal rate and regular rhythm.     Heart sounds: Normal heart sounds, S1 normal and S2 normal. No murmur heard. Pulmonary:     Effort: Pulmonary effort is normal.     Breath sounds: Normal breath sounds. No wheezing, rhonchi or rales.     Comments: Clear to auscultation bilaterally Lymphadenopathy:     Head:     Right side of head: No submental, submandibular or tonsillar adenopathy.     Left side of head: Submandibular  adenopathy present. No submental or tonsillar adenopathy.     Cervical: No cervical adenopathy.  Psychiatric:        Behavior: Behavior is cooperative.      UC Treatments / Results  Labs (all labs ordered are listed, but only abnormal results are displayed) Labs Reviewed  POCT RAPID STREP A (OFFICE)  POCT MONO SCREEN ([facility])    EKG   Radiology No results found.  Procedures Procedures (including critical care time)  Medications Ordered in UC Medications - No data to display  Initial Impression / Assessment and Plan / UC Course  I have reviewed the triage vital signs and the nursing notes.  Pertinent labs & imaging results that were available during my care of the patient were reviewed by me and considered in my medical decision making (see chart for details).     Patient is well-appearing, afebrile, nontoxic, nontachycardic.  Strep testing was repeated and was negative.  Mono testing was also negative.  Throat culture was deferred as patient was treated with Augmentin for otitis media that was identified on physical exam and we discussed that this would also cover for strep pharyngitis.  She is to continue previously prescribed ibuprofen as well as over-the-counter Tylenol to manage her pain.  Also recommended gargle with warm salt water.  Discussed that if this is a bacterial infection that should improve with antibiotics within a few days and if it does not improve in this timeframe she should return for reevaluation.  Discussed that if she has any  worsening symptoms including dysphagia, muffled voice, swelling of her throat, high fever, shortness of breath she needs to go to the emergency room.  Strict return precautions given.  Work excuse note provided.  Final Clinical Impressions(s) / UC Diagnoses   Final diagnoses:  Non-recurrent acute suppurative otitis media of left ear without spontaneous rupture of tympanic membrane  Acute tonsillitis, unspecified etiology      Discharge Instructions      Your strep and mono testing were negative.  We are going to treat for an ear infection and infection of your tonsils.  Take Augmentin (amoxicillin/clavulanate) twice daily for 7 days.  Gargle with warm salt water and continue alternating Tylenol and ibuprofen for pain.  If your symptoms or not improving within a few days or if anything worsens and you get to the point that you cannot swallow, feel like your throat is swollen, cannot breathe, have a high fever not responding to medication you need to be seen immediately.     ED Prescriptions     Medication Sig Dispense Auth. Provider   amoxicillin-clavulanate (AUGMENTIN) 875-125 MG tablet Take 1 tablet by mouth every 12 (twelve) hours. 14 tablet Mickelle Goupil, Noberto Retort, PA-C      PDMP not reviewed this encounter.   Jeani Hawking, PA-C 11/27/22 1605

## 2022-11-27 NOTE — Discharge Instructions (Signed)
Your strep and mono testing were negative.  We are going to treat for an ear infection and infection of your tonsils.  Take Augmentin (amoxicillin/clavulanate) twice daily for 7 days.  Gargle with warm salt water and continue alternating Tylenol and ibuprofen for pain.  If your symptoms or not improving within a few days or if anything worsens and you get to the point that you cannot swallow, feel like your throat is swollen, cannot breathe, have a high fever not responding to medication you need to be seen immediately.

## 2023-06-10 ENCOUNTER — Other Ambulatory Visit: Payer: Self-pay | Admitting: Cardiology

## 2023-06-10 DIAGNOSIS — I2510 Atherosclerotic heart disease of native coronary artery without angina pectoris: Secondary | ICD-10-CM

## 2023-06-28 ENCOUNTER — Encounter: Payer: Self-pay | Admitting: Neurology

## 2023-09-07 LAB — CBC WITH DIFFERENTIAL/PLATELET
Basophils Absolute: 0 10*3/uL (ref 0.0–0.2)
Basos: 1 %
EOS (ABSOLUTE): 0.1 10*3/uL (ref 0.0–0.4)
Eos: 2 %
Hematocrit: 38.3 % (ref 34.0–46.6)
Hemoglobin: 12.7 g/dL (ref 11.1–15.9)
Immature Grans (Abs): 0 10*3/uL (ref 0.0–0.1)
Immature Granulocytes: 0 %
Lymphocytes Absolute: 1.6 10*3/uL (ref 0.7–3.1)
Lymphs: 48 %
MCH: 30.8 pg (ref 26.6–33.0)
MCHC: 33.2 g/dL (ref 31.5–35.7)
MCV: 93 fL (ref 79–97)
Monocytes Absolute: 0.3 10*3/uL (ref 0.1–0.9)
Monocytes: 9 %
Neutrophils Absolute: 1.3 10*3/uL — ABNORMAL LOW (ref 1.4–7.0)
Neutrophils: 40 %
Platelets: 202 10*3/uL (ref 150–450)
RBC: 4.13 x10E6/uL (ref 3.77–5.28)
RDW: 11.3 % — ABNORMAL LOW (ref 11.7–15.4)
WBC: 3.2 10*3/uL — ABNORMAL LOW (ref 3.4–10.8)

## 2023-09-07 LAB — COMPREHENSIVE METABOLIC PANEL
ALT: 20 IU/L (ref 0–32)
AST: 21 IU/L (ref 0–40)
Albumin: 4.3 g/dL (ref 3.8–4.9)
Alkaline Phosphatase: 74 IU/L (ref 44–121)
BUN/Creatinine Ratio: 13 (ref 9–23)
BUN: 10 mg/dL (ref 6–24)
Bilirubin Total: 0.4 mg/dL (ref 0.0–1.2)
CO2: 25 mmol/L (ref 20–29)
Calcium: 9.5 mg/dL (ref 8.7–10.2)
Chloride: 102 mmol/L (ref 96–106)
Creatinine, Ser: 0.75 mg/dL (ref 0.57–1.00)
Globulin, Total: 2.9 g/dL (ref 1.5–4.5)
Glucose: 78 mg/dL (ref 70–99)
Potassium: 4.1 mmol/L (ref 3.5–5.2)
Sodium: 142 mmol/L (ref 134–144)
Total Protein: 7.2 g/dL (ref 6.0–8.5)
eGFR: 95 mL/min/{1.73_m2} (ref >59)

## 2023-09-07 LAB — LIPID PANEL
Chol/HDL Ratio: 1.8 ratio (ref 0.0–4.4)
Cholesterol, Total: 166 mg/dL (ref 100–199)
HDL: 90 mg/dL (ref >39)
LDL Chol Calc (NIH): 65 mg/dL (ref 0–99)
Triglycerides: 52 mg/dL (ref 0–149)
VLDL Cholesterol Cal: 11 mg/dL (ref 5–40)

## 2023-09-20 ENCOUNTER — Ambulatory Visit: Payer: Self-pay | Attending: Cardiology | Admitting: Cardiology

## 2023-09-20 ENCOUNTER — Encounter: Payer: Self-pay | Admitting: Cardiology

## 2023-09-20 VITALS — BP 152/90 | HR 68 | Resp 16 | Ht 66.0 in | Wt 235.0 lb

## 2023-09-20 DIAGNOSIS — I25118 Atherosclerotic heart disease of native coronary artery with other forms of angina pectoris: Secondary | ICD-10-CM

## 2023-09-20 DIAGNOSIS — E782 Mixed hyperlipidemia: Secondary | ICD-10-CM | POA: Diagnosis not present

## 2023-09-20 DIAGNOSIS — I10 Essential (primary) hypertension: Secondary | ICD-10-CM | POA: Diagnosis not present

## 2023-09-20 MED ORDER — ASPIRIN 81 MG PO TBEC: DELAYED_RELEASE_TABLET | ORAL | Status: AC

## 2023-09-20 NOTE — Patient Instructions (Signed)
 Medication Instructions:   START TAKING ASPIRIN 81 MG BY MOUTH THREE TIMES WEEKLY WITH FOOD  *If you need a refill on your cardiac medications before your next appointment, please call your pharmacy*     Follow-Up: At [facility], you and your health needs are our priority.  As part of our continuing mission to provide you with exceptional heart care, we have created designated Provider Care Teams.  These Care Teams include your primary Cardiologist (physician) and Advanced Practice Providers (APPs -  Physician Assistants and Nurse Practitioners) who all work together to provide you with the care you need, when you need it.  We recommend signing up for the patient portal called "MyChart".  Sign up information is provided on this After Visit Summary.  MyChart is used to connect with patients for Virtual Visits (Telemedicine).  Patients are able to view lab/test results, encounter notes, upcoming appointments, etc.  Non-urgent messages can be sent to your provider as well.   To learn more about what you can do with MyChart, go to [email or web address].    Your next appointment:   1 year(s)  Provider:   DR. Rosemary Holms   Other Instructions    1st Floor: - Lobby - Registration  - Pharmacy  - Lab - Cafe  2nd Floor: - PV Lab - Diagnostic Testing (echo, CT, nuclear med)  3rd Floor: - Vacant  4th Floor: - TCTS (cardiothoracic surgery) - AFib Clinic - Structural Heart Clinic - Vascular Surgery  - Vascular Ultrasound  5th Floor: - HeartCare Cardiology (general and EP) - Clinical Pharmacy for coumadin, hypertension, lipid, weight-loss medications, and med management appointments    Valet parking services will be available as well.

## 2023-09-20 NOTE — Progress Notes (Signed)
  Cardiology Office Note:  .   Date:  09/20/2023  ID:  [name] [name], DOB 23-Mar-1971, MRN [id number] PCP: Pcp, No  [facility] Providers Cardiologist:  Truett Mainland, MD PCP: Pcp, No  Chief Complaint  Patient presents with   Coronary Artery Disease   Hypertension   Follow-up      History of Present Illness: .    [name] [name] is a 53 y.o. female with hypertension, hyperlipidemia, OSA, coronary artery disease   Patient has been under a lot of financial stress lately.  She attributes her elevated blood pressure to stress.  She denies any chest pain, shortness of breath symptoms.  She is compliant with her medical therapy, except aspirin.  She was recently seen in the hospital for headache, and was recommended sumatriptan.  She is not using it currently, but has not had any recurrence of headache.   Vitals:   09/20/23 0945  BP: (!) 152/90  Pulse: 68  Resp: 16  SpO2: 97%     ROS:  Review of Systems  Cardiovascular:  Negative for chest pain, dyspnea on exertion, leg swelling, palpitations and syncope.     Studies Reviewed: Marland Kitchen        EKG 09/20/2023: Normal sinus rhythm Low voltage QRS Nonspecific T wave abnormality When compared with ECG of 08-Jun-2022 15:13, No significant change was found    Independently interpreted 09/2023: Chol 166, TG 52, HDL 90, LDL 65 Hb 12.7 Cr 6.29   Physical Exam:   Physical Exam Vitals and nursing note reviewed.  Constitutional:      General: She is not in acute distress. Neck:     Vascular: No JVD.  Cardiovascular:     Rate and Rhythm: Normal rate and regular rhythm.     Heart sounds: Normal heart sounds. No murmur heard. Pulmonary:     Effort: Pulmonary effort is normal.     Breath sounds: Normal breath sounds. No wheezing or rales.  Musculoskeletal:     Right lower leg: No edema.     Left lower leg: No edema.      VISIT DIAGNOSES:   ICD-10-CM   1. Coronary artery disease of native artery of native heart  with stable angina pectoris (HCC)  I25.118 EKG 12-Lead    2. Primary hypertension  I10     3. Mixed hyperlipidemia  E78.2        ASSESSMENT AND PLAN: .    [name] [name] is a 53 y.o. female with hypertension, hyperlipidemia, OSA, coronary artery disease   Coronary artery disease: Episode of unstable angina in 06/2022. Nonobstructive CAD on cath 06/2022. Recommend aspirin 81 mg daily at least couple times a week with food. Continue rosuvastatin 20 mg daily, metoprolol succinate 25 mg daily, amlodipine 5 mg daily. Chol 143, TG 40, HDL 94, LDL 39 (08/2022).   Hypertension: Blood pressure elevated today, attributed to stress.  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  She has regular follow-up with her PCP, other this can be addressed further.      F/u in 1 year  Signed, Elder Negus, MD

## 2023-10-16 ENCOUNTER — Other Ambulatory Visit: Payer: Self-pay | Admitting: Cardiology

## 2023-10-16 DIAGNOSIS — I2510 Atherosclerotic heart disease of native coronary artery without angina pectoris: Secondary | ICD-10-CM

## 2023-10-16 DIAGNOSIS — I10 Essential (primary) hypertension: Secondary | ICD-10-CM

## 2023-10-26 NOTE — Progress Notes (Unsigned)
 NEUROLOGY CONSULTATION NOTE  Kolette Nearing MRN: [id number] DOB: 1970/11/20  Referring provider: Osie Bleacher, NP Primary care provider: Osie Bleacher, NP  Reason for consult:  migraines  Assessment/Plan:   Possible cervicogenic headache Cervicalgia with left sided radiculopathy   Will check MRI of brain with and without contrast to rule out secondary intracranial etiology Refer to physical therapy for treatment of headache/neck pain.  If no improvement in 1 to 2 months, she will contact me. Limit use of pain relievers to no more than 9 days out of the month to prevent risk of rebound or medication-overuse headache. Keep headache diary Follow up 6 months.    Subjective:  [name] [name] is a 53 year old right-handed female with CAD, HTN, HLD and fatty liver who presents for migraines.  History supplemented by referring provider's note.  Onset:  approximately 2023. Location:  left posterior parietal/occipital, sometimes radiating to left ear and down left side of posterior neck Quality:  pressure Intensity:  8/10.  She denies new headache, thunderclap headache or severe headache that wakes her from sleep. Aura:  absent Prodrome:  absent Associated symptoms:  scalp tenderness, scalp paresthesia, bilateral tinnitus.  She denies associated nausea, vomiting, photophobia, phonophobia, visual disturbance, unilateral numbness or weakness. Duration:  all day Frequency:  3 days a week Frequency of abortive medication: 3 days a week. Triggers:  movement/activity, agitation Relieving factors:  somewhat responds Activity:  aggravates  Past NSAIDS/analgesics:  diclofenac  75mg , naproxen , tramadol  Past abortive triptans:  none Past abortive ergotamine:  none Past muscle relaxants:  Flexeril  Past anti-emetic:  none Past antihypertensive medications:  verapamil , losartan  Past antidepressant medications:  none Past anticonvulsant medications:  none Past anti-CGRP:  none Past  antihistamines/decongestants:  Zyrtec , Flonase , meclizine  Other past therapies:  none  Current NSAIDS/analgesics:  ASA 81mg  daily, Aleve  Current triptans:  sumatriptan  100mg  Current ergotamine:  none Current anti-emetic:  none Current muscle relaxants:  none Current Antihypertensive medications:  metoprolol  succinate XL 25mg  daily, olmesartan -hydrochlorothiazide , amlodipine  Current Antidepressant medications:  none Current Anticonvulsant medications:  none Current anti-CGRP:  none Current Antihistamines/Decongestants:  Azelastine NS Other therapy:  none Other medications:  rosuvastatin    Caffeine :  1 cup of coffee four times a week. Exercise:  no Depression:  no; Anxiety:  yes Sleep hygiene:  usually wakes up during the night  Other history:  many years ago, hit her head in the same area. Works in housekeeping Family history:  her mother has similar headache.  No family history of aneurysm.      PAST MEDICAL HISTORY: Past Medical History:  Diagnosis Date   Anemia    Fatty liver    H. pylori infection    HTN (hypertension)    Hx of cardiovascular stress test    a. ETT-Myoview  05/28/12: Low risk study with a small, mild fixed basal inferior perfusion defect most likely representing soft tissue attenuation with inferolateral HK (wall motion abnormality does not correlate with inferior perfusion defect), EF 49%.   Hx of echocardiogram    a. Echocardiogram 06/11/12: EF 50-55%, normal wall motion, grade 1 diastolic dysfunction, trivial AI, mild MR, mild LAE, mild RAE.   RUQ pain     PAST SURGICAL HISTORY: Past Surgical History:  Procedure Laterality Date   BREAST BIOPSY Left 03/09/2020   LEFT HEART CATH AND CORONARY ANGIOGRAPHY N/A 06/08/2022   Procedure: LEFT HEART CATH AND CORONARY ANGIOGRAPHY;  Surgeon: Knox Perl, MD;  Location: [facility];  Service: Cardiovascular;  Laterality: N/A;   NO PAST  SURGERIES     UPPER GASTROINTESTINAL ENDOSCOPY       MEDICATIONS: Current Outpatient Medications on File Prior to Visit  Medication Sig Dispense Refill   amLODipine  (NORVASC ) 5 MG tablet Take 1 tablet (5 mg total) by mouth daily. 90 tablet 3   aspirin  EC 81 MG tablet Take 1 tablet (81 mg total) by mouth three times weekly with food. Swallow whole.     benzonatate  (TESSALON ) 100 MG capsule Take 1 capsule (100 mg total) by mouth 3 (three) times daily as needed for cough. 21 capsule 0   metoprolol  succinate (TOPROL -XL) 25 MG 24 hr tablet TAKE 1 TABLET(25 MG) BY MOUTH DAILY 90 tablet 3   olmesartan -hydrochlorothiazide  (BENICAR  HCT) 40-25 MG tablet TAKE 1 TABLET BY MOUTH EVERY MORNING 90 tablet 3   rosuvastatin  (CRESTOR ) 20 MG tablet TAKE 1 TABLET(20 MG) BY MOUTH DAILY 90 tablet 0   SUMAtriptan  (IMITREX ) 100 MG tablet Take 100 mg by mouth daily as needed.     No current facility-administered medications on file prior to visit.    ALLERGIES: Allergies  Allergen Reactions   Diltiazem Swelling   Hctz [Hydrochlorothiazide ]     Heart palpitations- pt is unsure of this    FAMILY HISTORY: Family History  Problem Relation Age of Onset   Coronary artery disease Mother    Other Brother        fluid around heart   Heart disease Brother    Other Other        hepatic issues-? maternal side   Stomach cancer Neg Hx    Colon cancer Neg Hx    Esophageal cancer Neg Hx    Rectal cancer Neg Hx     Objective:  Blood pressure (!) 140/80, pulse (!) 59, height 5\' 4"  (1.626 m), weight 237 lb 3.2 oz (107.6 kg), last menstrual period 12/13/2019, SpO2 98%. General: No acute distress.  Patient appears well-groomed.   Head:  Normocephalic/atraumatic Eyes:  fundi examined but not visualized Neck: supple, left sided paraspinal tenderness, full range of motion Heart: regular rate and rhythm Neurological Exam: Mental status: alert and oriented to person, place, and time, speech fluent and not dysarthric, language intact. Cranial nerves: CN I: not  tested CN II: pupils equal, round and reactive to light, visual fields intact CN III, IV, VI:  full range of motion, no nystagmus, no ptosis CN V: facial sensation intact. CN VII: upper and lower face symmetric CN VIII: hearing intact CN IX, X: gag intact, uvula midline CN XI: sternocleidomastoid and trapezius muscles intact CN XII: tongue midline Bulk & Tone: normal, no fasciculations. Motor:  muscle strength 5/5 throughout Sensation:  Pinprick sensation reduced over dorsum of left hand and last 3 digits of left hand; vibratory sensation intact. Deep Tendon Reflexes:  2+ throughout,  toes downgoing.   Finger to nose testing:  Without dysmetria.   Gait:  Normal station and stride.  Romberg negative.    Thank you for allowing me to take part in the care of this patient.  Janne Members, DO  CC: Osie Bleacher, NP

## 2023-11-05 ENCOUNTER — Ambulatory Visit: Payer: No Typology Code available for payment source | Admitting: Neurology

## 2023-11-05 VITALS — BP 140/80 | HR 59 | Ht 64.0 in | Wt 237.2 lb

## 2023-11-05 DIAGNOSIS — M5412 Radiculopathy, cervical region: Secondary | ICD-10-CM

## 2023-11-05 DIAGNOSIS — R519 Headache, unspecified: Secondary | ICD-10-CM

## 2023-11-05 DIAGNOSIS — M542 Cervicalgia: Secondary | ICD-10-CM

## 2023-11-05 NOTE — Patient Instructions (Signed)
 MRI of brain with and without contrast Refer to physical therapy for treatment of headache, neck pain and left cervical radiculopathy.  If no improvement in 1 to 2 months, contact me Limit use of pain relievers to no more than 9 days out of the month to prevent risk of rebound or medication-overuse headache. Follow up 6 months.

## 2023-11-06 ENCOUNTER — Encounter: Payer: Self-pay | Admitting: Neurology

## 2023-12-08 ENCOUNTER — Ambulatory Visit
Admission: RE | Admit: 2023-12-08 | Discharge: 2023-12-08 | Disposition: A | Discharge status: Home / Self Care | Source: Ambulatory Visit | Attending: Neurology

## 2023-12-08 DIAGNOSIS — M542 Cervicalgia: Secondary | ICD-10-CM

## 2023-12-08 DIAGNOSIS — R519 Headache, unspecified: Secondary | ICD-10-CM

## 2023-12-08 MED ORDER — GADOPICLENOL 0.5 MMOL/ML IV SOLN
10.0000 mL | Freq: Once | INTRAVENOUS | Status: AC | PRN
  Administered 2023-12-08: 10 mL via INTRAVENOUS

## 2023-12-31 ENCOUNTER — Ambulatory Visit: Payer: Self-pay | Admitting: Neurology

## 2024-01-01 NOTE — Progress Notes (Signed)
 Patient advised.

## 2024-05-19 ENCOUNTER — Ambulatory Visit: Admitting: Neurology

## 2024-06-03 NOTE — Progress Notes (Unsigned)
 NEUROLOGY FOLLOW UP OFFICE NOTE  Khloi Rawl [id number]  Assessment/Plan:   Possible cervicogenic headache Cervicalgia with left sided radiculopathy   Will check MRI of brain with and without contrast to rule out secondary intracranial etiology Refer to physical therapy for treatment of headache/neck pain.  If no improvement in 1 to 2 months, she will contact me. Limit use of pain relievers to no more than 9 days out of the month to prevent risk of rebound or medication-overuse headache. Keep headache diary Follow up 6 months.    Subjective:  Alayza Tawil is a 53 year old right-handed female with CAD, HTN, HLD and fatty liver who follows up for migraines.  MRI of brain personally reviewed.  UPDATE: MRI of brain with and without contrast on 12/30/2023 revealed nonspecific moderate scattered T2/FLAIR hyperintensities within the cerebral white matter, likely chronic small vessel ischemic changes, but no acute intracranial abnormality.  She was referred to physical therapy for treatment of neck pain which may be contributing to headache.  ***  Intensity:  *** Duration:  *** Frequency:  *** Frequency of abortive medication: *** Current NSAIDS/analgesics:  ASA 81mg  daily, Aleve  Current triptans:  sumatriptan  100mg  Current ergotamine:  none Current anti-emetic:  none Current muscle relaxants:  none Current Antihypertensive medications:  metoprolol  succinate XL 25mg  daily, olmesartan -hydrochlorothiazide , amlodipine  Current Antidepressant medications:  none Current Anticonvulsant medications:  none Current anti-CGRP:  none Current Antihistamines/Decongestants:  Azelastine NS Other medications:  rosuvastatin    Caffeine :  1 cup of coffee four times a week. Exercise:  no Depression:  no; Anxiety:  yes Sleep hygiene:  usually wakes up during the night  HISTORY: Onset:  approximately 2023. Location:  left posterior parietal/occipital, sometimes radiating to left ear and  down left side of posterior neck Quality:  pressure Intensity:  8/10.  She denies new headache, thunderclap headache or severe headache that wakes her from sleep. Aura:  absent Prodrome:  absent Associated symptoms:  scalp tenderness, scalp paresthesia, bilateral tinnitus.  She denies associated nausea, vomiting, photophobia, phonophobia, visual disturbance, unilateral numbness or weakness. Duration:  all day Frequency:  3 days a week Frequency of abortive medication: 3 days a week. Triggers:  movement/activity, agitation Relieving factors:  somewhat responds Activity:  aggravates  Past NSAIDS/analgesics:  diclofenac  75mg , naproxen , tramadol  Past abortive triptans:  none Past abortive ergotamine:  none Past muscle relaxants:  Flexeril  Past anti-emetic:  none Past antihypertensive medications:  verapamil , losartan  Past antidepressant medications:  none Past anticonvulsant medications:  none Past anti-CGRP:  none Past antihistamines/decongestants:  Zyrtec , Flonase , meclizine  Other past therapies:  none    Other history:  many years ago, hit her head in the same area. Works in housekeeping Family history:  her mother has similar headache.  No family history of aneurysm.  PAST MEDICAL HISTORY: Past Medical History:  Diagnosis Date   Anemia    Fatty liver    H. pylori infection    HTN (hypertension)    Hx of cardiovascular stress test    a. ETT-Myoview  05/28/12: Low risk study with a small, mild fixed basal inferior perfusion defect most likely representing soft tissue attenuation with inferolateral HK (wall motion abnormality does not correlate with inferior perfusion defect), EF 49%.   Hx of echocardiogram    a. Echocardiogram 06/11/12: EF 50-55%, normal wall motion, grade 1 diastolic dysfunction, trivial AI, mild MR, mild LAE, mild RAE.   RUQ pain     MEDICATIONS: Current Outpatient Medications on File Prior to Visit  Medication Sig Dispense  Refill   amLODipine  (NORVASC )  5 MG tablet Take 1 tablet (5 mg total) by mouth daily. 90 tablet 3   aspirin  EC 81 MG tablet Take 1 tablet (81 mg total) by mouth three times weekly with food. Swallow whole.     benzonatate  (TESSALON ) 100 MG capsule Take 1 capsule (100 mg total) by mouth 3 (three) times daily as needed for cough. 21 capsule 0   metoprolol  succinate (TOPROL -XL) 25 MG 24 hr tablet TAKE 1 TABLET(25 MG) BY MOUTH DAILY 90 tablet 3   olmesartan -hydrochlorothiazide  (BENICAR  HCT) 40-25 MG tablet TAKE 1 TABLET BY MOUTH EVERY MORNING 90 tablet 3   rosuvastatin  (CRESTOR ) 20 MG tablet TAKE 1 TABLET(20 MG) BY MOUTH DAILY 90 tablet 0   SUMAtriptan  (IMITREX ) 100 MG tablet Take 100 mg by mouth daily as needed.     No current facility-administered medications on file prior to visit.    ALLERGIES: Allergies  Allergen Reactions   Diltiazem Swelling   Hctz [Hydrochlorothiazide ]     Heart palpitations- pt is unsure of this    FAMILY HISTORY: Family History  Problem Relation Age of Onset   Coronary artery disease Mother    Other Brother        fluid around heart   Heart disease Brother    Other Other        hepatic issues-? maternal side   Stomach cancer Neg Hx    Colon cancer Neg Hx    Esophageal cancer Neg Hx    Rectal cancer Neg Hx       Objective:  *** General: No acute distress.  Patient appears ***-groomed.   Head:  Normocephalic/atraumatic Eyes:  Fundi examined but not visualized Neck: supple, no paraspinal tenderness, full range of motion Heart:  Regular rate and rhythm Neurological Exam: alert and oriented.  Speech fluent and not dysarthric, language intact.  CN II-XII intact. Bulk and tone normal, muscle strength 5/5 throughout.  Sensation to light touch intact.  Deep tendon reflexes 2+ throughout, toes downgoing.  Finger to nose testing intact.  Gait normal, Romberg negative.   Juliene Dunnings, DO  CC: ***

## 2024-06-04 ENCOUNTER — Encounter: Payer: Self-pay | Admitting: Neurology

## 2024-06-04 ENCOUNTER — Ambulatory Visit: Admitting: Neurology

## 2024-06-04 VITALS — BP 139/77 | HR 70 | Ht 64.0 in | Wt 233.0 lb

## 2024-06-04 DIAGNOSIS — G4486 Cervicogenic headache: Secondary | ICD-10-CM

## 2024-06-04 MED ORDER — BACLOFEN 10 MG PO TABS: 10.0000 mg | ORAL_TABLET | Freq: Three times a day (TID) | ORAL | 5 refills | Status: AC | PRN

## 2024-06-04 MED ORDER — GABAPENTIN 100 MG PO CAPS: ORAL_CAPSULE | ORAL | 0 refills | Status: AC

## 2024-06-04 NOTE — Patient Instructions (Signed)
 Start gabapentin  1 pill twice daily for one week, then increase to 2 pills twice daily.  If no improvement in 5 weeks, contact me Stop sumatriptan .  If neck pain/headache flares up, take baclofen  10mg  - up to 3 times daily as needed.  Caution for drowsiness Find out from your insurance what the copay for physical therapy be starting in January - let me know if you would like us  to send a new order. Follow up 6 months.
